# Patient Record
Sex: Male | Born: 1955 | Race: Black or African American | Hispanic: No | Smoking: Former smoker
Health system: Southern US, Community
[De-identification: ages and names within clinical notes are randomized; demographics above are authoritative.]

## PROBLEM LIST (undated history)

## (undated) DIAGNOSIS — M1990 Unspecified osteoarthritis, unspecified site: Secondary | ICD-10-CM

## (undated) DIAGNOSIS — G8929 Other chronic pain: Secondary | ICD-10-CM

## (undated) DIAGNOSIS — R519 Headache, unspecified: Secondary | ICD-10-CM

## (undated) HISTORY — DX: Headache, unspecified: R51.9

## (undated) HISTORY — DX: Unspecified osteoarthritis, unspecified site: M19.90

## (undated) HISTORY — DX: Other chronic pain: G89.29

---

## 2020-03-05 DIAGNOSIS — Z419 Encounter for procedure for purposes other than remedying health state, unspecified: Secondary | ICD-10-CM | POA: Diagnosis not present

## 2020-04-05 DIAGNOSIS — Z419 Encounter for procedure for purposes other than remedying health state, unspecified: Secondary | ICD-10-CM | POA: Diagnosis not present

## 2020-05-06 DIAGNOSIS — Z419 Encounter for procedure for purposes other than remedying health state, unspecified: Secondary | ICD-10-CM | POA: Diagnosis not present

## 2020-06-05 DIAGNOSIS — Z419 Encounter for procedure for purposes other than remedying health state, unspecified: Secondary | ICD-10-CM | POA: Diagnosis not present

## 2020-07-06 DIAGNOSIS — Z419 Encounter for procedure for purposes other than remedying health state, unspecified: Secondary | ICD-10-CM | POA: Diagnosis not present

## 2020-08-05 DIAGNOSIS — Z419 Encounter for procedure for purposes other than remedying health state, unspecified: Secondary | ICD-10-CM | POA: Diagnosis not present

## 2020-09-05 DIAGNOSIS — Z419 Encounter for procedure for purposes other than remedying health state, unspecified: Secondary | ICD-10-CM | POA: Diagnosis not present

## 2020-10-06 DIAGNOSIS — Z419 Encounter for procedure for purposes other than remedying health state, unspecified: Secondary | ICD-10-CM | POA: Diagnosis not present

## 2020-11-03 DIAGNOSIS — Z419 Encounter for procedure for purposes other than remedying health state, unspecified: Secondary | ICD-10-CM | POA: Diagnosis not present

## 2020-12-04 DIAGNOSIS — Z419 Encounter for procedure for purposes other than remedying health state, unspecified: Secondary | ICD-10-CM | POA: Diagnosis not present

## 2021-01-03 DIAGNOSIS — Z419 Encounter for procedure for purposes other than remedying health state, unspecified: Secondary | ICD-10-CM | POA: Diagnosis not present

## 2021-02-03 DIAGNOSIS — Z419 Encounter for procedure for purposes other than remedying health state, unspecified: Secondary | ICD-10-CM | POA: Diagnosis not present

## 2021-03-05 DIAGNOSIS — Z419 Encounter for procedure for purposes other than remedying health state, unspecified: Secondary | ICD-10-CM | POA: Diagnosis not present

## 2021-04-05 DIAGNOSIS — Z419 Encounter for procedure for purposes other than remedying health state, unspecified: Secondary | ICD-10-CM | POA: Diagnosis not present

## 2021-05-06 DIAGNOSIS — Z419 Encounter for procedure for purposes other than remedying health state, unspecified: Secondary | ICD-10-CM | POA: Diagnosis not present

## 2021-06-05 DIAGNOSIS — Z419 Encounter for procedure for purposes other than remedying health state, unspecified: Secondary | ICD-10-CM | POA: Diagnosis not present

## 2021-07-06 DIAGNOSIS — Z419 Encounter for procedure for purposes other than remedying health state, unspecified: Secondary | ICD-10-CM | POA: Diagnosis not present

## 2021-07-26 ENCOUNTER — Emergency Department (HOSPITAL_COMMUNITY): Payer: Medicaid Other

## 2021-07-26 ENCOUNTER — Inpatient Hospital Stay (HOSPITAL_COMMUNITY)
Admission: EM | Admit: 2021-07-26 | Discharge: 2021-07-28 | DRG: 247 | Disposition: A | Discharge status: Home / Self Care | Payer: Medicaid Other | Attending: Cardiovascular Disease | Admitting: Cardiovascular Disease

## 2021-07-26 ENCOUNTER — Other Ambulatory Visit: Payer: Self-pay

## 2021-07-26 DIAGNOSIS — I499 Cardiac arrhythmia, unspecified: Secondary | ICD-10-CM | POA: Diagnosis not present

## 2021-07-26 DIAGNOSIS — Z7984 Long term (current) use of oral hypoglycemic drugs: Secondary | ICD-10-CM

## 2021-07-26 DIAGNOSIS — I160 Hypertensive urgency: Secondary | ICD-10-CM | POA: Diagnosis present

## 2021-07-26 DIAGNOSIS — I214 Non-ST elevation (NSTEMI) myocardial infarction: Principal | ICD-10-CM | POA: Diagnosis present

## 2021-07-26 DIAGNOSIS — E1169 Type 2 diabetes mellitus with other specified complication: Secondary | ICD-10-CM | POA: Diagnosis present

## 2021-07-26 DIAGNOSIS — E785 Hyperlipidemia, unspecified: Secondary | ICD-10-CM | POA: Diagnosis present

## 2021-07-26 DIAGNOSIS — Z955 Presence of coronary angioplasty implant and graft: Secondary | ICD-10-CM

## 2021-07-26 DIAGNOSIS — I10 Essential (primary) hypertension: Secondary | ICD-10-CM | POA: Diagnosis present

## 2021-07-26 DIAGNOSIS — I255 Ischemic cardiomyopathy: Secondary | ICD-10-CM | POA: Diagnosis present

## 2021-07-26 DIAGNOSIS — F1490 Cocaine use, unspecified, uncomplicated: Secondary | ICD-10-CM | POA: Diagnosis present

## 2021-07-26 DIAGNOSIS — R6889 Other general symptoms and signs: Secondary | ICD-10-CM | POA: Diagnosis not present

## 2021-07-26 DIAGNOSIS — F17200 Nicotine dependence, unspecified, uncomplicated: Secondary | ICD-10-CM | POA: Diagnosis present

## 2021-07-26 DIAGNOSIS — E119 Type 2 diabetes mellitus without complications: Secondary | ICD-10-CM

## 2021-07-26 DIAGNOSIS — Z9861 Coronary angioplasty status: Secondary | ICD-10-CM

## 2021-07-26 DIAGNOSIS — Z20822 Contact with and (suspected) exposure to covid-19: Secondary | ICD-10-CM | POA: Diagnosis present

## 2021-07-26 DIAGNOSIS — Z743 Need for continuous supervision: Secondary | ICD-10-CM | POA: Diagnosis not present

## 2021-07-26 DIAGNOSIS — I152 Hypertension secondary to endocrine disorders: Secondary | ICD-10-CM | POA: Diagnosis present

## 2021-07-26 DIAGNOSIS — R079 Chest pain, unspecified: Secondary | ICD-10-CM | POA: Diagnosis not present

## 2021-07-26 DIAGNOSIS — Z79899 Other long term (current) drug therapy: Secondary | ICD-10-CM

## 2021-07-26 DIAGNOSIS — R0789 Other chest pain: Secondary | ICD-10-CM | POA: Diagnosis not present

## 2021-07-26 LAB — BASIC METABOLIC PANEL
Anion gap: 10 (ref 5–15)
BUN: 7 mg/dL — ABNORMAL LOW (ref 8–23)
CO2: 25 mmol/L (ref 22–32)
Calcium: 9.9 mg/dL (ref 8.9–10.3)
Chloride: 101 mmol/L (ref 98–111)
Creatinine, Ser: 1.02 mg/dL (ref 0.61–1.24)
GFR, Estimated: >60 mL/min (ref >60)
Glucose, Bld: 229 mg/dL — ABNORMAL HIGH (ref 70–99)
Potassium: 3.4 mmol/L — ABNORMAL LOW (ref 3.5–5.1)
Sodium: 136 mmol/L (ref 135–145)

## 2021-07-26 LAB — CBC
HCT: 48.7 % (ref 39.0–52.0)
Hemoglobin: 15.8 g/dL (ref 13.0–17.0)
MCH: 27.3 pg (ref 26.0–34.0)
MCHC: 32.4 g/dL (ref 30.0–36.0)
MCV: 84.3 fL (ref 80.0–100.0)
Platelets: 351 10*3/uL (ref 150–400)
RBC: 5.78 MIL/uL (ref 4.22–5.81)
RDW: 12.8 % (ref 11.5–15.5)
WBC: 7.5 10*3/uL (ref 4.0–10.5)
nRBC: 0 % (ref 0.0–0.2)

## 2021-07-26 LAB — CBG MONITORING, ED: Glucose-Capillary: 205 mg/dL — ABNORMAL HIGH (ref 70–99)

## 2021-07-26 LAB — RESP PANEL BY RT-PCR (FLU A&B, COVID) ARPGX2
Influenza A by PCR: NEGATIVE
Influenza B by PCR: NEGATIVE
SARS Coronavirus 2 by RT PCR: NEGATIVE

## 2021-07-26 LAB — TROPONIN I (HIGH SENSITIVITY)
Troponin I (High Sensitivity): 866 ng/L (ref <18)
Troponin I (High Sensitivity): 1987 ng/L (ref <18)

## 2021-07-26 MED ORDER — NITROGLYCERIN 0.4 MG SL SUBL: 0.4000 mg | SUBLINGUAL_TABLET | SUBLINGUAL | Status: DC | PRN

## 2021-07-26 MED ORDER — HEPARIN (PORCINE) 25000 UT/250ML-% IV SOLN
1000.0000 [IU]/h | INTRAVENOUS | Status: DC
  Administered 2021-07-26: 22:00:00 1000 [IU]/h via INTRAVENOUS
  Filled 2021-07-26: qty 250

## 2021-07-26 MED ORDER — NITROGLYCERIN IN D5W 200-5 MCG/ML-% IV SOLN
0.0000 ug/min | INTRAVENOUS | Status: DC
  Administered 2021-07-26: 5 ug/min via INTRAVENOUS
  Filled 2021-07-26: qty 250

## 2021-07-26 MED ORDER — HEPARIN BOLUS VIA INFUSION
4000.0000 [IU] | Freq: Once | INTRAVENOUS | Status: AC
  Administered 2021-07-26: 4000 [IU] via INTRAVENOUS
  Filled 2021-07-26: qty 4000

## 2021-07-26 NOTE — ED Provider Notes (Addendum)
Emergency Medicine Provider Triage Evaluation Note  Randy Sanchez , a 65 y.o. male  was evaluated in triage.  Pt complains of chest pain.  Started about 2 hours ago, came on suddenly and is constant.  Substernal, feels like a dull pressure.  There is no associated nausea or vomiting.  Was given aspirin and nitroglycerin in triage, no alleviation of symptoms.  Patient is supposed be taking medicine for diabetes hypertension and hypercholesterolemia.  Reports he has not taken metformin for the last 2 weeks or so, thinks the last time he took his cholesterol medicine was in 2018.Marland Kitchen  Review of Systems  Positive: CP  Negative: N,V  Physical Exam  BP (!) 210/124 (BP Location: Right Arm)   Pulse 89   Temp 97.8 F (36.6 C) (Oral)   Resp 16   SpO2 97%  Gen:   Awake, no distress   Resp:  Normal effort  MSK:   Moves extremities without difficulty  Other:  Radial pulse 2+ bilateral, no pitting edema.   Medical Decision Making  Medically screening exam initiated at 6:54 PM.  Appropriate orders placed.  Randy Sanchez was informed that the remainder of the evaluation will be completed by another provider, this initial triage assessment does not replace that evaluation, and the importance of remaining in the ED until their evaluation is complete.  Chest pain work-up   Sherrill Raring, PA-C 07/26/21 1855    Sherrill Raring, PA-C 07/26/21 1901    Regan Lemming, MD 07/26/21 [phone]

## 2021-07-26 NOTE — ED Triage Notes (Signed)
Pt here from EMS with c/o chest pain without radiation. Hypertensive 180/110. Have not taken BP meds for 2 weeks . EMS gave 324 ASA, 2 nitro without relief. 8/10 pain.

## 2021-07-26 NOTE — Progress Notes (Signed)
ANTICOAGULATION CONSULT NOTE - Initial Consult  Pharmacy Consult for heparin Indication: chest pain/ACS  Patient Measurements: Height: 5\' 7"  (170.2 cm) Weight: 81.6 kg (180 lb) IBW/kg (Calculated) : 66.1 Heparin Dosing Weight: 81.6 kg  Vital Signs: Temp: 97.6 F (36.4 C) (11/21 2050) Temp Source: Oral (11/21 1852) BP: 211/122 (11/21 2050) Pulse Rate: 86 (11/21 2050)  Labs: Recent Labs    07/26/21 1850  HGB 15.8  HCT 48.7  PLT 351  CREATININE 1.02  TROPONINIHS 866*    Estimated Creatinine Clearance: 75.8 mL/min (by C-G formula based on SCr of 1.02 mg/dL).   Medical History: No past medical history on file.  Medications: see MAR  Assessment: 65 yo M with elevated troponin. Cards consulted and heparin consult for [facility]. No AC PTA, CBC ok.  Goal of Therapy:  Heparin level 0.3-0.7 units/ml Monitor platelets by anticoagulation protocol: Yes   Plan:  Give 4000 units bolus x 1 Start heparin infusion at 1000 units/hr Check anti-Xa level in 6 hours and daily while on heparin Continue to monitor H&H and platelets  Joetta Manners, PharmD, [facility] Emergency Medicine Clinical Pharmacist ED RPh Phone: [place]: [phone]

## 2021-07-26 NOTE — ED Provider Notes (Signed)
[facility] EMERGENCY DEPARTMENT Provider Note   CSN: [id number] Arrival date & time: 07/26/21  1848     History Chief Complaint  Patient presents with   Chest Pain    Trop=866    Randy Sanchez is a 65 y.o. male.  Patient is a 65 year old male with a history of hypertension, hyperlipidemia, diabetes who occasionally uses tobacco products and rarely uses cocaine who is presenting today with the complaint of chest pain.  Patient reports that yesterday afternoon he was working and started noticing a pain in the center of his chest.  Currently the pain is a 4 out of 10.  He did not notice any radiation of the pain and did not have any shortness of breath, near syncope, dizziness, nausea or vomiting.  The pain has been present since that time but around 2:00 this afternoon when he was raking leaves and working on his truck the pain became much more severe.  He also noticed some numbness and tingling in his right arm.  No other associated symptoms.  He called his sister who told him he should call 911.  He has never had history of heart attack in the past and has no history of stents.  He does report that he did some partying last night and did use cocaine last night but he was having the pain before using cocaine.  He does take blood pressure and diabetes medicines but ran out 2 weeks ago and has not been able to get anywhere to get it refilled.  He reports that he does not have a regular vehicle [place]  He has to usually walk where he wants to go.  The history is provided by the patient.  Chest Pain     No past medical history on file.  There are no problems to display for this patient.        No family history on file.     Home Medications Prior to Admission medications   Not on File    Allergies    Patient has no allergy information on record.  Review of Systems   Review of Systems  Cardiovascular:  Positive for chest pain.   All other systems reviewed and are negative.  Physical Exam Updated Vital Signs BP (!) 211/122 (BP Location: Left Arm)   Pulse 86   Temp 97.6 F (36.4 C)   Resp 15   Ht 5\' 7"  (1.702 m)   Wt 81.6 kg   SpO2 100%   BMI 28.19 kg/m   Physical Exam Vitals and nursing note reviewed.  Constitutional:      General: He is not in acute distress.    Appearance: Normal appearance. He is well-developed.  HENT:     Head: Normocephalic and atraumatic.  Eyes:     Conjunctiva/sclera: Conjunctivae normal.     Pupils: Pupils are equal, round, and reactive to light.  Cardiovascular:     Rate and Rhythm: Normal rate and regular rhythm.     Heart sounds: No murmur heard. Pulmonary:     Effort: Pulmonary effort is normal. No respiratory distress.     Breath sounds: Normal breath sounds. No wheezing or rales.  Chest:     Chest wall: No tenderness.  Abdominal:     General: There is no distension.     Palpations: Abdomen is soft.     Tenderness: There is no abdominal tenderness. There is no guarding or rebound.  Musculoskeletal:  General: No tenderness. Normal range of motion.     Cervical back: Normal range of motion and neck supple.     Right lower leg: No edema.     Left lower leg: No edema.  Skin:    General: Skin is warm and dry.     Findings: No erythema or rash.  Neurological:     Mental Status: He is alert and oriented to person, place, and time. Mental status is at baseline.  Psychiatric:        Mood and Affect: Mood normal.        Behavior: Behavior normal.    ED Results / Procedures / Treatments   Labs (all labs ordered are listed, but only abnormal results are displayed) Labs Reviewed  BASIC METABOLIC PANEL - Abnormal; Notable for the following components:      Result Value   Potassium 3.4 (*)    Glucose, Bld 229 (*)    BUN 7 (*)    All other components within normal limits  CBG MONITORING, ED - Abnormal; Notable for the following components:    Glucose-Capillary 205 (*)    All other components within normal limits  TROPONIN I (HIGH SENSITIVITY) - Abnormal; Notable for the following components:   Troponin I (High Sensitivity) 866 (*)    All other components within normal limits  TROPONIN I (HIGH SENSITIVITY) - Abnormal; Notable for the following components:   Troponin I (High Sensitivity) 1,987 (*)    All other components within normal limits  RESP PANEL BY RT-PCR (FLU A&B, COVID) ARPGX2  CBC  CBC  HEPARIN LEVEL (UNFRACTIONATED)    EKG EKG Interpretation  Date/Time:  Monday July 26 2021 18:47:34 EST Ventricular Rate:  88 PR Interval:  184 QRS Duration: 92 QT Interval:  362 QTC Calculation: 438 R Axis:   30 Text Interpretation: Normal sinus rhythm with sinus arrhythmia ST & T wave abnormality, consider lateral ischemia No previous tracing Confirmed by Blanchie Dessert [phone]) on 07/26/2021 8:49:25 PM  Radiology DG Chest 2 View  Result Date: 07/26/2021 CLINICAL DATA:  Chest pain EXAM: CHEST - 2 VIEW COMPARISON:  None. FINDINGS: Cardiac shadow is within normal limits. Mild aortic calcifications are seen. The lungs are well aerated bilaterally. No focal infiltrate or effusion is noted. Old healed rib fractures on the right are noted. IMPRESSION: No acute abnormality noted. Electronically Signed   By: Inez Catalina M.D.   On: 07/26/2021 19:31    Procedures Procedures   Medications Ordered in ED Medications  nitroGLYCERIN (NITROSTAT) SL tablet 0.4 mg (has no administration in time range)  heparin bolus via infusion 4,000 Units (has no administration in time range)  heparin ADULT infusion 100 units/mL (25000 units/262mL) (has no administration in time range)    ED Course  I have reviewed the triage vital signs and the nursing notes.  Pertinent labs & imaging results that were available during my care of the patient were reviewed by me and considered in my medical decision making (see chart for details).    MDM  Rules/Calculators/A&P                           Patient is a 65 year old male presenting today with chest pain in the center who his chest.  This is in the setting of using cocaine last night, off blood pressure medications for the last 2 weeks.  Symptoms did start with exertion.  He does not have associated symptoms.  EKG  with nonspecific ST depression and T wave inversion anteriorly.  No old to compare.  CBC within normal limits today, BMP within normal limits except for hyperglycemia in the 200s and that is off medication for 2 weeks.  Troponin is elevated today at 866.  Patient is still currently having chest pain.  He is already taken 4 baby aspirin around 3 PM today.  We will give patient nitroglycerin which should help with his blood pressure and will attempt to get him chest pain-free.  Spoke with cardiology who will see the patient in consult.  Recommending getting a second troponin.  Most likely will need medicine admit given the hypertension and cocaine chest pain.  Pt started on heparin gtt.  11:12 PM Trops increasing to 2000.  Cards to see and admit.  MDM   Amount and/or Complexity of Data Reviewed Clinical lab tests: ordered and reviewed Tests in the radiology section of CPT: ordered and reviewed Tests in the medicine section of CPT: reviewed and ordered Decide to obtain previous medical records or to obtain history from someone other than the patient: yes Obtain history from someone other than the patient: yes Review and summarize past medical records: yes Discuss the patient with other providers: yes Independent visualization of images, tracings, or specimens: yes   CRITICAL CARE Performed by: Brian Kocourek Total critical care time: 30 minutes Critical care time was exclusive of separately billable procedures and treating other patients. Critical care was necessary to treat or prevent imminent or life-threatening deterioration. Critical care was time spent personally  by me on the following activities: development of treatment plan with patient and/or surrogate as well as nursing, discussions with consultants, evaluation of patient's response to treatment, examination of patient, obtaining history from patient or surrogate, ordering and performing treatments and interventions, ordering and review of laboratory studies, ordering and review of radiographic studies, pulse oximetry and re-evaluation of patient's condition.     Final Clinical Impression(s) / ED Diagnoses Final diagnoses:  NSTEMI (non-ST elevated myocardial infarction) [facility])  Hypertensive urgency    Rx / DC Orders ED Discharge Orders     None        Blanchie Dessert, MD 07/26/21 2312

## 2021-07-27 ENCOUNTER — Encounter (HOSPITAL_COMMUNITY)
Admission: EM | Disposition: A | Discharge status: Home / Self Care | Payer: Self-pay | Source: Home / Self Care | Attending: Internal Medicine

## 2021-07-27 ENCOUNTER — Inpatient Hospital Stay (HOSPITAL_COMMUNITY): Payer: Medicaid Other

## 2021-07-27 DIAGNOSIS — R079 Chest pain, unspecified: Secondary | ICD-10-CM | POA: Diagnosis not present

## 2021-07-27 DIAGNOSIS — E785 Hyperlipidemia, unspecified: Secondary | ICD-10-CM | POA: Diagnosis not present

## 2021-07-27 DIAGNOSIS — Z79899 Other long term (current) drug therapy: Secondary | ICD-10-CM | POA: Diagnosis not present

## 2021-07-27 DIAGNOSIS — I2510 Atherosclerotic heart disease of native coronary artery without angina pectoris: Secondary | ICD-10-CM

## 2021-07-27 DIAGNOSIS — Z7984 Long term (current) use of oral hypoglycemic drugs: Secondary | ICD-10-CM | POA: Diagnosis not present

## 2021-07-27 DIAGNOSIS — F1490 Cocaine use, unspecified, uncomplicated: Secondary | ICD-10-CM | POA: Diagnosis not present

## 2021-07-27 DIAGNOSIS — I160 Hypertensive urgency: Secondary | ICD-10-CM | POA: Diagnosis not present

## 2021-07-27 DIAGNOSIS — F17200 Nicotine dependence, unspecified, uncomplicated: Secondary | ICD-10-CM | POA: Diagnosis not present

## 2021-07-27 DIAGNOSIS — I214 Non-ST elevation (NSTEMI) myocardial infarction: Secondary | ICD-10-CM | POA: Diagnosis not present

## 2021-07-27 DIAGNOSIS — I255 Ischemic cardiomyopathy: Secondary | ICD-10-CM | POA: Diagnosis not present

## 2021-07-27 DIAGNOSIS — R7989 Other specified abnormal findings of blood chemistry: Secondary | ICD-10-CM | POA: Diagnosis not present

## 2021-07-27 DIAGNOSIS — I10 Essential (primary) hypertension: Secondary | ICD-10-CM | POA: Diagnosis not present

## 2021-07-27 DIAGNOSIS — E119 Type 2 diabetes mellitus without complications: Secondary | ICD-10-CM | POA: Diagnosis not present

## 2021-07-27 DIAGNOSIS — E1169 Type 2 diabetes mellitus with other specified complication: Secondary | ICD-10-CM | POA: Diagnosis not present

## 2021-07-27 DIAGNOSIS — Z20822 Contact with and (suspected) exposure to covid-19: Secondary | ICD-10-CM | POA: Diagnosis not present

## 2021-07-27 HISTORY — DX: Non-ST elevation (NSTEMI) myocardial infarction: I21.4

## 2021-07-27 HISTORY — PX: CORONARY STENT INTERVENTION: CATH118234

## 2021-07-27 HISTORY — PX: LEFT HEART CATH AND CORONARY ANGIOGRAPHY: CATH118249

## 2021-07-27 LAB — HEMOGLOBIN A1C
Hgb A1c MFr Bld: 8.2 % — ABNORMAL HIGH (ref 4.8–5.6)
Mean Plasma Glucose: 188.64 mg/dL

## 2021-07-27 LAB — ECHOCARDIOGRAM COMPLETE
AR max vel: 2.16 cm2
AV Area VTI: 2.35 cm2
AV Area mean vel: 2.08 cm2
AV Mean grad: 2 mmHg
AV Peak grad: 3.5 mmHg
Ao pk vel: 0.93 m/s
Area-P 1/2: 3.83 cm2
Calc EF: 38.3 %
Height: 67 in
S' Lateral: 3 cm
Single Plane A2C EF: 41.7 %
Single Plane A4C EF: 36.8 %
Weight: 2880 oz

## 2021-07-27 LAB — BASIC METABOLIC PANEL
Anion gap: 9 (ref 5–15)
BUN: 7 mg/dL — ABNORMAL LOW (ref 8–23)
CO2: 22 mmol/L (ref 22–32)
Calcium: 9.4 mg/dL (ref 8.9–10.3)
Chloride: 103 mmol/L (ref 98–111)
Creatinine, Ser: 1.01 mg/dL (ref 0.61–1.24)
GFR, Estimated: >60 mL/min (ref >60)
Glucose, Bld: 210 mg/dL — ABNORMAL HIGH (ref 70–99)
Potassium: 3.6 mmol/L (ref 3.5–5.1)
Sodium: 134 mmol/L — ABNORMAL LOW (ref 135–145)

## 2021-07-27 LAB — LIPID PANEL
Cholesterol: 245 mg/dL — ABNORMAL HIGH (ref 0–200)
HDL: 47 mg/dL (ref >40)
LDL Cholesterol: 180 mg/dL — ABNORMAL HIGH (ref 0–99)
Total CHOL/HDL Ratio: 5.2 RATIO
Triglycerides: 91 mg/dL (ref <150)
VLDL: 18 mg/dL (ref 0–40)

## 2021-07-27 LAB — POCT ACTIVATED CLOTTING TIME
Activated Clotting Time: 376 seconds
Activated Clotting Time: 329 seconds

## 2021-07-27 LAB — GLUCOSE, CAPILLARY
Glucose-Capillary: 146 mg/dL — ABNORMAL HIGH (ref 70–99)
Glucose-Capillary: 280 mg/dL — ABNORMAL HIGH (ref 70–99)

## 2021-07-27 LAB — HIV ANTIBODY (ROUTINE TESTING W REFLEX): HIV Screen 4th Generation wRfx: NONREACTIVE

## 2021-07-27 LAB — CBC
HCT: 44.6 % (ref 39.0–52.0)
Hemoglobin: 15.4 g/dL (ref 13.0–17.0)
MCH: 28.3 pg (ref 26.0–34.0)
MCHC: 34.5 g/dL (ref 30.0–36.0)
MCV: 82 fL (ref 80.0–100.0)
Platelets: 317 10*3/uL (ref 150–400)
RBC: 5.44 MIL/uL (ref 4.22–5.81)
RDW: 12.9 % (ref 11.5–15.5)
WBC: 8.1 10*3/uL (ref 4.0–10.5)
nRBC: 0 % (ref 0.0–0.2)

## 2021-07-27 LAB — HEPARIN LEVEL (UNFRACTIONATED): Heparin Unfractionated: 0.33 IU/mL (ref 0.30–0.70)

## 2021-07-27 LAB — BRAIN NATRIURETIC PEPTIDE: B Natriuretic Peptide: 791.2 pg/mL — ABNORMAL HIGH (ref 0.0–100.0)

## 2021-07-27 LAB — TROPONIN I (HIGH SENSITIVITY)
Troponin I (High Sensitivity): 6936 ng/L (ref <18)
Troponin I (High Sensitivity): >24000 ng/L (ref <18)

## 2021-07-27 SURGERY — LEFT HEART CATH AND CORONARY ANGIOGRAPHY
Anesthesia: LOCAL

## 2021-07-27 MED ORDER — SODIUM CHLORIDE 0.9% FLUSH: 3.0000 mL | INTRAVENOUS | Status: DC | PRN

## 2021-07-27 MED ORDER — FENTANYL CITRATE (PF) 100 MCG/2ML IJ SOLN
INTRAMUSCULAR | Status: AC
  Filled 2021-07-27: qty 2

## 2021-07-27 MED ORDER — VERAPAMIL HCL 2.5 MG/ML IV SOLN
INTRAVENOUS | Status: AC
  Filled 2021-07-27: qty 2

## 2021-07-27 MED ORDER — ONDANSETRON HCL 4 MG/2ML IJ SOLN: 4.0000 mg | Freq: Four times a day (QID) | INTRAMUSCULAR | Status: DC | PRN

## 2021-07-27 MED ORDER — MIDAZOLAM HCL 2 MG/2ML IJ SOLN
INTRAMUSCULAR | Status: DC | PRN
  Administered 2021-07-27: 2 mg via INTRAVENOUS

## 2021-07-27 MED ORDER — VERAPAMIL HCL 2.5 MG/ML IV SOLN
INTRAVENOUS | Status: DC | PRN
  Administered 2021-07-27: 10 mL via INTRA_ARTERIAL

## 2021-07-27 MED ORDER — AMLODIPINE BESYLATE 2.5 MG PO TABS
2.5000 mg | ORAL_TABLET | Freq: Every day | ORAL | Status: DC
  Administered 2021-07-27 – 2021-07-28 (×2): 2.5 mg via ORAL
  Filled 2021-07-27 (×2): qty 1

## 2021-07-27 MED ORDER — SODIUM CHLORIDE 0.9% FLUSH
3.0000 mL | Freq: Two times a day (BID) | INTRAVENOUS | Status: DC
  Administered 2021-07-27 – 2021-07-28 (×2): 3 mL via INTRAVENOUS

## 2021-07-27 MED ORDER — IOHEXOL 350 MG/ML SOLN
INTRAVENOUS | Status: DC | PRN
  Administered 2021-07-27: 170 mL

## 2021-07-27 MED ORDER — TICAGRELOR 90 MG PO TABS
90.0000 mg | ORAL_TABLET | Freq: Two times a day (BID) | ORAL | Status: DC
  Administered 2021-07-27 – 2021-07-28 (×2): 90 mg via ORAL
  Filled 2021-07-27 (×2): qty 1

## 2021-07-27 MED ORDER — FENTANYL CITRATE (PF) 100 MCG/2ML IJ SOLN
INTRAMUSCULAR | Status: DC | PRN
  Administered 2021-07-27: 25 ug via INTRAVENOUS

## 2021-07-27 MED ORDER — LIDOCAINE HCL (PF) 1 % IJ SOLN
INTRAMUSCULAR | Status: AC
  Filled 2021-07-27: qty 30

## 2021-07-27 MED ORDER — NITROGLYCERIN 1 MG/10 ML FOR IR/CATH LAB
INTRA_ARTERIAL | Status: AC
  Filled 2021-07-27: qty 10

## 2021-07-27 MED ORDER — TICAGRELOR 90 MG PO TABS
ORAL_TABLET | ORAL | Status: AC
  Filled 2021-07-27: qty 2

## 2021-07-27 MED ORDER — INSULIN ASPART 100 UNIT/ML IJ SOLN
0.0000 [IU] | Freq: Three times a day (TID) | INTRAMUSCULAR | Status: DC
  Administered 2021-07-27: 2 [IU] via SUBCUTANEOUS
  Administered 2021-07-28 (×2): 5 [IU] via SUBCUTANEOUS

## 2021-07-27 MED ORDER — SODIUM CHLORIDE 0.9 % IV SOLN: 250.0000 mL | INTRAVENOUS | Status: DC | PRN

## 2021-07-27 MED ORDER — MIDAZOLAM HCL 2 MG/2ML IJ SOLN
INTRAMUSCULAR | Status: AC
  Filled 2021-07-27: qty 2

## 2021-07-27 MED ORDER — ASPIRIN 81 MG PO CHEW: 324.0000 mg | CHEWABLE_TABLET | ORAL | Status: AC

## 2021-07-27 MED ORDER — ASPIRIN EC 81 MG PO TBEC
81.0000 mg | DELAYED_RELEASE_TABLET | Freq: Every day | ORAL | Status: DC
  Administered 2021-07-28: 81 mg via ORAL
  Filled 2021-07-27: qty 1

## 2021-07-27 MED ORDER — HEPARIN (PORCINE) IN NACL 1000-0.9 UT/500ML-% IV SOLN
INTRAVENOUS | Status: AC
  Filled 2021-07-27: qty 1000

## 2021-07-27 MED ORDER — ATORVASTATIN CALCIUM 80 MG PO TABS
80.0000 mg | ORAL_TABLET | Freq: Every day | ORAL | Status: DC
  Administered 2021-07-27 – 2021-07-28 (×2): 80 mg via ORAL
  Filled 2021-07-27: qty 2
  Filled 2021-07-27: qty 1

## 2021-07-27 MED ORDER — NITROGLYCERIN 0.4 MG SL SUBL: 0.4000 mg | SUBLINGUAL_TABLET | SUBLINGUAL | Status: DC | PRN

## 2021-07-27 MED ORDER — HEPARIN SODIUM (PORCINE) 1000 UNIT/ML IJ SOLN
INTRAMUSCULAR | Status: AC
  Filled 2021-07-27: qty 1

## 2021-07-27 MED ORDER — ACETAMINOPHEN 325 MG PO TABS: 650.0000 mg | ORAL_TABLET | ORAL | Status: DC | PRN

## 2021-07-27 MED ORDER — CARVEDILOL 6.25 MG PO TABS
6.2500 mg | ORAL_TABLET | Freq: Two times a day (BID) | ORAL | Status: DC
  Administered 2021-07-27 – 2021-07-28 (×3): 6.25 mg via ORAL
  Filled 2021-07-27 (×2): qty 1
  Filled 2021-07-27: qty 2

## 2021-07-27 MED ORDER — HEPARIN SODIUM (PORCINE) 1000 UNIT/ML IJ SOLN
INTRAMUSCULAR | Status: DC | PRN
  Administered 2021-07-27 (×2): 4000 [IU] via INTRAVENOUS

## 2021-07-27 MED ORDER — LABETALOL HCL 5 MG/ML IV SOLN: 10.0000 mg | INTRAVENOUS | Status: AC | PRN

## 2021-07-27 MED ORDER — NITROGLYCERIN 1 MG/10 ML FOR IR/CATH LAB
INTRA_ARTERIAL | Status: DC | PRN
  Administered 2021-07-27 (×2): 200 ug
  Administered 2021-07-27: 200 ug via INTRACORONARY
  Administered 2021-07-27: 200 ug via INTRA_ARTERIAL
  Administered 2021-07-27: 200 ug
  Administered 2021-07-27: 200 ug via INTRACORONARY

## 2021-07-27 MED ORDER — HYDRALAZINE HCL 20 MG/ML IJ SOLN: 10.0000 mg | INTRAMUSCULAR | Status: AC | PRN

## 2021-07-27 MED ORDER — ASPIRIN 81 MG PO CHEW
81.0000 mg | CHEWABLE_TABLET | ORAL | Status: AC
  Administered 2021-07-27: 81 mg via ORAL
  Filled 2021-07-27: qty 1

## 2021-07-27 MED ORDER — SODIUM CHLORIDE 0.9 % WEIGHT BASED INFUSION
1.0000 mL/kg/h | INTRAVENOUS | Status: DC
  Administered 2021-07-27: 1 mL/kg/h via INTRAVENOUS

## 2021-07-27 MED ORDER — ACETAMINOPHEN 325 MG PO TABS
650.0000 mg | ORAL_TABLET | ORAL | Status: DC | PRN
  Administered 2021-07-27 – 2021-07-28 (×2): 650 mg via ORAL
  Filled 2021-07-27 (×2): qty 2

## 2021-07-27 MED ORDER — HEPARIN (PORCINE) IN NACL 1000-0.9 UT/500ML-% IV SOLN
INTRAVENOUS | Status: DC | PRN
  Administered 2021-07-27 (×2): 500 mL

## 2021-07-27 MED ORDER — SODIUM CHLORIDE 0.9 % IV SOLN: INTRAVENOUS | Status: AC

## 2021-07-27 MED ORDER — SODIUM CHLORIDE 0.9 % WEIGHT BASED INFUSION
3.0000 mL/kg/h | INTRAVENOUS | Status: DC
  Administered 2021-07-27: 3 mL/kg/h via INTRAVENOUS

## 2021-07-27 MED ORDER — LIDOCAINE HCL (PF) 1 % IJ SOLN
INTRAMUSCULAR | Status: DC | PRN
  Administered 2021-07-27: 2 mL

## 2021-07-27 MED ORDER — TICAGRELOR 90 MG PO TABS
ORAL_TABLET | ORAL | Status: DC | PRN
  Administered 2021-07-27: 180 mg via ORAL

## 2021-07-27 MED ORDER — ASPIRIN 300 MG RE SUPP: 300.0000 mg | RECTAL | Status: AC

## 2021-07-27 SURGICAL SUPPLY — 19 items
BALLN SAPPHIRE 2.0X15 (BALLOONS) ×2
BALLN SAPPHIRE ~~LOC~~ 3.25X10 (BALLOONS) ×2 IMPLANT
BALLOON SAPPHIRE 2.0X15 (BALLOONS) ×1 IMPLANT
CATH INFINITI 5FR MULTPACK ANG (CATHETERS) ×2 IMPLANT
CATH LAUNCHER 6FR EBU 3.5 SH (CATHETERS) ×2 IMPLANT
DEVICE RAD TR BAND REGULAR (VASCULAR PRODUCTS) ×2 IMPLANT
ELECT DEFIB PAD ADLT CADENCE (PAD) ×2 IMPLANT
GLIDESHEATH SLEND SS 6F .021 (SHEATH) ×2 IMPLANT
GUIDEWIRE INQWIRE 1.5J.035X260 (WIRE) ×1 IMPLANT
INQWIRE 1.5J .035X260CM (WIRE) ×2
KIT ENCORE 26 ADVANTAGE (KITS) ×2 IMPLANT
KIT HEART LEFT (KITS) ×2 IMPLANT
KIT HEMO VALVE WATCHDOG (MISCELLANEOUS) ×2 IMPLANT
PACK CARDIAC CATHETERIZATION (CUSTOM PROCEDURE TRAY) ×2 IMPLANT
STENT ONYX FRONTIER 3.0X15 (Permanent Stent) ×2 IMPLANT
TRANSDUCER W/STOPCOCK (MISCELLANEOUS) ×2 IMPLANT
TUBING CIL FLEX 10 FLL-RA (TUBING) ×2 IMPLANT
WIRE ASAHI PROWATER 180CM (WIRE) ×2 IMPLANT
WIRE HI TORQ BMW 190CM (WIRE) ×2 IMPLANT

## 2021-07-27 NOTE — Progress Notes (Signed)
Progress Note  Patient Name: Randy Sanchez Date of Encounter: 07/27/2021  [facility] Cardiologist: Skeet Latch, MD new  Subjective   No CP overnight, resting pretty comfortably  Inpatient Medications    Scheduled Meds:  aspirin  324 mg Oral NOW   Or   aspirin  300 mg Rectal NOW   [START ON 07/28/2021] aspirin EC  81 mg Oral Daily   atorvastatin  80 mg Oral Daily   carvedilol  6.25 mg Oral BID WC   Continuous Infusions:  heparin 1,000 Units/hr (07/26/21 2211)   nitroGLYCERIN 15 mcg/min (07/27/21 0250)   PRN Meds: acetaminophen, nitroGLYCERIN, ondansetron (ZOFRAN) IV   Vital Signs    Vitals:   07/27/21 0700 07/27/21 0715 07/27/21 0730 07/27/21 0741  BP: 139/86 118/80 (!) 153/85   Pulse:  75 69 81  Resp: 12 17 20    Temp:      TempSrc:      SpO2: 97% 97% 95%   Weight:      Height:       No intake or output data in the 24 hours ending 07/27/21 0758 Last 3 Weights 07/26/2021  Weight (lbs) 180 lb  Weight (kg) 81.647 kg      Telemetry    SR, 2 brief episodes of ?atrial tach w/ HR 133 rapid but regular - Personally Reviewed  ECG    SR, HR 72, lateral T wave inversions not seen on admit - Personally Reviewed  Physical Exam   GEN: No acute distress.   Neck: No JVD Cardiac: RRR, no murmurs, rubs, or gallops.  Respiratory: Clear to auscultation bilaterally. GI: Soft, nontender, non-distended  MS: No edema; No deformity. Neuro:  Nonfocal  Psych: Normal affect   Labs    High Sensitivity Troponin:   Recent Labs  Lab 07/26/21 1850 07/26/21 2216 07/27/21 0646  TROPONINIHS 866* 1,987* 6,936*     Chemistry Recent Labs  Lab 07/26/21 1850 07/27/21 0646  NA 136 134*  K 3.4* 3.6  CL 101 103  CO2 25 22  GLUCOSE 229* 210*  BUN 7* 7*  CREATININE 1.02 1.01  CALCIUM 9.9 9.4  GFRNONAA >60 >60  ANIONGAP 10 9    Lipids  Recent Labs  Lab 07/27/21 0646  CHOL 245*  TRIG 91  HDL 47  LDLCALC 180*  CHOLHDL 5.2    Hematology Recent Labs   Lab 07/26/21 1850 07/27/21 0311  WBC 7.5 8.1  RBC 5.78 5.44  HGB 15.8 15.4  HCT 48.7 44.6  MCV 84.3 82.0  MCH 27.3 28.3  MCHC 32.4 34.5  RDW 12.8 12.9  PLT 351 317   Thyroid No results for input(s): TSH, FREET4 in the last 168 hours.  BNP Recent Labs  Lab 07/27/21 0646  BNP 791.2*    DDimer No results for input(s): DDIMER in the last 168 hours.  Lab Results  Component Value Date   HGBA1C 8.2 (H) 07/27/2021   No results found for: [facility]   Radiology    DG Chest 2 View  Result Date: 07/26/2021 CLINICAL DATA:  Chest pain EXAM: CHEST - 2 VIEW COMPARISON:  None. FINDINGS: Cardiac shadow is within normal limits. Mild aortic calcifications are seen. The lungs are well aerated bilaterally. No focal infiltrate or effusion is noted. Old healed rib fractures on the right are noted. IMPRESSION: No acute abnormality noted. Electronically Signed   By: Inez Catalina M.D.   On: 07/26/2021 19:31    Cardiac Studies   ECHO: Ordered CATH: put on board  Patient Profile     65 y.o. male w/ no recent medical care and hx HTN, DM was admitted 11/22 w/ NSTEMI  Assessment & Plan    NSTEMI - hs trop almost 7,000. - pain-free on IV heparin and nitro - Cardiac catheterization was discussed with the patient fully. The patient understands that risks include but are not limited to stroke (1 in 1000), death (1 in 48), kidney failure [usually temporary] (1 in 500), bleeding (1 in 200), allergic reaction [possibly serious] (1 in 200).  The patient understands and is willing to proceed.   - has been started on high-dose statin and BB  2. DM - not on meds PTA - diabetes coordinator to see and make med recs  3. HLD - continue high-dose Lipitor - recheck labs in 6-8 weeks  4. HTN - BP elevated most of the time, Coreg 6.25 mg bid started last pm - add amlodipine 2.5 mg qd >> increase as needed  For questions or updates, please contact [place] Please consult [email or web address] for contact  info under        Signed, Rosaria Ferries, PA-C  07/27/2021, 7:58 AM

## 2021-07-27 NOTE — H&P (View-Only) (Signed)
Progress Note  Patient Name: Randy Sanchez Date of Encounter: 07/27/2021  [facility] Cardiologist: Skeet Latch, MD new  Subjective   No CP overnight, resting pretty comfortably  Inpatient Medications    Scheduled Meds:  aspirin  324 mg Oral NOW   Or   aspirin  300 mg Rectal NOW   [START ON 07/28/2021] aspirin EC  81 mg Oral Daily   atorvastatin  80 mg Oral Daily   carvedilol  6.25 mg Oral BID WC   Continuous Infusions:  heparin 1,000 Units/hr (07/26/21 2211)   nitroGLYCERIN 15 mcg/min (07/27/21 0250)   PRN Meds: acetaminophen, nitroGLYCERIN, ondansetron (ZOFRAN) IV   Vital Signs    Vitals:   07/27/21 0700 07/27/21 0715 07/27/21 0730 07/27/21 0741  BP: 139/86 118/80 (!) 153/85   Pulse:  75 69 81  Resp: 12 17 20    Temp:      TempSrc:      SpO2: 97% 97% 95%   Weight:      Height:       No intake or output data in the 24 hours ending 07/27/21 0758 Last 3 Weights 07/26/2021  Weight (lbs) 180 lb  Weight (kg) 81.647 kg      Telemetry    SR, 2 brief episodes of ?atrial tach w/ HR 133 rapid but regular - Personally Reviewed  ECG    SR, HR 72, lateral T wave inversions not seen on admit - Personally Reviewed  Physical Exam   GEN: No acute distress.   Neck: No JVD Cardiac: RRR, no murmurs, rubs, or gallops.  Respiratory: Clear to auscultation bilaterally. GI: Soft, nontender, non-distended  MS: No edema; No deformity. Neuro:  Nonfocal  Psych: Normal affect   Labs    High Sensitivity Troponin:   Recent Labs  Lab 07/26/21 1850 07/26/21 2216 07/27/21 0646  TROPONINIHS 866* 1,987* 6,936*     Chemistry Recent Labs  Lab 07/26/21 1850 07/27/21 0646  NA 136 134*  K 3.4* 3.6  CL 101 103  CO2 25 22  GLUCOSE 229* 210*  BUN 7* 7*  CREATININE 1.02 1.01  CALCIUM 9.9 9.4  GFRNONAA >60 >60  ANIONGAP 10 9    Lipids  Recent Labs  Lab 07/27/21 0646  CHOL 245*  TRIG 91  HDL 47  LDLCALC 180*  CHOLHDL 5.2    Hematology Recent Labs   Lab 07/26/21 1850 07/27/21 0311  WBC 7.5 8.1  RBC 5.78 5.44  HGB 15.8 15.4  HCT 48.7 44.6  MCV 84.3 82.0  MCH 27.3 28.3  MCHC 32.4 34.5  RDW 12.8 12.9  PLT 351 317   Thyroid No results for input(s): TSH, FREET4 in the last 168 hours.  BNP Recent Labs  Lab 07/27/21 0646  BNP 791.2*    DDimer No results for input(s): DDIMER in the last 168 hours.  Lab Results  Component Value Date   HGBA1C 8.2 (H) 07/27/2021   No results found for: [facility]   Radiology    DG Chest 2 View  Result Date: 07/26/2021 CLINICAL DATA:  Chest pain EXAM: CHEST - 2 VIEW COMPARISON:  None. FINDINGS: Cardiac shadow is within normal limits. Mild aortic calcifications are seen. The lungs are well aerated bilaterally. No focal infiltrate or effusion is noted. Old healed rib fractures on the right are noted. IMPRESSION: No acute abnormality noted. Electronically Signed   By: Inez Catalina M.D.   On: 07/26/2021 19:31    Cardiac Studies   ECHO: Ordered CATH: put on board  Patient Profile     65 y.o. male w/ no recent medical care and hx HTN, DM was admitted 11/22 w/ NSTEMI  Assessment & Plan    NSTEMI - hs trop almost 7,000. - pain-free on IV heparin and nitro - Cardiac catheterization was discussed with the patient fully. The patient understands that risks include but are not limited to stroke (1 in 1000), death (1 in 35), kidney failure [usually temporary] (1 in 500), bleeding (1 in 200), allergic reaction [possibly serious] (1 in 200).  The patient understands and is willing to proceed.   - has been started on high-dose statin and BB  2. DM - not on meds PTA - diabetes coordinator to see and make med recs  3. HLD - continue high-dose Lipitor - recheck labs in 6-8 weeks  4. HTN - BP elevated most of the time, Coreg 6.25 mg bid started last pm - add amlodipine 2.5 mg qd >> increase as needed  For questions or updates, please contact [place] Please consult [email or web address] for contact  info under        Signed, Rosaria Ferries, PA-C  07/27/2021, 7:58 AM

## 2021-07-27 NOTE — Progress Notes (Signed)
Troponin =>.24,000 .MDon call Dr.Ugowe made aware.

## 2021-07-27 NOTE — Interval H&P Note (Signed)
Cath Lab Visit (complete for each Cath Lab visit)  Clinical Evaluation Leading to the Procedure:   ACS: Yes.    Non-ACS:    Anginal Classification: CCS IV  Anti-ischemic medical therapy: Minimal Therapy (1 class of medications)  Non-Invasive Test Results: No non-invasive testing performed  Prior CABG: No previous CABG      History and Physical Interval Note:  07/27/2021 3:26 PM  Randy Sanchez  has presented today for surgery, with the diagnosis of non stemi.  The various methods of treatment have been discussed with the patient and family. After consideration of risks, benefits and other options for treatment, the patient has consented to  Procedure(s): LEFT HEART CATH AND CORONARY ANGIOGRAPHY (N/A) as a surgical intervention.  The patient's history has been reviewed, patient examined, no change in status, stable for surgery.  I have reviewed the patient's chart and labs.  Questions were answered to the patient's satisfaction.     Larae Grooms

## 2021-07-27 NOTE — ED Notes (Signed)
Santiago Glad friend [phone] requesting to speak to the patient

## 2021-07-27 NOTE — Progress Notes (Signed)
ANTICOAGULATION CONSULT NOTE   Pharmacy Consult for heparin Indication: chest pain/ACS  Patient Measurements: Height: 5\' 7"  (170.2 cm) Weight: 81.6 kg (180 lb) IBW/kg (Calculated) : 66.1 Heparin Dosing Weight: 81.6 kg  Vital Signs: Temp: 97.6 F (36.4 C) (11/21 2050) BP: 119/89 (11/22 0645) Pulse Rate: 84 (11/22 0645)  Labs: Recent Labs    07/26/21 1850 07/26/21 2216 07/27/21 0311  HGB 15.8  --  15.4  HCT 48.7  --  44.6  PLT 351  --  317  HEPARINUNFRC  --   --  0.33  CREATININE 1.02  --   --   TROPONINIHS 866* 1,987*  --      Estimated Creatinine Clearance: 75.8 mL/min (by C-G formula based on SCr of 1.02 mg/dL).   Medical History: No past medical history on file.  Medications: see MAR  Assessment: 65 yo M with elevated troponin. Cards consulted and heparin consult for [facility]. No AC PTA, CBC ok.  Initial heparin level therapeutic on 1000 units/hr, cards plan for cath  Goal of Therapy:  Heparin level 0.3-0.7 units/ml Monitor platelets by anticoagulation protocol: Yes   Plan:  Continue heparin gtt at 1000 units/hr Daily heparin level, CBC, s/s bleeding F/u cath plans  Bertis Ruddy, PharmD Clinical Pharmacist ED Pharmacist Phone # [phone] 07/27/2021 7:14 AM

## 2021-07-27 NOTE — ED Notes (Signed)
Admit provider at bedside 

## 2021-07-27 NOTE — Plan of Care (Signed)
  Problem: Clinical Measurements: Goal: Respiratory complications will improve Outcome: Progressing   Problem: Nutrition: Goal: Adequate nutrition will be maintained Outcome: Progressing   Problem: Coping: Goal: Level of anxiety will decrease Outcome: Progressing   

## 2021-07-27 NOTE — H&P (Signed)
Cardiology Admission History and Physical:   Patient ID: Randy Sanchez MRN: [id number]; DOB: 03/23/1956   Admission date: 07/26/2021  PCP:  Pcp, No   [facility] Providers Cardiologist:  None        Chief Complaint:  Chest pain  Patient Profile:   Randy Sanchez is a 65 y.o. male with hypertension and diabetes who is being seen 07/27/2021 for the evaluation of chest pain.  History of Present Illness:   Randy Sanchez is a 65 year old male with a history of hypertension and diabetes who presents for evaluation of chest pain.  He was doing pretty well until Sunday when he started having occasional chest pains.  He then had "some girls" over the house and had a night of partying where he indulged in cocaine (he claims this is a very occasional event for him).  Upon waking this morning he was in his usual st ate of health and went to do some work in the yard.  During light activity (raking) he developed substernal chest pain that was much worse than he'd had the day prior.  This provoked him to present tot he ED where he had persistent pain and received nitroglycerin sublingually.  He was started on 10 mcg/min of nitroglycerin and is currently chest pain free.  He's been taking diabetes and hypertension meds for a couple of years but ran out two weeks ago and has not been taking them.  He reassures me he would be adherent to antiplatelets if necessary.  Denies orthopnea, PND, exertional dyspnea, palpitations, syncope.  Troponin trended 800-->2000.  He is a very occasional smoker.  PMHx includes HTN, diabetes  Medications Prior to Admission: None taking  Allergies:   Not on File  Social History:   Social History   Socioeconomic History   Marital status: Widowed    Spouse name: Not on file   Number of children: Not on file   Years of education: Not on file   Highest education level: Not on file  Occupational History   Not on file  Tobacco Use   Smoking status: Not on file    Smokeless tobacco: Not on file  Substance and Sexual Activity   Alcohol use: Not on file   Drug use: Not on file   Sexual activity: Not on file  Other Topics Concern   Not on file  Social History Narrative   Not on file   Social Determinants of Health   Financial Resource Strain: Not on file  Food Insecurity: Not on file  Transportation Needs: Not on file  Physical Activity: Not on file  Stress: Not on file  Social Connections: Not on file  Intimate Partner Violence: Not on file    Family History:   Multiple family members with strokes but no coronary disease he can recall. The patient's family history is not on file.    ROS:  Please see the history of present illness.  All other ROS reviewed and negative.     Physical Exam/Data:   Vitals:   07/26/21 2245 07/26/21 2330 07/27/21 0000 07/27/21 0045  BP: (!) 175/98 (!) 175/99 (!) 174/102 (!) 166/92  Pulse: 73 78 80 (!) 143  Resp: 14 20 18 13   Temp:      TempSrc:      SpO2: 99% 97% 97% 98%  Weight:      Height:       No intake or output data in the 24 hours ending 07/27/21 0306 Last 3 Weights  07/26/2021  Weight (lbs) 180 lb  Weight (kg) 81.647 kg     Body mass index is 28.19 kg/m.  General:  Well nourished, well developed, in no acute distress HEENT: normal Neck: no JVD Vascular: No carotid bruits; Distal pulses 2+ bilaterally   Cardiac:  normal S1, S2; RRR; no murmur  Lungs:  clear to auscultation bilaterally, no wheezing, rhonchi or rales  Abd: soft, nontender, NABS  Ext: no edema Musculoskeletal:  No deformities, BUE and BLE strength normal and equal Skin: warm and dry  Neuro:  CNs 2-12 intact, no focal abnormalities noted Psych:  Normal affect   EKG:  NSR with biphasic T-wave abnormalities laterally, no priors for comparison.  Relevant CV Studies: None  Laboratory Data:  High Sensitivity Troponin:   Recent Labs  Lab 07/26/21 1850 07/26/21 2216  TROPONINIHS 866* 1,987*       Chemistry Recent Labs  Lab 07/26/21 1850  NA 136  K 3.4*  CL 101  CO2 25  GLUCOSE 229*  BUN 7*  CREATININE 1.02  CALCIUM 9.9  GFRNONAA >60  ANIONGAP 10    No results for input(s): PROT, ALBUMIN, AST, ALT, ALKPHOS, BILITOT in the last 168 hours. Lipids No results for input(s): CHOL, TRIG, HDL, LABVLDL, LDLCALC, CHOLHDL in the last 168 hours. Hematology Recent Labs  Lab 07/26/21 1850  WBC 7.5  RBC 5.78  HGB 15.8  HCT 48.7  MCV 84.3  MCH 27.3  MCHC 32.4  RDW 12.8  PLT 351   Thyroid No results for input(s): TSH, FREET4 in the last 168 hours. BNPNo results for input(s): BNP, PROBNP in the last 168 hours.  DDimer No results for input(s): DDIMER in the last 168 hours.   Radiology/Studies:  DG Chest 2 View  Result Date: 07/26/2021 CLINICAL DATA:  Chest pain EXAM: CHEST - 2 VIEW COMPARISON:  None. FINDINGS: Cardiac shadow is within normal limits. Mild aortic calcifications are seen. The lungs are well aerated bilaterally. No focal infiltrate or effusion is noted. Old healed rib fractures on the right are noted. IMPRESSION: No acute abnormality noted. Electronically Signed   By: Inez Catalina M.D.   On: 07/26/2021 19:31     Assessment and Plan:   65 year old male with history of hypertension and diabetes presenting with chest pain and elevated troponin consistent with acute coronary syndrome.  This occurs in the context of recent cocaine use.  He seems reliable enough and would favor invasive approach.  #Chest pain #Elevated troponin - ASA 81 indefinitely - Heparin ACS nomogram - TTE - Start coreg 6.25 mg BID - Start losartan 25 mg daily - NPO for cath - High intensity statin - Monitor on telemetry  #HTN - Losartan, coreg as above  #Hx diabetes - A1C to confirm.  Favor SGLT2 but may be cost prohibitive.   Risk Assessment/Risk Scores:    TIMI Risk Score for Unstable Angina or Non-ST Elevation MI:   The patient's TIMI risk score is 5, which indicates a 26%  risk of all cause mortality, new or recurrent myocardial infarction or need for urgent revascularization in the next 14 days.       Severity of Illness: The appropriate patient status for this patient is INPATIENT. Inpatient status is judged to be reasonable and necessary in order to provide the required intensity of service to ensure the patient's safety. The patient's presenting symptoms, physical exam findings, and initial radiographic and laboratory data in the context of their chronic comorbidities is felt to place  them at high risk for further clinical deterioration. Furthermore, it is not anticipated that the patient will be medically stable for discharge from the hospital within 2 midnights of admission.   * I certify that at the point of admission it is my clinical judgment that the patient will require inpatient hospital care spanning beyond 2 midnights from the point of admission due to high intensity of service, high risk for further deterioration and high frequency of surveillance required.*   For questions or updates, please contact [place] Please consult [email or web address] for contact info under     Signed, Delight Hoh, MD  07/27/2021 3:06 AM

## 2021-07-28 ENCOUNTER — Other Ambulatory Visit (HOSPITAL_COMMUNITY): Payer: Self-pay

## 2021-07-28 ENCOUNTER — Encounter (HOSPITAL_COMMUNITY): Payer: Self-pay | Admitting: Interventional Cardiology

## 2021-07-28 DIAGNOSIS — E1159 Type 2 diabetes mellitus with other circulatory complications: Secondary | ICD-10-CM | POA: Diagnosis present

## 2021-07-28 DIAGNOSIS — E119 Type 2 diabetes mellitus without complications: Secondary | ICD-10-CM

## 2021-07-28 DIAGNOSIS — E1169 Type 2 diabetes mellitus with other specified complication: Secondary | ICD-10-CM | POA: Diagnosis not present

## 2021-07-28 DIAGNOSIS — Z720 Tobacco use: Secondary | ICD-10-CM

## 2021-07-28 DIAGNOSIS — I214 Non-ST elevation (NSTEMI) myocardial infarction: Secondary | ICD-10-CM | POA: Diagnosis not present

## 2021-07-28 DIAGNOSIS — I255 Ischemic cardiomyopathy: Secondary | ICD-10-CM

## 2021-07-28 DIAGNOSIS — I152 Hypertension secondary to endocrine disorders: Secondary | ICD-10-CM | POA: Diagnosis present

## 2021-07-28 DIAGNOSIS — I10 Essential (primary) hypertension: Secondary | ICD-10-CM

## 2021-07-28 DIAGNOSIS — E785 Hyperlipidemia, unspecified: Secondary | ICD-10-CM

## 2021-07-28 HISTORY — DX: Essential (primary) hypertension: I10

## 2021-07-28 HISTORY — DX: Ischemic cardiomyopathy: I25.5

## 2021-07-28 HISTORY — DX: Type 2 diabetes mellitus without complications: E11.9

## 2021-07-28 HISTORY — DX: Hyperlipidemia, unspecified: E11.69

## 2021-07-28 LAB — BASIC METABOLIC PANEL
Anion gap: 9 (ref 5–15)
BUN: 15 mg/dL (ref 8–23)
CO2: 22 mmol/L (ref 22–32)
Calcium: 9 mg/dL (ref 8.9–10.3)
Chloride: 104 mmol/L (ref 98–111)
Creatinine, Ser: 1.35 mg/dL — ABNORMAL HIGH (ref 0.61–1.24)
GFR, Estimated: 59 mL/min — ABNORMAL LOW (ref >60)
Glucose, Bld: 180 mg/dL — ABNORMAL HIGH (ref 70–99)
Potassium: 3.6 mmol/L (ref 3.5–5.1)
Sodium: 135 mmol/L (ref 135–145)

## 2021-07-28 LAB — CBC
HCT: 38.4 % — ABNORMAL LOW (ref 39.0–52.0)
Hemoglobin: 13.3 g/dL (ref 13.0–17.0)
MCH: 28.6 pg (ref 26.0–34.0)
MCHC: 34.6 g/dL (ref 30.0–36.0)
MCV: 82.6 fL (ref 80.0–100.0)
Platelets: 279 10*3/uL (ref 150–400)
RBC: 4.65 MIL/uL (ref 4.22–5.81)
RDW: 13 % (ref 11.5–15.5)
WBC: 9.6 10*3/uL (ref 4.0–10.5)
nRBC: 0 % (ref 0.0–0.2)

## 2021-07-28 LAB — HEPATIC FUNCTION PANEL
ALT: 41 U/L (ref 0–44)
AST: 181 U/L — ABNORMAL HIGH (ref 15–41)
Albumin: 2.9 g/dL — ABNORMAL LOW (ref 3.5–5.0)
Alkaline Phosphatase: 54 U/L (ref 38–126)
Bilirubin, Direct: <0.1 mg/dL (ref 0.0–0.2)
Total Bilirubin: 0.8 mg/dL (ref 0.3–1.2)
Total Protein: 6 g/dL — ABNORMAL LOW (ref 6.5–8.1)

## 2021-07-28 LAB — GLUCOSE, CAPILLARY
Glucose-Capillary: 202 mg/dL — ABNORMAL HIGH (ref 70–99)
Glucose-Capillary: 210 mg/dL — ABNORMAL HIGH (ref 70–99)

## 2021-07-28 MED ORDER — CARVEDILOL 6.25 MG PO TABS
6.2500 mg | ORAL_TABLET | Freq: Two times a day (BID) | ORAL | 6 refills | Status: DC
  Filled 2021-07-28: qty 60, 30d supply, fill #0

## 2021-07-28 MED ORDER — SACUBITRIL-VALSARTAN 24-26 MG PO TABS: 1.0000 | ORAL_TABLET | Freq: Two times a day (BID) | ORAL | Status: DC

## 2021-07-28 MED ORDER — ATORVASTATIN CALCIUM 80 MG PO TABS
80.0000 mg | ORAL_TABLET | Freq: Every day | ORAL | 11 refills | Status: DC
  Filled 2021-07-28: qty 30, 30d supply, fill #0

## 2021-07-28 MED ORDER — EMPAGLIFLOZIN 10 MG PO TABS
10.0000 mg | ORAL_TABLET | Freq: Every day | ORAL | Status: DC
  Administered 2021-07-28: 5 mg via ORAL
  Filled 2021-07-28: qty 1

## 2021-07-28 MED ORDER — EMPAGLIFLOZIN 10 MG PO TABS
10.0000 mg | ORAL_TABLET | Freq: Every day | ORAL | 3 refills | Status: DC
  Filled 2021-07-28: qty 14, 14d supply, fill #0

## 2021-07-28 MED ORDER — NITROGLYCERIN 0.4 MG SL SUBL
0.4000 mg | SUBLINGUAL_TABLET | SUBLINGUAL | 12 refills | Status: DC | PRN
  Filled 2021-07-28: qty 25, 7d supply, fill #0

## 2021-07-28 MED ORDER — TICAGRELOR 90 MG PO TABS
90.0000 mg | ORAL_TABLET | Freq: Two times a day (BID) | ORAL | 11 refills | Status: DC
  Filled 2021-07-28: qty 60, 30d supply, fill #0

## 2021-07-28 MED ORDER — SACUBITRIL-VALSARTAN 24-26 MG PO TABS
1.0000 | ORAL_TABLET | Freq: Two times a day (BID) | ORAL | 3 refills | Status: DC
  Filled 2021-07-28: qty 60, 30d supply, fill #0

## 2021-07-28 MED ORDER — ISOSORBIDE MONONITRATE ER 30 MG PO TB24
30.0000 mg | ORAL_TABLET | Freq: Every day | ORAL | 3 refills | Status: DC
  Filled 2021-07-28: qty 30, 30d supply, fill #0

## 2021-07-28 MED ORDER — ISOSORBIDE MONONITRATE ER 30 MG PO TB24
30.0000 mg | ORAL_TABLET | Freq: Every day | ORAL | Status: DC
  Administered 2021-07-28: 30 mg via ORAL
  Filled 2021-07-28: qty 1

## 2021-07-28 NOTE — Progress Notes (Signed)
Progress Note  Patient Name: Randy Sanchez Date of Encounter: 07/28/2021  [facility] Cardiologist: Skeet Latch, MD new  Subjective   No CP or SOB overnight. No probs w/ cath site. Still smokes a little  Inpatient Medications    Scheduled Meds:  amLODipine  2.5 mg Oral Daily   aspirin EC  81 mg Oral Daily   atorvastatin  80 mg Oral Daily   carvedilol  6.25 mg Oral BID WC   insulin aspart  0-15 Units Subcutaneous TID WC   sodium chloride flush  3 mL Intravenous Q12H   ticagrelor  90 mg Oral BID   Continuous Infusions:  sodium chloride     nitroGLYCERIN 10 mcg/min (07/27/21 1731)   PRN Meds: sodium chloride, acetaminophen, Heparin (Porcine) in NaCl, nitroGLYCERIN, ondansetron (ZOFRAN) IV, sodium chloride flush   Vital Signs    Vitals:   07/27/21 1815 07/27/21 1829 07/27/21 2137 07/27/21 2210  BP: 128/79 128/79 116/66 (!) 122/99  Pulse: 70 82 90 91  Resp:  15 18   Temp:  98.5 F (36.9 C) 99.3 F (37.4 C)   TempSrc:  Oral Oral   SpO2:  98% 95% 98%  Weight:      Height:        Intake/Output Summary (Last 24 hours) at 07/28/2021 0755 Last data filed at 07/27/2021 2200 Gross per 24 hour  Intake 186.1 ml  Output 675 ml  Net -488.9 ml   Last 3 Weights 07/26/2021  Weight (lbs) 180 lb  Weight (kg) 81.647 kg      Telemetry    SR, no sig ectopy- Personally Reviewed  ECG    SR, HR 74, evolving MI pattern w/ anterolateral T wave changes - Personally Reviewed  Physical Exam   General: Well developed, well nourished, male in no acute distress Head: Eyes PERRLA, Head normocephalic and atraumatic Lungs: clear bilaterally to auscultation. Heart: HRRR S1 S2, without rub or gallop. No murmur. 4/4 extremity pulses are 2+ & equal. No JVD. R radial cath site w/out ecchymosis or hematoma Abdomen: Bowel sounds are present, abdomen soft and non-tender without masses or  hernias noted. Msk: Normal strength and tone for age. Extremities: No clubbing, cyanosis  or edema.    Skin:  No rashes or lesions noted. Neuro: Alert and oriented X 3. Psych:  Good affect, responds appropriately  Labs    High Sensitivity Troponin:   Recent Labs  Lab 07/26/21 1850 07/26/21 2216 07/27/21 0646 07/27/21 1817  TROPONINIHS 866* 1,987* 6,936* >24,000*     Chemistry Recent Labs  Lab 07/26/21 1850 07/27/21 0646 07/28/21 0111  NA 136 134* 135  K 3.4* 3.6 3.6  CL 101 103 104  CO2 25 22 22   GLUCOSE 229* 210* 180*  BUN 7* 7* 15  CREATININE 1.02 1.01 1.35*  CALCIUM 9.9 9.4 9.0  GFRNONAA >60 >60 59*  ANIONGAP 10 9 9     Lipids  Recent Labs  Lab 07/27/21 0646  CHOL 245*  TRIG 91  HDL 47  LDLCALC 180*  CHOLHDL 5.2    Hematology Recent Labs  Lab 07/26/21 1850 07/27/21 0311 07/28/21 0111  WBC 7.5 8.1 9.6  RBC 5.78 5.44 4.65  HGB 15.8 15.4 13.3  HCT 48.7 44.6 38.4*  MCV 84.3 82.0 82.6  MCH 27.3 28.3 28.6  MCHC 32.4 34.5 34.6  RDW 12.8 12.9 13.0  PLT 351 317 279   Thyroid No results for input(s): TSH, FREET4 in the last 168 hours.  BNP Recent Labs  Lab  07/27/21 0646  BNP 791.2*    DDimer No results for input(s): DDIMER in the last 168 hours.  Lab Results  Component Value Date   HGBA1C 8.2 (H) 07/27/2021   No results found for: [facility]   Radiology    DG Chest 2 View  Result Date: 07/26/2021 CLINICAL DATA:  Chest pain EXAM: CHEST - 2 VIEW COMPARISON:  None. FINDINGS: Cardiac shadow is within normal limits. Mild aortic calcifications are seen. The lungs are well aerated bilaterally. No focal infiltrate or effusion is noted. Old healed rib fractures on the right are noted. IMPRESSION: No acute abnormality noted. Electronically Signed   By: Inez Catalina M.D.   On: 07/26/2021 19:31   CARDIAC CATHETERIZATION  Result Date: 07/27/2021   1st Mrg-1 lesion is 100% stenosed.  Releatively small vessel. Culprit for MI.  Balloon angioplasty was performed using a BALLN SAPPHIRE 2.0X15.   Post intervention, there is a 25% residual stenosis.   1st  Mrg-2 lesion is 75% stenosed.  Balloon angioplasty was performed using a BALLN SAPPHIRE 2.0X15.   Post intervention, there is a 25% residual stenosis.   Ost LAD to Prox LAD lesion is 25% stenosed.   Mid Cx lesion is 80% stenosed.   A drug-eluting stent was successfully placed using a STENT ONYX FRONTIER 3.0X15, postdilated to 3.25 mm.   Post intervention, there is a 0% residual stenosis.   2nd Diag lesion is 80% stenosed.   RPDA-2 lesion is 50% stenosed.   RPDA-1 lesion is 50% stenosed.   Prox RCA lesion is 25% stenosed.   There is mild to moderate left ventricular systolic dysfunction.   LV end diastolic pressure is low.   The left ventricular ejection fraction is 35-45% by visual estimate.   There is no aortic valve stenosis. I stressed the importance of dual antiplatelet therapy.  He will need aggressive secondary prevention including avoidance of any illicit substances, or tobacco.  He will need aggressive blood pressure control.  High-dose statin.   ECHOCARDIOGRAM COMPLETE  Result Date: 07/27/2021    ECHOCARDIOGRAM REPORT   Patient Name:   Randy Sanchez Date of Exam: 07/27/2021 Medical Rec #:  [id number]      Height:       67.0 in Accession #:    [id number]     Weight:       180.0 lb Date of Birth:  [birth year]       BSA:          1.934 m Patient Age:    65 years       BP:           124/75 mmHg Patient Gender: M              HR:           60 bpm. Exam Location:  Inpatient Procedure: 2D Echo, Cardiac Doppler and Color Doppler Indications:    Acute MI  History:        Patient has no prior history of Echocardiogram examinations.                 Acute MI.  Sonographer:    Glo Herring Referring Phys: [id number] [place]  1. Left ventricular ejection fraction, by estimation, is 40 to 45%. The left ventricle has mildly decreased function. The left ventricle demonstrates regional wall motion abnormalities with inferolateral and mid anterolateral hypokinesis. There is mild left ventricular  hypertrophy. Left ventricular diastolic parameters are consistent with Grade I diastolic dysfunction (  impaired relaxation).  2. Right ventricular systolic function is normal. The right ventricular size is normal. Tricuspid regurgitation signal is inadequate for assessing PA pressure.  3. The mitral valve is normal in structure. No evidence of mitral valve regurgitation. No evidence of mitral stenosis.  4. The aortic valve is tricuspid. Aortic valve regurgitation is not visualized. No aortic stenosis is present.  5. The inferior vena cava is normal in size with greater than 50% respiratory variability, suggesting right atrial pressure of 3 mmHg. FINDINGS  Left Ventricle: Left ventricular ejection fraction, by estimation, is 40 to 45%. The left ventricle has mildly decreased function. The left ventricle demonstrates regional wall motion abnormalities. The left ventricular internal cavity size was normal in size. There is mild left ventricular hypertrophy. Left ventricular diastolic parameters are consistent with Grade I diastolic dysfunction (impaired relaxation). Right Ventricle: The right ventricular size is normal. No increase in right ventricular wall thickness. Right ventricular systolic function is normal. Tricuspid regurgitation signal is inadequate for assessing PA pressure. Left Atrium: Left atrial size was normal in size. Right Atrium: Right atrial size was normal in size. Pericardium: There is no evidence of pericardial effusion. Mitral Valve: The mitral valve is normal in structure. No evidence of mitral valve regurgitation. No evidence of mitral valve stenosis. Tricuspid Valve: The tricuspid valve is normal in structure. Tricuspid valve regurgitation is not demonstrated. Aortic Valve: The aortic valve is tricuspid. Aortic valve regurgitation is not visualized. No aortic stenosis is present. Aortic valve mean gradient measures 2.0 mmHg. Aortic valve peak gradient measures 3.5 mmHg. Aortic valve area, by  VTI measures 2.35 cm. Pulmonic Valve: The pulmonic valve was normal in structure. Pulmonic valve regurgitation is not visualized. Aorta: The aortic root is normal in size and structure. Venous: The inferior vena cava is normal in size with greater than 50% respiratory variability, suggesting right atrial pressure of 3 mmHg. IAS/Shunts: No atrial level shunt detected by color flow Doppler.  LEFT VENTRICLE PLAX 2D LVIDd:         3.90 cm      Diastology LVIDs:         3.00 cm      LV e' medial:    5.22 cm/s LV PW:         1.30 cm      LV E/e' medial:  8.8 LV IVS:        1.30 cm      LV e' lateral:   4.24 cm/s LVOT diam:     2.00 cm      LV E/e' lateral: 10.8 LV SV:         41 LV SV Index:   21 LVOT Area:     3.14 cm  LV Volumes (MOD) LV vol d, MOD A2C: 102.0 ml LV vol d, MOD A4C: 110.0 ml LV vol s, MOD A2C: 59.5 ml LV vol s, MOD A4C: 69.5 ml LV SV MOD A2C:     42.5 ml LV SV MOD A4C:     110.0 ml LV SV MOD BP:      40.4 ml RIGHT VENTRICLE             IVC RV Basal diam:  3.00 cm     IVC diam: 1.60 cm RV S prime:     10.10 cm/s LEFT ATRIUM             Index        RIGHT ATRIUM  Index LA diam:        3.40 cm 1.76 cm/m   RA Area:     11.20 cm LA Vol (A2C):   43.7 ml 22.60 ml/m  RA Volume:   24.70 ml  12.77 ml/m LA Vol (A4C):   23.5 ml 12.15 ml/m LA Biplane Vol: 33.8 ml 17.48 ml/m  AORTIC VALVE                    PULMONIC VALVE AV Area (Vmax):    2.16 cm     PV Vmax:       0.88 m/s AV Area (Vmean):   2.08 cm     PV Peak grad:  3.1 mmHg AV Area (VTI):     2.35 cm AV Vmax:           93.30 cm/s AV Vmean:          68.800 cm/s AV VTI:            0.175 m AV Peak Grad:      3.5 mmHg AV Mean Grad:      2.0 mmHg LVOT Vmax:         64.00 cm/s LVOT Vmean:        45.600 cm/s LVOT VTI:          0.131 m LVOT/AV VTI ratio: 0.75  AORTA Ao Root diam: 2.90 cm Ao Asc diam:  2.90 cm MITRAL VALVE MV Area (PHT): 3.83 cm    SHUNTS MV Decel Time: 198 msec    Systemic VTI:  0.13 m MV E velocity: 45.80 cm/s  Systemic Diam: 2.00  cm MV A velocity: 56.60 cm/s MV E/A ratio:  0.81 Dalton McleanMD Electronically signed by Franki Monte Signature Date/Time: 07/27/2021/2:45:27 PM    Final     Cardiac Studies   ECHO: 07/27/2021  1. Left ventricular ejection fraction, by estimation, is 40 to 45%. The  left ventricle has mildly decreased function. The left ventricle  demonstrates regional wall motion abnormalities with inferolateral and mid anterolateral hypokinesis. There is mild left ventricular hypertrophy. Left ventricular diastolic parameters are consistent with Grade I diastolic dysfunction (impaired relaxation).   2. Right ventricular systolic function is normal. The right ventricular  size is normal. Tricuspid regurgitation signal is inadequate for assessing PA pressure.   3. The mitral valve is normal in structure. No evidence of mitral valve regurgitation. No evidence of mitral stenosis.   4. The aortic valve is tricuspid. Aortic valve regurgitation is not  visualized. No aortic stenosis is present.   5. The inferior vena cava is normal in size with greater than 50%  respiratory variability, suggesting right atrial pressure of 3 mmHg.   CATH: 07/27/2021   1st Mrg-1 lesion is 100% stenosed.  Releatively small vessel. Culprit for MI.  Balloon angioplasty was performed using a BALLN SAPPHIRE 2.0X15.   Post intervention, there is a 25% residual stenosis.   1st Mrg-2 lesion is 75% stenosed.  Balloon angioplasty was performed using a BALLN SAPPHIRE 2.0X15.   Post intervention, there is a 25% residual stenosis.   Ost LAD to Prox LAD lesion is 25% stenosed.   Mid Cx lesion is 80% stenosed.   A drug-eluting stent was successfully placed using a STENT ONYX FRONTIER 3.0X15, postdilated to 3.25 mm.   Post intervention, there is a 0% residual stenosis.   2nd Diag lesion is 80% stenosed.   RPDA-2 lesion is 50% stenosed.   RPDA-1 lesion is 50% stenosed.   Prox  RCA lesion is 25% stenosed.   There is mild to moderate left  ventricular systolic dysfunction.   LV end diastolic pressure is low.   The left ventricular ejection fraction is 35-45% by visual estimate.   There is no aortic valve stenosis.   I stressed the importance of dual antiplatelet therapy.  He will need aggressive secondary prevention including avoidance of any illicit substances, or tobacco.  He will need aggressive blood pressure control.  High-dose statin.  Diagnostic Dominance: Right Intervention     Patient Profile     65 y.o. male w/ no recent medical care and hx HTN, DM was admitted 11/22 w/ NSTEMI  Assessment & Plan    NSTEMI - Peak trop >24,000 - he had PTCA OM (culprit vessel) and DES CFX - residual dz in other vessels up to 80% >> rx medically - on high-dose statin and BB - will add Imdur 30 mg qd - cont DAPT  2. DM - hx this, but not on meds pta - A1c 8.2 - needs rx, discuss w/ MD - Could add Farxiga  3. HLD - cont Lipitor, recheck as outpt  4. HTN - not new dx, but not treated pta - on Coreg 6.25 mg bid, amlodipine 2.5 mg qd - BP has improved - discuss options w/ MD  Cardiac rehab has seen, pt did well D/c when stable.   For questions or updates, please contact [place] Please consult [email or web address] for contact info under        Signed, Rosaria Ferries, PA-C  07/28/2021, 7:55 AM

## 2021-07-28 NOTE — Progress Notes (Signed)
Discharge instructions (including medications) discussed with and copy provided to patient/caregiver 

## 2021-07-28 NOTE — Discharge Instructions (Signed)

## 2021-07-28 NOTE — Progress Notes (Signed)
Inpatient Diabetes Program Recommendations  AACE/ADA: New Consensus Statement on Inpatient Glycemic Control (2015)  Target Ranges:  Prepandial:   less than 140 mg/dL      Peak postprandial:   less than 180 mg/dL (1-2 hours)      Critically ill patients:  140 - 180 mg/dL   Lab Results  Component Value Date   GLUCAP 210 (H) 07/28/2021   HGBA1C 8.2 (H) 07/27/2021   Spoke with pt at bedside. Reviewed lifestyle modifications. Pt reports taking metformin 1000 gm Daily up until 1 week ago. Discussed pt potentially being on SGLT-2 medication, described how it works and benefits of medication. Told pt if medication costs is too much to be placed back on metformin. Pt reports drinking a lot of koolaid.  Thanks,  Tama Headings RN, MSN, BC-ADM Inpatient Diabetes Coordinator Team Pager [phone] (8a-5p)

## 2021-07-28 NOTE — Progress Notes (Signed)
TR BAND REMOVAL  LOCATION:    right radial  DEFLATED PER PROTOCOL:    Yes.    TIME BAND OFF / DRESSING APPLIED:      2210 SITE UPON ARRIVAL:    Level 0  SITE AFTER BAND REMOVAL:    Level 0  CIRCULATION SENSATION AND MOVEMENT:    Within Normal Limits   Yes.    COMMENTS:   Pt.tolerated procedure well

## 2021-07-28 NOTE — TOC Benefit Eligibility Note (Signed)
Patient Teacher, English as a foreign language completed.    The patient is currently admitted and upon discharge could be taking Brilinta 90mg  tabs.  The current 30 day co-pay is $0.   The patient is insured through [employer].    Sharlette Dense, [place] Patient Advocate Specialist [place] Patient Advocate Team Direct Number: [phone]  Fax: [phone]

## 2021-07-28 NOTE — Discharge Summary (Signed)
Discharge Summary    Patient ID: Randy Sanchez MRN: [id number]; DOB: 06-01-1956  Admit date: 07/26/2021 Discharge date: 07/28/2021  PCP:  Pcp, No   [facility] Providers Cardiologist:  Skeet Latch, MD        Discharge Diagnoses    Principal Problem:   NSTEMI (non-ST elevated myocardial infarction) [facility]) Active Problems:   Diabetes mellitus type II, non insulin dependent ([place])   Hyperlipidemia associated with type 2 diabetes mellitus ([place]), goal LDL < 70   HTN, goal below 130/80   Ischemic cardiomyopathy    Diagnostic Studies/Procedures    ECHO: 07/27/2021  1. Left ventricular ejection fraction, by estimation, is 40 to 45%. The  left ventricle has mildly decreased function. The left ventricle  demonstrates regional wall motion abnormalities with inferolateral and mid anterolateral hypokinesis. There is mild left ventricular hypertrophy. Left ventricular diastolic parameters are consistent with Grade I diastolic dysfunction (impaired relaxation).   2. Right ventricular systolic function is normal. The right ventricular  size is normal. Tricuspid regurgitation signal is inadequate for assessing PA pressure.   3. The mitral valve is normal in structure. No evidence of mitral valve regurgitation. No evidence of mitral stenosis.   4. The aortic valve is tricuspid. Aortic valve regurgitation is not  visualized. No aortic stenosis is present.   5. The inferior vena cava is normal in size with greater than 50%  respiratory variability, suggesting right atrial pressure of 3 mmHg.    CATH: 07/27/2021   1st Mrg-1 lesion is 100% stenosed.  Releatively small vessel. Culprit for MI.  Balloon angioplasty was performed using a BALLN SAPPHIRE 2.0X15.   Post intervention, there is a 25% residual stenosis.   1st Mrg-2 lesion is 75% stenosed.  Balloon angioplasty was performed using a BALLN SAPPHIRE 2.0X15.   Post intervention, there is a 25% residual stenosis.   Ost LAD to Prox LAD  lesion is 25% stenosed.   Mid Cx lesion is 80% stenosed.   A drug-eluting stent was successfully placed using a STENT ONYX FRONTIER 3.0X15, postdilated to 3.25 mm.   Post intervention, there is a 0% residual stenosis.   2nd Diag lesion is 80% stenosed.   RPDA-2 lesion is 50% stenosed.   RPDA-1 lesion is 50% stenosed.   Prox RCA lesion is 25% stenosed.   There is mild to moderate left ventricular systolic dysfunction.   LV end diastolic pressure is low.   The left ventricular ejection fraction is 35-45% by visual estimate.   There is no aortic valve stenosis.   I stressed the importance of dual antiplatelet therapy.  He will need aggressive secondary prevention including avoidance of any illicit substances, or tobacco.  He will need aggressive blood pressure control.  High-dose statin.  Diagnostic Dominance: Right Intervention    _____________   History of Present Illness     Randy Sanchez is a 65 y.o. male with  no recent medical care and hx HTN, DM was admitted 11/22 w/ NSTEMI.  Hospital Course     Consultants: None   Cardiac enzyme peak was greater than 24,000.  He was taken to the Cath Lab on 11/22.  Results are above, he had multivessel disease, with the culprit lesion being OM1.  This was treated with PTCA.  He also had an 80% circumflex, treated with DES.  He tolerated the procedure well.  Medical therapy is recommended for other disease between 25 and 80%.  He was started on Imdur.  For his MI, he was on aspirin,  high-dose statin, Imdur, and a beta-blocker.  He was seen by cardiac rehab and ambulated without chest pain or shortness of breath.  He had a previous diagnosis of diabetes, but was on no medications.  His hemoglobin A1c was 8.2.  He was given information on a diabetic diet and will follow-up as an outpatient.  He was started on Jardiance.  He had a history of hypertension, but was not taking any medications prior to admission.  He was started on carvedilol, and  Entresto.  An echo showed his EF at 40-45% with WMA and  grade 1 DD.  He is not currently on a diuretic and is not volume overloaded on exam.  He was started on beta-blocker, Imdur, and Entresto.   His creatinine bumped a little bit after cath, this we will watch closely as an outpatient. His LFTs showed and elevated AST, but ALT and Alk Phos were normal. This is felt related to his MI. His total protein and albumin were a little low, increased protein intake is encouraged.   On 11/23, he was seen by Dr. Oval Linsey and all data were reviewed.  He had ambulated well with cardiac rehab and had been educated by them as well.  No further inpatient work-up is indicated and he is considered stable for discharge, to follow-up as an outpatient.  Did the patient have an acute coronary syndrome (MI, NSTEMI, STEMI, etc) this admission?:  Yes                               AHA/ACC Clinical Performance & Quality Measures: Aspirin prescribed? - Yes ADP Receptor Inhibitor (Plavix/Clopidogrel, Brilinta/Ticagrelor or Effient/Prasugrel) prescribed (includes medically managed patients)? - Yes Beta Blocker prescribed? - Yes High Intensity Statin (Lipitor 40-80mg  or Crestor 20-40mg ) prescribed? - Yes EF assessed during THIS hospitalization? - Yes For EF <40%, was ACEI/ARB prescribed? - Not Applicable (EF >/= 29%) For EF <40%, Aldosterone Antagonist (Spironolactone or Eplerenone) prescribed? - Not Applicable (EF >/= 51%) Cardiac Rehab Phase II ordered (including medically managed patients)? - Yes     _____________  Discharge Vitals Blood pressure 120/72, pulse 85, temperature (!) 97.4 F (36.3 C), temperature source Oral, resp. rate 17, height 5\' 7"  (1.702 m), weight 81.6 kg, SpO2 98 %.  Filed Weights   07/26/21 2100  Weight: 81.6 kg    Labs & Radiologic Studies    CBC Recent Labs    07/27/21 0311 07/28/21 0111  WBC 8.1 9.6  HGB 15.4 13.3  HCT 44.6 38.4*  MCV 82.0 82.6  PLT 317 884   Basic  Metabolic Panel Recent Labs    07/27/21 0646 07/28/21 0111  NA 134* 135  K 3.6 3.6  CL 103 104  CO2 22 22  GLUCOSE 210* 180*  BUN 7* 15  CREATININE 1.01 1.35*  CALCIUM 9.4 9.0    High Sensitivity Troponin:   Recent Labs  Lab 07/26/21 1850 07/26/21 2216 07/27/21 0646 07/27/21 1817  TROPONINIHS 866* 1,987* 6,936* >24,000*    Hemoglobin A1C Recent Labs    07/27/21 0646  HGBA1C 8.2*   Fasting Lipid Panel Recent Labs    07/27/21 0646  CHOL 245*  HDL 47  LDLCALC 180*  TRIG 91  CHOLHDL 5.2   Thyroid Function Tests No results for input(s): TSH, T4TOTAL, T3FREE, THYROIDAB in the last 72 hours.  Invalid input(s): FREET3 _____________  DG Chest 2 View  Result Date: 07/26/2021 CLINICAL DATA:  Chest pain EXAM:  CHEST - 2 VIEW COMPARISON:  None. FINDINGS: Cardiac shadow is within normal limits. Mild aortic calcifications are seen. The lungs are well aerated bilaterally. No focal infiltrate or effusion is noted. Old healed rib fractures on the right are noted. IMPRESSION: No acute abnormality noted. Electronically Signed   By: Inez Catalina M.D.   On: 07/26/2021 19:31   CARDIAC CATHETERIZATION  Result Date: 07/27/2021   1st Mrg-1 lesion is 100% stenosed.  Releatively small vessel. Culprit for MI.  Balloon angioplasty was performed using a BALLN SAPPHIRE 2.0X15.   Post intervention, there is a 25% residual stenosis.   1st Mrg-2 lesion is 75% stenosed.  Balloon angioplasty was performed using a BALLN SAPPHIRE 2.0X15.   Post intervention, there is a 25% residual stenosis.   Ost LAD to Prox LAD lesion is 25% stenosed.   Mid Cx lesion is 80% stenosed.   A drug-eluting stent was successfully placed using a STENT ONYX FRONTIER 3.0X15, postdilated to 3.25 mm.   Post intervention, there is a 0% residual stenosis.   2nd Diag lesion is 80% stenosed.   RPDA-2 lesion is 50% stenosed.   RPDA-1 lesion is 50% stenosed.   Prox RCA lesion is 25% stenosed.   There is mild to moderate left  ventricular systolic dysfunction.   LV end diastolic pressure is low.   The left ventricular ejection fraction is 35-45% by visual estimate.   There is no aortic valve stenosis. I stressed the importance of dual antiplatelet therapy.  He will need aggressive secondary prevention including avoidance of any illicit substances, or tobacco.  He will need aggressive blood pressure control.  High-dose statin.   ECHOCARDIOGRAM COMPLETE  Result Date: 07/27/2021    ECHOCARDIOGRAM REPORT   Patient Name:   Randy Sanchez Date of Exam: 07/27/2021 Medical Rec #:  [id number]      Height:       67.0 in Accession #:    [id number]     Weight:       180.0 lb Date of Birth:  1956/08/19       BSA:          1.934 m Patient Age:    35 years       BP:           124/75 mmHg Patient Gender: M              HR:           60 bpm. Exam Location:  Inpatient Procedure: 2D Echo, Cardiac Doppler and Color Doppler Indications:    Acute MI  History:        Patient has no prior history of Echocardiogram examinations.                 Acute MI.  Sonographer:    Glo Herring Referring Phys: [id number] [place]  1. Left ventricular ejection fraction, by estimation, is 40 to 45%. The left ventricle has mildly decreased function. The left ventricle demonstrates regional wall motion abnormalities with inferolateral and mid anterolateral hypokinesis. There is mild left ventricular hypertrophy. Left ventricular diastolic parameters are consistent with Grade I diastolic dysfunction (impaired relaxation).  2. Right ventricular systolic function is normal. The right ventricular size is normal. Tricuspid regurgitation signal is inadequate for assessing PA pressure.  3. The mitral valve is normal in structure. No evidence of mitral valve regurgitation. No evidence of mitral stenosis.  4. The aortic valve is tricuspid. Aortic valve regurgitation is not visualized. No aortic  stenosis is present.  5. The inferior vena cava is normal in size with  greater than 50% respiratory variability, suggesting right atrial pressure of 3 mmHg. FINDINGS  Left Ventricle: Left ventricular ejection fraction, by estimation, is 40 to 45%. The left ventricle has mildly decreased function. The left ventricle demonstrates regional wall motion abnormalities. The left ventricular internal cavity size was normal in size. There is mild left ventricular hypertrophy. Left ventricular diastolic parameters are consistent with Grade I diastolic dysfunction (impaired relaxation). Right Ventricle: The right ventricular size is normal. No increase in right ventricular wall thickness. Right ventricular systolic function is normal. Tricuspid regurgitation signal is inadequate for assessing PA pressure. Left Atrium: Left atrial size was normal in size. Right Atrium: Right atrial size was normal in size. Pericardium: There is no evidence of pericardial effusion. Mitral Valve: The mitral valve is normal in structure. No evidence of mitral valve regurgitation. No evidence of mitral valve stenosis. Tricuspid Valve: The tricuspid valve is normal in structure. Tricuspid valve regurgitation is not demonstrated. Aortic Valve: The aortic valve is tricuspid. Aortic valve regurgitation is not visualized. No aortic stenosis is present. Aortic valve mean gradient measures 2.0 mmHg. Aortic valve peak gradient measures 3.5 mmHg. Aortic valve area, by VTI measures 2.35 cm. Pulmonic Valve: The pulmonic valve was normal in structure. Pulmonic valve regurgitation is not visualized. Aorta: The aortic root is normal in size and structure. Venous: The inferior vena cava is normal in size with greater than 50% respiratory variability, suggesting right atrial pressure of 3 mmHg. IAS/Shunts: No atrial level shunt detected by color flow Doppler.  LEFT VENTRICLE PLAX 2D LVIDd:         3.90 cm      Diastology LVIDs:         3.00 cm      LV e' medial:    5.22 cm/s LV PW:         1.30 cm      LV E/e' medial:  8.8 LV IVS:         1.30 cm      LV e' lateral:   4.24 cm/s LVOT diam:     2.00 cm      LV E/e' lateral: 10.8 LV SV:         41 LV SV Index:   21 LVOT Area:     3.14 cm  LV Volumes (MOD) LV vol d, MOD A2C: 102.0 ml LV vol d, MOD A4C: 110.0 ml LV vol s, MOD A2C: 59.5 ml LV vol s, MOD A4C: 69.5 ml LV SV MOD A2C:     42.5 ml LV SV MOD A4C:     110.0 ml LV SV MOD BP:      40.4 ml RIGHT VENTRICLE             IVC RV Basal diam:  3.00 cm     IVC diam: 1.60 cm RV S prime:     10.10 cm/s LEFT ATRIUM             Index        RIGHT ATRIUM           Index LA diam:        3.40 cm 1.76 cm/m   RA Area:     11.20 cm LA Vol (A2C):   43.7 ml 22.60 ml/m  RA Volume:   24.70 ml  12.77 ml/m LA Vol (A4C):   23.5 ml 12.15 ml/m LA Biplane Vol: 33.8 ml  17.48 ml/m  AORTIC VALVE                    PULMONIC VALVE AV Area (Vmax):    2.16 cm     PV Vmax:       0.88 m/s AV Area (Vmean):   2.08 cm     PV Peak grad:  3.1 mmHg AV Area (VTI):     2.35 cm AV Vmax:           93.30 cm/s AV Vmean:          68.800 cm/s AV VTI:            0.175 m AV Peak Grad:      3.5 mmHg AV Mean Grad:      2.0 mmHg LVOT Vmax:         64.00 cm/s LVOT Vmean:        45.600 cm/s LVOT VTI:          0.131 m LVOT/AV VTI ratio: 0.75  AORTA Ao Root diam: 2.90 cm Ao Asc diam:  2.90 cm MITRAL VALVE MV Area (PHT): 3.83 cm    SHUNTS MV Decel Time: 198 msec    Systemic VTI:  0.13 m MV E velocity: 45.80 cm/s  Systemic Diam: 2.00 cm MV A velocity: 56.60 cm/s MV E/A ratio:  0.81 Dalton McleanMD Electronically signed by Franki Monte Signature Date/Time: 07/27/2021/2:45:27 PM    Final    Disposition   Pt is being discharged home today in good condition.  Follow-up Plans & Appointments     Discharge Instructions     Amb Referral to Cardiac Rehabilitation   Complete by: As directed    [place]   Diagnosis:  Coronary Stents PTCA NSTEMI     After initial evaluation and assessments completed: Virtual Based Care may be provided alone or in conjunction with Phase 2 Cardiac  Rehab based on patient barriers.: Yes   Call MD for:  redness, tenderness, or signs of infection (pain, swelling, redness, odor or green/yellow discharge around incision site)   Complete by: As directed    Diet - low sodium heart healthy   Complete by: As directed    Diet Carb Modified   Complete by: As directed    Increase activity slowly   Complete by: As directed        Discharge Medications   Allergies as of 07/28/2021   No Known Allergies      Medication List     TAKE these medications    aspirin EC 81 MG tablet Take 81 mg by mouth daily. Swallow whole.   atorvastatin 80 MG tablet Commonly known as: LIPITOR Take 1 tablet (80 mg total) by mouth daily. Start taking on: July 29, 2021   Brilinta 90 MG Tabs tablet Generic drug: ticagrelor Take 1 tablet (90 mg total) by mouth 2 (two) times daily.   carvedilol 6.25 MG tablet Commonly known as: COREG Take 1 tablet (6.25 mg total) by mouth 2 (two) times daily with a meal.   empagliflozin 10 MG Tabs tablet Commonly known as: JARDIANCE Take 1 tablet (10 mg total) by mouth daily.   isosorbide mononitrate 30 MG 24 hr tablet Commonly known as: IMDUR Take 1 tablet (30 mg total) by mouth daily. Start taking on: July 29, 2021   nitroGLYCERIN 0.4 MG SL tablet Commonly known as: NITROSTAT Place 1 tablet (0.4 mg total) under the tongue every 5 (five) minutes x 3 doses as needed for chest  pain.   Prostate Caps Take 1 capsule by mouth daily.   sacubitril-valsartan 24-26 MG Commonly known as: ENTRESTO Take 1 tablet by mouth 2 (two) times daily.           Outstanding Labs/Studies   None  Duration of Discharge Encounter   Greater than 30 minutes including physician time.  Signed, Rosaria Ferries, PA-C 07/28/2021, 12:20 PM

## 2021-07-28 NOTE — Progress Notes (Signed)
Inpatient Diabetes Program Recommendations  AACE/ADA: New Consensus Statement on Inpatient Glycemic Control (2015)  Target Ranges:  Prepandial:   less than 140 mg/dL      Peak postprandial:   less than 180 mg/dL (1-2 hours)      Critically ill patients:  140 - 180 mg/dL   Lab Results  Component Value Date   GLUCAP 202 (H) 07/28/2021   HGBA1C 8.2 (H) 07/27/2021    Review of Glycemic Control  Diabetes history: DM 2 Outpatient Diabetes medications: None Current orders for Inpatient glycemic control:  Novolog 0-15 units tid  A1c 8.2% on 11/22 No PCP listed  Inpatient Diabetes Program Recommendations:    - Jardiance maybe more preferred than Farxiga under plan  Will see pt this afternoon.   Thanks,  Tama Headings RN, MSN, BC-ADM Inpatient Diabetes Coordinator Team Pager [phone] (8a-5p)

## 2021-07-28 NOTE — Plan of Care (Signed)
  Problem: Education: Goal: Knowledge of General Education information will improve Description: Including pain rating scale, medication(s)/side effects and non-pharmacologic comfort measures Outcome: Adequate for Discharge   

## 2021-07-28 NOTE — Progress Notes (Addendum)
CARDIAC REHAB PHASE I   PRE:  Rate/Rhythm: 78 SR  BP:  Sitting: 118/83      SaO2: 99 RA  MODE:  Ambulation: 470 ft   POST:  Rate/Rhythm: 90 SR  BP:  Sitting: 125/73      SaO2: 100 RA  Pt tolerated exercise well and amb 470 ft independently. Pt denies CP, SOB, or dizziness throughout walk and did not take rest breaks. Ed given to pt. Discussed restrictions, MI booklet, heart healthy diet, diabetic diet, smoking cessation, and exercise guidelines. Stressed importance of ASA and Brilinta, NTG use, and stent card. Pt showed verbal understanding of ed. Will refer to [facility].   Will continue to follow.  [place], MS, ACSM-CEP 07/28/2021 8:31 AM

## 2021-08-04 ENCOUNTER — Telehealth: Payer: Self-pay | Admitting: *Deleted

## 2021-08-04 DIAGNOSIS — Z006 Encounter for examination for normal comparison and control in clinical research program: Secondary | ICD-10-CM

## 2021-08-04 NOTE — Telephone Encounter (Signed)
Spoke with patient about participating in [place] V-Inception.  Patient was mailed a copy of consent.  Will Follow-up with the patient ion 12/5.      Jasmine Pang, RN BSN [facility] Cardiovascular Research & Education Direct Line: [phone]

## 2021-08-05 DIAGNOSIS — Z419 Encounter for procedure for purposes other than remedying health state, unspecified: Secondary | ICD-10-CM | POA: Diagnosis not present

## 2021-08-09 ENCOUNTER — Telehealth (HOSPITAL_COMMUNITY): Payer: Self-pay

## 2021-08-09 ENCOUNTER — Other Ambulatory Visit (HOSPITAL_COMMUNITY): Payer: Self-pay

## 2021-08-09 ENCOUNTER — Telehealth: Payer: Self-pay | Admitting: *Deleted

## 2021-08-09 DIAGNOSIS — Z006 Encounter for examination for normal comparison and control in clinical research program: Secondary | ICD-10-CM

## 2021-08-09 NOTE — Telephone Encounter (Signed)
Transitions of Care Pharmacy   Call attempted for a pharmacy transitions of care follow-up. Unable to leave voicemail.  Call attempt #1. Will follow-up in 2-3 days.    

## 2021-08-09 NOTE — Telephone Encounter (Signed)
Patient declined interest in participating in V-inception research study

## 2021-08-16 ENCOUNTER — Encounter (HOSPITAL_BASED_OUTPATIENT_CLINIC_OR_DEPARTMENT_OTHER): Payer: Self-pay | Admitting: Family

## 2021-08-16 ENCOUNTER — Ambulatory Visit (INDEPENDENT_AMBULATORY_CARE_PROVIDER_SITE_OTHER): Payer: Medicaid Other | Admitting: Family

## 2021-08-16 ENCOUNTER — Telehealth (HOSPITAL_BASED_OUTPATIENT_CLINIC_OR_DEPARTMENT_OTHER): Payer: Self-pay | Admitting: Family

## 2021-08-16 ENCOUNTER — Other Ambulatory Visit: Payer: Self-pay

## 2021-08-16 VITALS — BP 124/78 | HR 85 | Ht 67.0 in | Wt 155.0 lb

## 2021-08-16 DIAGNOSIS — I5022 Chronic systolic (congestive) heart failure: Secondary | ICD-10-CM | POA: Diagnosis not present

## 2021-08-16 DIAGNOSIS — I10 Essential (primary) hypertension: Secondary | ICD-10-CM

## 2021-08-16 DIAGNOSIS — E1165 Type 2 diabetes mellitus with hyperglycemia: Secondary | ICD-10-CM

## 2021-08-16 DIAGNOSIS — I25118 Atherosclerotic heart disease of native coronary artery with other forms of angina pectoris: Secondary | ICD-10-CM

## 2021-08-16 DIAGNOSIS — I255 Ischemic cardiomyopathy: Secondary | ICD-10-CM | POA: Diagnosis not present

## 2021-08-16 DIAGNOSIS — Z720 Tobacco use: Secondary | ICD-10-CM

## 2021-08-16 DIAGNOSIS — E785 Hyperlipidemia, unspecified: Secondary | ICD-10-CM

## 2021-08-16 MED ORDER — EMPAGLIFLOZIN 10 MG PO TABS
10.0000 mg | ORAL_TABLET | Freq: Every day | ORAL | 3 refills | Status: DC
Start: 2021-08-16 — End: 2021-11-16

## 2021-08-16 NOTE — Telephone Encounter (Signed)
Per covermymeds: Jardiance has been approved.  Approved quantity: 30 units per 30 day(s). The drug has been approved from 08/02/2021 to 08/16/2022. Please call the pharmacy to process your prescription claim.

## 2021-08-16 NOTE — Patient Instructions (Signed)
Medication Instructions:  Your Physician recommend you continue on your current medication as directed.    *If you need a refill on your cardiac medications before your next appointment, please call your pharmacy*   Lab Work: Your physician recommends that you return for lab work in:   Today- BMET  1 month- CMET, Liver Panel- you may return to [place] (3rd floor), the [place] office, or any [facility] office- see attached location sheet   If you have labs (blood work) drawn today and your tests are completely normal, you will receive your results only by: [place] (if you have MyChart) OR A paper copy in the mail If you have any lab test that is abnormal or we need to change your treatment, we will call you to review the results.   Testing/Procedures: Your physician has requested that you have an echocardiogram In 3 months Echocardiography is a painless test that uses sound waves to create images of your heart. It provides your doctor with information about the size and shape of your heart and how well your heart's chambers and valves are working. This procedure takes approximately one hour. There are no restrictions for this procedure. [place], you and your health needs are our priority.  As part of our continuing mission to provide you with exceptional heart care, we have created designated Provider Care Teams.  These Care Teams include your primary Cardiologist (physician) and Advanced Practice Providers (APPs -  Physician Assistants and Nurse Practitioners) who all work together to provide you with the care you need, when you need it.  We recommend signing up for the patient portal called "MyChart".  Sign up information is provided on this After Visit Summary.  MyChart is used to connect with patients for Virtual Visits (Telemedicine).  Patients are able to view lab/test results, encounter notes, upcoming appointments,  etc.  Non-urgent messages can be sent to your provider as well.   To learn more about what you can do with MyChart, go to [email or web address].    Your next appointment:   4 month(s)  The format for your next appointment:   In Person  Provider:   Skeet Latch, MD, Laurann Montana, NP, or Coletta Memos, NP    Other Instructions Recommend establishing with a primary care provider.  You may call [facility] @ [phone] for a list of primary care providers in your area or visit their website [email or web address] Please have any insurance card available before calling or going online.   For coronary artery disease often called "heart disease" we aim for optimal guideline directed medical therapy. We use the "A, B, C"s to help keep [place] on track!  A = Aspirin 81mg  daily B = Beta blocker which helps to relax the heart. This is your Carvedilol. C = Cholesterol control. You take Atorvastatin to help control your cholesterol.  D = Don't forget nitroglycerin! This is an emergency tablet to be used if you have chest pain. E = Extras. In your case, this is Brilinta for 1 year to protect your stent.   To help strengthen your heart you are taking: Entresto, Carvedilol (Coreg), Isosorbide Mononitrate (Imdur), Empagliflozin (Jardiance)

## 2021-08-16 NOTE — Progress Notes (Signed)
Office Visit    Patient Name: Randy Sanchez Date of Encounter: 08/16/2021  PCP:  Kathyrn Lass   [place]  Cardiologist:  Skeet Latch, MD  Advanced Practice Provider:  No care team member to display Electrophysiologist:  None   Chief Complaint    Randy Sanchez is a 65 y.o. male with a hx of coronary disease s/p DES to Cx 07/2021, hypertension, hyperlipidemia, tobacco use, diabetes presents today for follow-up after coronary stenting  Past Medical History    Past Medical History:  Diagnosis Date   Diabetes mellitus type II, non insulin dependent ([place]) 07/28/2021   HTN, goal below 130/80 07/28/2021   Hyperlipidemia associated with type 2 diabetes mellitus ([place]), goal LDL < 70 07/28/2021   Ischemic cardiomyopathy 07/28/2021   NSTEMI (non-ST elevated myocardial infarction) ([place]) 07/27/2021   Past Surgical History:  Procedure Laterality Date   CORONARY STENT INTERVENTION N/A 07/27/2021   Procedure: CORONARY STENT INTERVENTION;  Surgeon: Jettie Booze, MD;  Location: [facility];  Service: Cardiovascular;  Laterality: N/A;   LEFT HEART CATH AND CORONARY ANGIOGRAPHY N/A 07/27/2021   Procedure: LEFT HEART CATH AND CORONARY ANGIOGRAPHY;  Surgeon: Jettie Booze, MD;  Location: [facility];  Service: Cardiovascular;  Laterality: N/A;    Allergies  No Known Allergies  History of Present Illness    Randy Sanchez is a 65 y.o. male with a hx of coronary disease s/p DES to Cx 07/2021, hypertension, hyperlipidemia, tobacco use, diabetes last seen while admitted.  He presented to the ED 07/25/2021 with chest pain with troponin peak greater than 24,000.  Cardiac catheterization showed multivessel disease with culprit lesion being OM1 treated with PTCA.  Also 80% circumflex treated with DES.  Echo during admission showed LVEF 40-45%.  He had not been on any medications since 2018.  Jardiance was started for diabetes.  Entresto,  carvedilol started for HFrEF and HTN.  Aspirin, statin, Imdur, beta-blocker started for coronary disease.   Presents today for follow-up with his nephew. Reports no shortness of breath and only mild dyspnea on exertion when he is returning from his 5 mile walk. Reports no chest pain, pressure, or tightness. No edema, orthopnea, PND. Reports no palpitations.  Reviewed hospital records in detail. He is compliant with medications and they are affordable with Medicaid.   EKGs/Labs/Other Studies Reviewed:   The following studies were reviewed today:  ECHO: 07/27/2021  1. Left ventricular ejection fraction, by estimation, is 40 to 45%. The  left ventricle has mildly decreased function. The left ventricle  demonstrates regional wall motion abnormalities with inferolateral and mid anterolateral hypokinesis. There is mild left ventricular hypertrophy. Left ventricular diastolic parameters are consistent with Grade I diastolic dysfunction (impaired relaxation).   2. Right ventricular systolic function is normal. The right ventricular  size is normal. Tricuspid regurgitation signal is inadequate for assessing PA pressure.   3. The mitral valve is normal in structure. No evidence of mitral valve regurgitation. No evidence of mitral stenosis.   4. The aortic valve is tricuspid. Aortic valve regurgitation is not  visualized. No aortic stenosis is present.   5. The inferior vena cava is normal in size with greater than 50%  respiratory variability, suggesting right atrial pressure of 3 mmHg.    CATH: 07/27/2021   1st Mrg-1 lesion is 100% stenosed.  Releatively small vessel. Culprit for MI.  Balloon angioplasty was performed using a BALLN SAPPHIRE 2.0X15.   Post intervention, there is a 25%  residual stenosis.   1st Mrg-2 lesion is 75% stenosed.  Balloon angioplasty was performed using a BALLN SAPPHIRE 2.0X15.   Post intervention, there is a 25% residual stenosis.   Ost LAD to Prox LAD lesion is 25%  stenosed.   Mid Cx lesion is 80% stenosed.   A drug-eluting stent was successfully placed using a STENT ONYX FRONTIER 3.0X15, postdilated to 3.25 mm.   Post intervention, there is a 0% residual stenosis.   2nd Diag lesion is 80% stenosed.   RPDA-2 lesion is 50% stenosed.   RPDA-1 lesion is 50% stenosed.   Prox RCA lesion is 25% stenosed.   There is mild to moderate left ventricular systolic dysfunction.   LV end diastolic pressure is low.   The left ventricular ejection fraction is 35-45% by visual estimate.   There is no aortic valve stenosis.   I stressed the importance of dual antiplatelet therapy.  He will need aggressive secondary prevention including avoidance of any illicit substances, or tobacco.  He will need aggressive blood pressure control.  High-dose statin.  Diagnostic Dominance: Right Intervention     EKG:  EKG is  ordered today.  The ekg ordered today demonstrates NSR 85 bpm with TWI in lateral leads improved compared to previous.   Recent Labs: 07/27/2021: B Natriuretic Peptide 791.2 07/28/2021: ALT 41; BUN 15; Creatinine, Ser 1.35; Hemoglobin 13.3; Platelets 279; Potassium 3.6; Sodium 135  Recent Lipid Panel    Component Value Date/Time   CHOL 245 (H) 07/27/2021 0646   TRIG 91 07/27/2021 0646   HDL 47 07/27/2021 0646   CHOLHDL 5.2 07/27/2021 0646   VLDL 18 07/27/2021 0646   LDLCALC 180 (H) 07/27/2021 0646     Home Medications   Current Meds  Medication Sig   aspirin EC 81 MG tablet Take 81 mg by mouth daily. Swallow whole.   atorvastatin (LIPITOR) 80 MG tablet Take 1 tablet (80 mg total) by mouth daily.   carvedilol (COREG) 6.25 MG tablet Take 1 tablet (6.25 mg total) by mouth 2 (two) times daily with a meal.   empagliflozin (JARDIANCE) 10 MG TABS tablet Take 1 tablet (10 mg total) by mouth daily.   isosorbide mononitrate (IMDUR) 30 MG 24 hr tablet Take 1 tablet (30 mg total) by mouth daily.   nitroGLYCERIN (NITROSTAT) 0.4 MG SL tablet Place 1 tablet  (0.4 mg total) under the tongue every 5 (five) minutes x 3 doses as needed for chest pain.   sacubitril-valsartan (ENTRESTO) 24-26 MG Take 1 tablet by mouth 2 (two) times daily.   ticagrelor (BRILINTA) 90 MG TABS tablet Take 1 tablet (90 mg total) by mouth 2 (two) times daily.     Review of Systems      All other systems reviewed and are otherwise negative except as noted above.  Physical Exam    VS:  BP 124/78   Pulse 85   Ht 5\' 7"  (1.702 m)   Wt 155 lb (70.3 kg)   BMI 24.28 kg/m  , BMI Body mass index is 24.28 kg/m.  Wt Readings from Last 3 Encounters:  08/16/21 155 lb (70.3 kg)  07/26/21 180 lb (81.6 kg)     GEN: Well nourished, well developed, in no acute distress. HEENT: normal. Neck: Supple, no JVD, carotid bruits, or masses. Cardiac: RRR, no murmurs, rubs, or gallops. No clubbing, cyanosis, edema.  Radials/PT 2+ and equal bilaterally.  Respiratory:  Respirations regular and unlabored, clear to auscultation bilaterally. GI: Soft, nontender, nondistended. MS: No deformity  or atrophy. L elbow tender on palpation with no hematoma nor ecchymosis. Full ROM.  Skin: Warm and dry, no rash. R radial cath site clean, dry, intact with no hematoma. Neuro:  Strength and sensation are intact. Psych: Normal affect.  Assessment & Plan    CAD s/p NSTEMI and DES - s/p DES to Cx 07/2021. Recommended for DAPT Brilinta/Asa for at least 1 year. Stable with no anginal symptoms. No indication for ischemic evaluation.  GDMT includes aspirin, Brilinta, Coreg, Imdur, Atorvastatin, PRN nitroglycerin. BPM today. R radial cath site healed appropriately. Heart healthy diet and regular cardiovascular exercise encouraged.  Encouraged to participate in cardiac rehab.     Cardiac Rehabilitation Eligibility Assessment  The patient is ready to start cardiac rehabilitation from a cardiac standpoint.    DM2 - 07/2021 A1c 8.2. Started on Jardiance during recent admission. Encouraged to establish with PCP.  Carbohydrate diet friendly diet encouraged.   L elbow pain - Reports L elbow was swollen on discharge but has since improved. Full ROM. Still mildly tender on palpation with no erhythema, hematoma, swelling. He did have IV in L antecubital region and anticipate it was some fluid collection. Encouraged to elevate arm and use heat pack. Reassurance provided.   HLD, LDL goal is an 70  -07/25/2021 total cholesterol 205, atrial 47, LDL 180, triglycerides 91.  Started on atorvastatin 80 mg daily during recent admission. CMP/Lipid in 1 month. If LDL not at goal plan to add Zetia.   HTN - BP well controlled. Continue current antihypertensive regimen.    Ischemic cardiomyopathy / HFrEF - LVEF 40-45% during recent admission. Euvolemic and well compensated on exam. GDMT includes Entresto, Coreg, Imdur, Jardiance. As he is euvolemic and had multiple medication changes during recent admit will defer addition of MRA/loop diuretic. Plan for echo in 3 months to reassess LVEF. Low salt diet, fluid restriction <2L encouraged.   Tobacco use - Smoking cessation encouraged. Recommend utilization of [id number].   Disposition: Follow up in 3 month(s) with Skeet Latch, MD or APP.  Signed, Loel Dubonnet, NP 08/16/2021, 10:46 AM [place]

## 2021-08-16 NOTE — Telephone Encounter (Signed)
Randy Sanchez - PA Case ID: [id number] - Rx #: [id number]  Status Sent to Plan today  Drug Jardiance 10MG  tablets  Form [facility] Medicaid of [place] Prior Authorization Request Form [phone] NCPDP)  Original Claim Info [id number] 01DRUG REQUIRES PRIOR AUTHORIZATION

## 2021-08-16 NOTE — Telephone Encounter (Signed)
*  STAT* If patient is at the pharmacy, call can be transferred to refill team.   1. Which medications need to be refilled? (please list name of each medication and dose if known)  empagliflozin (JARDIANCE) 10 MG TABS tablet  2. Which pharmacy/location (including street and city if local pharmacy) is medication to be sent to? [facility], [place]  3. Do they need a 30 day or 90 day supply? 30 with refills  Patient forgot to ask for this medication to be refilled when he was at his appointment this morning

## 2021-08-17 ENCOUNTER — Other Ambulatory Visit: Payer: Self-pay | Admitting: Pharmacist

## 2021-08-17 ENCOUNTER — Other Ambulatory Visit (HOSPITAL_COMMUNITY): Payer: Self-pay

## 2021-08-17 ENCOUNTER — Telehealth (HOSPITAL_COMMUNITY): Payer: Self-pay | Admitting: Pharmacist

## 2021-08-17 LAB — BASIC METABOLIC PANEL
BUN/Creatinine Ratio: 12 (ref 10–24)
BUN: 12 mg/dL (ref 8–27)
CO2: 20 mmol/L (ref 20–29)
Calcium: 9.2 mg/dL (ref 8.6–10.2)
Chloride: 101 mmol/L (ref 96–106)
Creatinine, Ser: 1.02 mg/dL (ref 0.76–1.27)
Glucose: 126 mg/dL — ABNORMAL HIGH (ref 70–99)
Potassium: 4.4 mmol/L (ref 3.5–5.2)
Sodium: 137 mmol/L (ref 134–144)
eGFR: 83 mL/min/{1.73_m2} (ref >59)

## 2021-08-17 MED ORDER — SACUBITRIL-VALSARTAN 24-26 MG PO TABS: 1.0000 | ORAL_TABLET | Freq: Two times a day (BID) | ORAL | 3 refills | Status: DC

## 2021-08-17 MED ORDER — NITROGLYCERIN 0.4 MG SL SUBL: 0.4000 mg | SUBLINGUAL_TABLET | SUBLINGUAL | 12 refills | Status: DC | PRN

## 2021-08-17 MED ORDER — CARVEDILOL 6.25 MG PO TABS: 6.2500 mg | ORAL_TABLET | Freq: Two times a day (BID) | ORAL | 6 refills | Status: DC

## 2021-08-17 MED ORDER — TICAGRELOR 90 MG PO TABS: 90.0000 mg | ORAL_TABLET | Freq: Two times a day (BID) | ORAL | 11 refills | Status: DC

## 2021-08-17 MED ORDER — ATORVASTATIN CALCIUM 80 MG PO TABS: 80.0000 mg | ORAL_TABLET | Freq: Every day | ORAL | 11 refills | Status: DC

## 2021-08-17 MED ORDER — ISOSORBIDE MONONITRATE ER 30 MG PO TB24: 30.0000 mg | ORAL_TABLET | Freq: Every day | ORAL | 3 refills | Status: DC

## 2021-08-17 NOTE — Telephone Encounter (Signed)
Pharmacy Transitions of Care Follow-up Telephone Call   Date of discharge: 07/28/21  Discharge Diagnosis: NSTEMI   How have you been since you were released from the hospital?  Doing ok   Medication changes made at discharge:    START taking: atorvastatin (LIPITOR)  Brilinta (ticagrelor)  carvedilol (COREG)  Entresto (sacubitril-valsartan)  isosorbide mononitrate (IMDUR)  Jardiance (empagliflozin)  nitroGLYCERIN (NITROSTAT)    Medication changes verified by the patient? Yes     Medication Accessibility:   Home Pharmacy:  [facility]   Was the patient provided with refills on discharged medications? Yes    Have all prescriptions been transferred from [facility] to home pharmacy?  No. Per pt's home pharmacy, they don't want any transfers as they are being audited.    Is the patient able to afford medications? Yes  Notable co-pay: $0/month   Follow-up Appointments:   [facility] f/u appt confirmed? Saw Caitlyn Walker yesterday 08/16/21   If their condition worsens, is the pt aware to call PCP or go to the Emergency Dept.? yes   Final Patient Assessment: -Patient declined medication review of Brilinta - Pt aware that plan for DAPT with Brilinta and ASA for at least 1 year -Reached out to [facility] to send new scripts to patient home pharmacy  Garnet Sierras, PharmD

## 2021-09-02 ENCOUNTER — Telehealth: Payer: Self-pay | Admitting: Cardiovascular Disease

## 2021-09-02 NOTE — Telephone Encounter (Signed)
Spoke to patient 's sister Curtis Sites . She states  she needs prescription sent to the pharmacy. She states she picks of medication and takes it her brother  Mr Heinlen.  NR asked if she would need 90 day ( 3 month supply )  instead of 60 tablets  ( which is for a month supply). She states what ever you think is best. She also states the pharmacist Gaspar Bidding has been assisting as well.  RN  informed Ms.Dalton she will contact [facility]  and speak to the pharmacist  to see what is needed.   RN called [facility] pharmacist- [facility] sent to covermymed  for prior authorization - received  message - that doctor needs to contact insurance -- [phone].  RN  contacted  provider's Office  C. Walker NP/Dr Oval Linsey for   staff to [employer] and offer samples to patient until the issue is resolved. Office will contact Ms Kirk Ruths  to pick up samples for Mr Mcfarlan

## 2021-09-02 NOTE — Telephone Encounter (Signed)
°*  STAT* If patient is at the pharmacy, call can be transferred to refill team.   1. Which medications need to be refilled? (please list name of each medication and dose if known) sacubitril-valsartan (ENTRESTO) 24-26 MG  2. Which pharmacy/location (including street and city if local pharmacy) is medication to be sent to?  [place], [place]  3. Do they need a 30 day or 90 day supply? [place]

## 2021-09-02 NOTE — Telephone Encounter (Signed)
Called patients sister Curtis Sites and left voicemail with instructions to pick up samples of Entresto with free 30 day voucher.    Will forward to Laurann Montana, Np for review!     Samples placed at front desk!

## 2021-09-05 DIAGNOSIS — Z419 Encounter for procedure for purposes other than remedying health state, unspecified: Secondary | ICD-10-CM | POA: Diagnosis not present

## 2021-09-15 DIAGNOSIS — E785 Hyperlipidemia, unspecified: Secondary | ICD-10-CM | POA: Diagnosis not present

## 2021-09-15 DIAGNOSIS — I25118 Atherosclerotic heart disease of native coronary artery with other forms of angina pectoris: Secondary | ICD-10-CM | POA: Diagnosis not present

## 2021-09-16 ENCOUNTER — Telehealth (HOSPITAL_BASED_OUTPATIENT_CLINIC_OR_DEPARTMENT_OTHER): Payer: Self-pay

## 2021-09-16 DIAGNOSIS — E785 Hyperlipidemia, unspecified: Secondary | ICD-10-CM

## 2021-09-16 LAB — COMPREHENSIVE METABOLIC PANEL
ALT: 25 IU/L (ref 0–44)
AST: 17 IU/L (ref 0–40)
Albumin/Globulin Ratio: 1.2 (ref 1.2–2.2)
Albumin: 4.3 g/dL (ref 3.8–4.8)
Alkaline Phosphatase: 119 IU/L (ref 44–121)
BUN/Creatinine Ratio: 12 (ref 10–24)
BUN: 14 mg/dL (ref 8–27)
Bilirubin Total: 0.3 mg/dL (ref 0.0–1.2)
CO2: 21 mmol/L (ref 20–29)
Calcium: 9.7 mg/dL (ref 8.6–10.2)
Chloride: 105 mmol/L (ref 96–106)
Creatinine, Ser: 1.14 mg/dL (ref 0.76–1.27)
Globulin, Total: 3.5 g/dL (ref 1.5–4.5)
Glucose: 166 mg/dL — ABNORMAL HIGH (ref 70–99)
Potassium: 4.3 mmol/L (ref 3.5–5.2)
Sodium: 142 mmol/L (ref 134–144)
Total Protein: 7.8 g/dL (ref 6.0–8.5)
eGFR: 72 mL/min/{1.73_m2} (ref >59)

## 2021-09-16 LAB — HEPATIC FUNCTION PANEL: Bilirubin, Direct: 0.11 mg/dL (ref 0.00–0.40)

## 2021-09-16 NOTE — Telephone Encounter (Addendum)
Called patient to discuss.Lipid Panel ordered, lab slips sent to patient with request to have labs drawn at [facility] at his convenience.     ----- Message from Loel Dubonnet, NP sent at 09/16/2021  7:58 AM EST ----- Normal kidney and liver function.  Normal electrolytes.   Does not appear lipid panel was collected - may have been ordered incorrectly - can we call [facility] to see if they can add it on? Otherwise please ask him to have recollected at his convenience. [facility] Linna Hoff may be closest - could do at time of echo 11/2021

## 2021-09-18 ENCOUNTER — Encounter (HOSPITAL_COMMUNITY): Payer: Self-pay | Admitting: *Deleted

## 2021-09-18 ENCOUNTER — Emergency Department (HOSPITAL_COMMUNITY)
Admission: EM | Admit: 2021-09-18 | Discharge: 2021-09-18 | Disposition: A | Discharge status: Home / Self Care | Payer: Medicaid Other | Attending: Emergency Medicine | Admitting: Emergency Medicine

## 2021-09-18 ENCOUNTER — Emergency Department (HOSPITAL_COMMUNITY): Payer: Medicaid Other

## 2021-09-18 DIAGNOSIS — E119 Type 2 diabetes mellitus without complications: Secondary | ICD-10-CM | POA: Insufficient documentation

## 2021-09-18 DIAGNOSIS — M7989 Other specified soft tissue disorders: Secondary | ICD-10-CM | POA: Diagnosis not present

## 2021-09-18 DIAGNOSIS — R52 Pain, unspecified: Secondary | ICD-10-CM

## 2021-09-18 DIAGNOSIS — Z7982 Long term (current) use of aspirin: Secondary | ICD-10-CM | POA: Insufficient documentation

## 2021-09-18 DIAGNOSIS — L600 Ingrowing nail: Secondary | ICD-10-CM | POA: Diagnosis not present

## 2021-09-18 DIAGNOSIS — I10 Essential (primary) hypertension: Secondary | ICD-10-CM | POA: Insufficient documentation

## 2021-09-18 DIAGNOSIS — L03032 Cellulitis of left toe: Secondary | ICD-10-CM | POA: Insufficient documentation

## 2021-09-18 DIAGNOSIS — Z7984 Long term (current) use of oral hypoglycemic drugs: Secondary | ICD-10-CM | POA: Diagnosis not present

## 2021-09-18 DIAGNOSIS — Z79899 Other long term (current) drug therapy: Secondary | ICD-10-CM | POA: Diagnosis not present

## 2021-09-18 DIAGNOSIS — M79672 Pain in left foot: Secondary | ICD-10-CM | POA: Diagnosis not present

## 2021-09-18 LAB — COMPREHENSIVE METABOLIC PANEL
ALT: 19 U/L (ref 0–44)
AST: 21 U/L (ref 15–41)
Albumin: 3.2 g/dL — ABNORMAL LOW (ref 3.5–5.0)
Alkaline Phosphatase: 76 U/L (ref 38–126)
Anion gap: 4 — ABNORMAL LOW (ref 5–15)
BUN: 12 mg/dL (ref 8–23)
CO2: 25 mmol/L (ref 22–32)
Calcium: 8.8 mg/dL — ABNORMAL LOW (ref 8.9–10.3)
Chloride: 111 mmol/L (ref 98–111)
Creatinine, Ser: 0.94 mg/dL (ref 0.61–1.24)
GFR, Estimated: >60 mL/min (ref >60)
Glucose, Bld: 144 mg/dL — ABNORMAL HIGH (ref 70–99)
Potassium: 4.2 mmol/L (ref 3.5–5.1)
Sodium: 140 mmol/L (ref 135–145)
Total Bilirubin: 0.5 mg/dL (ref 0.3–1.2)
Total Protein: 7.3 g/dL (ref 6.5–8.1)

## 2021-09-18 LAB — CBC WITH DIFFERENTIAL/PLATELET
Abs Immature Granulocytes: 0.01 10*3/uL (ref 0.00–0.07)
Basophils Absolute: 0.1 10*3/uL (ref 0.0–0.1)
Basophils Relative: 1 %
Eosinophils Absolute: 0.2 10*3/uL (ref 0.0–0.5)
Eosinophils Relative: 3 %
HCT: 35.6 % — ABNORMAL LOW (ref 39.0–52.0)
Hemoglobin: 11.5 g/dL — ABNORMAL LOW (ref 13.0–17.0)
Immature Granulocytes: 0 %
Lymphocytes Relative: 36 %
Lymphs Abs: 2.5 10*3/uL (ref 0.7–4.0)
MCH: 27.6 pg (ref 26.0–34.0)
MCHC: 32.3 g/dL (ref 30.0–36.0)
MCV: 85.4 fL (ref 80.0–100.0)
Monocytes Absolute: 0.7 10*3/uL (ref 0.1–1.0)
Monocytes Relative: 10 %
Neutro Abs: 3.6 10*3/uL (ref 1.7–7.7)
Neutrophils Relative %: 50 %
Platelets: 394 10*3/uL (ref 150–400)
RBC: 4.17 MIL/uL — ABNORMAL LOW (ref 4.22–5.81)
RDW: 14.7 % (ref 11.5–15.5)
WBC: 7.1 10*3/uL (ref 4.0–10.5)
nRBC: 0 % (ref 0.0–0.2)

## 2021-09-18 LAB — TROPONIN I (HIGH SENSITIVITY)
Troponin I (High Sensitivity): 14 ng/L (ref <18)
Troponin I (High Sensitivity): 17 ng/L (ref <18)

## 2021-09-18 LAB — LACTIC ACID, PLASMA
Lactic Acid, Venous: 1.4 mmol/L (ref 0.5–1.9)
Lactic Acid, Venous: 1.1 mmol/L (ref 0.5–1.9)

## 2021-09-18 LAB — BRAIN NATRIURETIC PEPTIDE: B Natriuretic Peptide: 272 pg/mL — ABNORMAL HIGH (ref 0.0–100.0)

## 2021-09-18 MED ORDER — MORPHINE SULFATE (PF) 4 MG/ML IV SOLN
4.0000 mg | Freq: Once | INTRAVENOUS | Status: AC
  Administered 2021-09-18: 4 mg via INTRAVENOUS
  Filled 2021-09-18: qty 1

## 2021-09-18 MED ORDER — OXYCODONE-ACETAMINOPHEN 5-325 MG PO TABS
1.0000 | ORAL_TABLET | Freq: Four times a day (QID) | ORAL | 0 refills | Status: DC | PRN
Start: 2021-09-18 — End: 2021-10-19

## 2021-09-18 MED ORDER — SULFAMETHOXAZOLE-TRIMETHOPRIM 800-160 MG PO TABS
1.0000 | ORAL_TABLET | Freq: Once | ORAL | Status: AC
  Administered 2021-09-18: 1 via ORAL
  Filled 2021-09-18: qty 1

## 2021-09-18 MED ORDER — PIPERACILLIN-TAZOBACTAM 3.375 G IVPB 30 MIN: 3.3750 g | Freq: Once | INTRAVENOUS | Status: DC

## 2021-09-18 MED ORDER — SULFAMETHOXAZOLE-TRIMETHOPRIM 800-160 MG PO TABS
1.0000 | ORAL_TABLET | Freq: Two times a day (BID) | ORAL | 0 refills | Status: AC
Start: 2021-09-18 — End: 2021-09-25

## 2021-09-18 NOTE — Discharge Instructions (Addendum)
You were seen here today for your foot pain.  You were found to have an ingrown toenail of your left great toe as well as surrounding infection.  For this reason you have been prescribed antibiotics which you should take in the outpatient setting consistently for the entire course. Please follow-up with your primary care doctor and return to the ER if you develop any increased swelling or redness to the foot, fevers, chills, or any other new severe symptom.

## 2021-09-18 NOTE — ED Triage Notes (Signed)
Left foot pain and swelling for 14 days

## 2021-09-18 NOTE — ED Provider Notes (Signed)
[facility] Provider Note   CSN: [id number] Arrival date & time: 09/18/21  1144     History  Chief Complaint  Patient presents with   Foot Pain    Randy Sanchez is a 66 y.o. male who presents with concern for approximately 2 weeks of progressively worsening left foot pain challenging to walk due to the pain.  Endorses mild swelling of the foot as well but denies any fevers, chills, nausea, vomiting, denies known injury to the foot.  Denies any swelling of the legs or calf pain.  Does endorse some dyspnea on exertion as well but states that this is baseline for him after his  NSTEMI with stent placement in November 2022.  He has currently on dual antiplatelet therapy with aspirin and Brilinta. I have personally reviewed this patient's medical records. He has history of DM, hyperlipidemia, HTN, and ischemic cardiomyopathy.   HPI     Home Medications Prior to Admission medications   Medication Sig Start Date End Date Taking? Authorizing Provider  aspirin EC 81 MG tablet Take 81 mg by mouth daily. Swallow whole.   Yes [provider]  atorvastatin (LIPITOR) 80 MG tablet Take 1 tablet (80 mg total) by mouth daily. 08/17/21  Yes Loel Dubonnet, NP  carvedilol (COREG) 6.25 MG tablet Take 1 tablet (6.25 mg total) by mouth 2 (two) times daily with a meal. 08/17/21  Yes Loel Dubonnet, NP  empagliflozin (JARDIANCE) 10 MG TABS tablet Take 1 tablet (10 mg total) by mouth daily. 08/16/21  Yes Loel Dubonnet, NP  ibuprofen (ADVIL) 800 MG tablet Take 800 mg by mouth every 8 (eight) hours as needed for moderate pain.   Yes [provider]  isosorbide mononitrate (IMDUR) 30 MG 24 hr tablet Take 1 tablet (30 mg total) by mouth daily. 08/17/21  Yes Loel Dubonnet, NP  nitroGLYCERIN (NITROSTAT) 0.4 MG SL tablet Place 1 tablet (0.4 mg total) under the tongue every 5 (five) minutes x 3 doses as needed for chest pain. 08/17/21  Yes Loel Dubonnet, NP   oxyCODONE-acetaminophen (PERCOCET/ROXICET) 5-325 MG tablet Take 1 tablet by mouth every 6 (six) hours as needed for severe pain. 09/18/21  Yes Endrit Gittins R, PA-C  sacubitril-valsartan (ENTRESTO) 24-26 MG Take 1 tablet by mouth 2 (two) times daily. 08/17/21  Yes Loel Dubonnet, NP  sulfamethoxazole-trimethoprim (BACTRIM DS) 800-160 MG tablet Take 1 tablet by mouth 2 (two) times daily for 7 days. 09/18/21 09/25/21 Yes Noemi Ishmael, Gypsy Balsam, PA-C  ticagrelor (BRILINTA) 90 MG TABS tablet Take 1 tablet (90 mg total) by mouth 2 (two) times daily. 08/17/21  Yes Loel Dubonnet, NP      Allergies    Patient has no known allergies.    Review of Systems   Review of Systems  Constitutional: Negative.   HENT: Negative.    Respiratory: Negative.    Cardiovascular: Negative.  Negative for chest pain and palpitations.       DOE w/ exertion  Gastrointestinal: Negative.   Genitourinary: Negative.   Musculoskeletal:        Foot pain and swelling  Skin: Negative.   Neurological: Negative.  Negative for weakness.  Psychiatric/Behavioral: Negative.     Physical Exam Updated Vital Signs BP 116/73    Pulse 83    Temp 97.7 F (36.5 C) (Oral)    Resp 17    SpO2 100%  Physical Exam Vitals and nursing note reviewed.  Constitutional:  Appearance: He is not ill-appearing or toxic-appearing.  HENT:     Head: Normocephalic and atraumatic.     Nose: Nose normal. No congestion.     Mouth/Throat:     Mouth: Mucous membranes are moist.     Pharynx: Oropharynx is clear. Uvula midline. No oropharyngeal exudate or posterior oropharyngeal erythema.  Eyes:     General: Lids are normal. Vision grossly intact.        Right eye: No discharge.        Left eye: No discharge.     Extraocular Movements: Extraocular movements intact.     Conjunctiva/sclera: Conjunctivae normal.     Pupils: Pupils are equal, round, and reactive to light.  Neck:     Trachea: Trachea and phonation normal.   Cardiovascular:     Rate and Rhythm: Normal rate and regular rhythm.     Pulses: Normal pulses.          Dorsalis pedis pulses are 2+ on the right side and 2+ on the left side.     Heart sounds: Normal heart sounds. No murmur heard.    Comments: No calf tenderness, swelling, or redness bilaterally. Pulmonary:     Effort: Pulmonary effort is normal. No tachypnea, bradypnea, accessory muscle usage, prolonged expiration or respiratory distress.     Breath sounds: Normal breath sounds. No wheezing or rales.  Chest:     Chest wall: No mass, lacerations, deformity, swelling, tenderness, crepitus or edema.  Abdominal:     General: Bowel sounds are normal. There is no distension.     Palpations: Abdomen is soft.     Tenderness: There is no abdominal tenderness. There is no right CVA tenderness, left CVA tenderness, guarding or rebound.  Musculoskeletal:        General: No deformity.     Cervical back: Normal range of motion and neck supple.     Right lower leg: No edema.     Left lower leg: No edema.       Feet:  Feet:     Left foot:     Toenail Condition: Left toenails are ingrown.  Lymphadenopathy:     Cervical: No cervical adenopathy.  Skin:    General: Skin is warm and dry.     Capillary Refill: Capillary refill takes less than 2 seconds.     Findings: No rash.  Neurological:     General: No focal deficit present.     Mental Status: He is alert and oriented to person, place, and time. Mental status is at baseline.  Psychiatric:        Mood and Affect: Mood normal.    ED Results / Procedures / Treatments   Labs (all labs ordered are listed, but only abnormal results are displayed) Labs Reviewed  COMPREHENSIVE METABOLIC PANEL - Abnormal; Notable for the following components:      Result Value   Glucose, Bld 144 (*)    Calcium 8.8 (*)    Albumin 3.2 (*)    Anion gap 4 (*)    All other components within normal limits  BRAIN NATRIURETIC PEPTIDE - Abnormal; Notable for the  following components:   B Natriuretic Peptide 272.0 (*)    All other components within normal limits  CBC WITH DIFFERENTIAL/PLATELET - Abnormal; Notable for the following components:   RBC 4.17 (*)    Hemoglobin 11.5 (*)    HCT 35.6 (*)    All other components within normal limits  LACTIC ACID, PLASMA  LACTIC  ACID, PLASMA  TROPONIN I (HIGH SENSITIVITY)  TROPONIN I (HIGH SENSITIVITY)    EKG None  Radiology DG Foot Complete Left  Result Date: 09/18/2021 CLINICAL DATA:  Left foot pain and swelling for 14 days. No history of injury. EXAM: LEFT FOOT - COMPLETE 3+ VIEW COMPARISON:  None. FINDINGS: There is no evidence of fracture or dislocation. There is no evidence of arthropathy or other focal bone abnormality. Soft tissues are unremarkable. IMPRESSION: Negative. Electronically Signed   By: Fidela Salisbury M.D.   On: 09/18/2021 14:55    Procedures Procedures    Medications Ordered in ED Medications  morphine 4 MG/ML injection 4 mg (4 mg Intravenous Given 09/18/21 1630)  sulfamethoxazole-trimethoprim (BACTRIM DS) 800-160 MG per tablet 1 tablet (1 tablet Oral Given 09/18/21 1726)    ED Course/ Medical Decision Making/ A&P                           Medical Decision Making 67 year old male presents with concern for left foot pain for the last few weeks.  Additionally has some dyspnea on exertion but states this is at his baseline, not a concern at this time.  Patient with hypertension on intake, vitals otherwise normal.  Cardiopulmonary exam is normal, abdominal exam is benign.  No calf tenderness to palpation bilaterally.  Patient does have ingrown toenail on the left great toe with slight cellulitic changes of the left great toe or left foot without area of fluctuance to suggest abscess amenable to drainage.  Amount and/or Complexity of Data Reviewed Labs: ordered.    Details: CBC unremarkable with anemia at baseline with hemoglobin 11.5.  CMP unremarkable, BMP unremarkable,  troponin is negative x2 and lactic acid is normal Radiology: ordered.    Details: plain film of the left foot is unremarkable ECG/medicine tests:     Details: EKG sinus rhythm without STEMI.  Risk Prescription drug management.   HPI and physical exam most consistent with ingrown toenail of the left great toe with associated cellulitis of the foot.  First dose of antibiotics administered in the emergency department.  No indication for admission at this time given reassuring labs, vital signs, and well-appearing patient.  Will discharge with oral antibiotics and close outpatient follow-up is recommended.  Kalijah voiced understanding of his medical evaluation and treatment plan.  Each of his questions was answered to his expressed.  Return precautions were given.  Patient is well-appearing, stable, and appropriate for discharge at this time.  This chart was dictated using voice recognition software, Dragon. Despite the best efforts of this provider to proofread and correct errors, errors may still occur which can change documentation meaning.  Final Clinical Impression(s) / ED Diagnoses Final diagnoses:  Ingrown toenail with infection    Rx / DC Orders ED Discharge Orders          Ordered    sulfamethoxazole-trimethoprim (BACTRIM DS) 800-160 MG tablet  2 times daily        09/18/21 1703    oxyCODONE-acetaminophen (PERCOCET/ROXICET) 5-325 MG tablet  Every 6 hours PRN        09/18/21 1719              Lesleyann Fichter, Gypsy Balsam, PA-C 09/22/21 6720    Sherwood Gambler, MD 09/22/21 [phone]

## 2021-09-18 NOTE — ED Notes (Signed)
Pt d/c home per MD order. Discharge summary reviewed with pt, pt verbalizes understanding. No s/s of acute distress noted at discharge. Off unit via WC. Pt sister is his discharge ride home.

## 2021-10-01 ENCOUNTER — Telehealth: Payer: Self-pay | Admitting: Cardiovascular Disease

## 2021-10-01 NOTE — Telephone Encounter (Signed)
Called patient pharmacy.  Per their report they provided him a 30-day supply 1 week ago with free coupon.  Prior auth submitted today. Insurance typically takes 24 to 72 hours to respond.  Spoke with patient sister. He has 8 tablets left which will get him through the weekend.   We will check prior auth status Monday morning. If approved, call patient and tell him to pick up.  If not approved, transition to losartan 25 mg daily while awaiting response.   Loel Dubonnet, NP

## 2021-10-01 NOTE — Telephone Encounter (Signed)
Please help.

## 2021-10-01 NOTE — Telephone Encounter (Signed)
Patient's sister called saying his insurance won't cover his Delene Loll.  She wants to know if he can be put on something else that the insurance will cover.  Patient only has a couple of pills left of the Entresto.

## 2021-10-01 NOTE — Telephone Encounter (Signed)
Marcello Moores KeyGeraldine Solar - PA Case ID: [id number] - Rx #: [id number]  Status Sent to Plan today  Drug Entresto 24-26MG  tablets  Form [facility] Medicaid of [place] Prior Authorization Request Form [id number] NCPDP)  Original Claim Info 33 01PA REQUIRED. PLEASE CALL [phone] 02FOR 3 DS O/R, USE PAMC 4 [id number]

## 2021-10-04 NOTE — Telephone Encounter (Signed)
PA for Entresto approved through 10/01/22. Spoke with pharmacy who confirmed the PA went through. Made patient aware that available at pharmacy for pick up.   Loel Dubonnet, NP

## 2021-10-06 DIAGNOSIS — Z419 Encounter for procedure for purposes other than remedying health state, unspecified: Secondary | ICD-10-CM | POA: Diagnosis not present

## 2021-10-18 DIAGNOSIS — H5213 Myopia, bilateral: Secondary | ICD-10-CM | POA: Diagnosis not present

## 2021-10-18 LAB — HM DIABETES EYE EXAM

## 2021-10-19 ENCOUNTER — Encounter: Payer: Self-pay | Admitting: Family Medicine

## 2021-10-19 ENCOUNTER — Ambulatory Visit (INDEPENDENT_AMBULATORY_CARE_PROVIDER_SITE_OTHER): Payer: Medicaid Other | Admitting: Family Medicine

## 2021-10-19 VITALS — BP 115/58 | HR 70 | Temp 97.1°F | Ht 67.0 in | Wt 148.0 lb

## 2021-10-19 DIAGNOSIS — E1169 Type 2 diabetes mellitus with other specified complication: Secondary | ICD-10-CM | POA: Diagnosis not present

## 2021-10-19 DIAGNOSIS — R6889 Other general symptoms and signs: Secondary | ICD-10-CM | POA: Diagnosis not present

## 2021-10-19 DIAGNOSIS — I10 Essential (primary) hypertension: Secondary | ICD-10-CM | POA: Diagnosis not present

## 2021-10-19 DIAGNOSIS — I152 Hypertension secondary to endocrine disorders: Secondary | ICD-10-CM

## 2021-10-19 DIAGNOSIS — I255 Ischemic cardiomyopathy: Secondary | ICD-10-CM | POA: Diagnosis not present

## 2021-10-19 DIAGNOSIS — E119 Type 2 diabetes mellitus without complications: Secondary | ICD-10-CM | POA: Diagnosis not present

## 2021-10-19 DIAGNOSIS — E785 Hyperlipidemia, unspecified: Secondary | ICD-10-CM | POA: Diagnosis not present

## 2021-10-19 DIAGNOSIS — E1159 Type 2 diabetes mellitus with other circulatory complications: Secondary | ICD-10-CM

## 2021-10-19 DIAGNOSIS — I2510 Atherosclerotic heart disease of native coronary artery without angina pectoris: Secondary | ICD-10-CM | POA: Diagnosis not present

## 2021-10-19 LAB — LIPID PANEL

## 2021-10-19 LAB — BAYER DCA HB A1C WAIVED: HB A1C (BAYER DCA - WAIVED): 6.9 % — ABNORMAL HIGH (ref 4.8–5.6)

## 2021-10-19 NOTE — Progress Notes (Signed)
Subjective:  Patient ID: Randy Sanchez, male    DOB: 09-Jan-1956, 66 y.o.   MRN: [id number]  Patient Care Team: Baruch Gouty, FNP as PCP - General (Family Medicine) Skeet Latch, MD as PCP - Cardiology (Cardiology)   Chief Complaint:  Establish Care   HPI: Randy Sanchez is a 66 y.o. male presenting on 10/19/2021 for Establish Care  Pt presents today to establish care with new PCP as he has not been seen by one in several years. He was recently admitted to [facility] for NSTEMI. He has followed up with cardiology since discharge home on 07/28/2021. He has been complaint with medications and denies adverse side effects. He has diabetes with associated cardiovascular disease, hyperlipidemia, and hypertension.   1. Type 2 diabetes mellitus with other specified complication, without long-term current use of insulin ([place]) He was restarted on Jardiance during hospital admission and has been tolerating well. He does not check blood sugar at home.   2. Hyperlipidemia associated with type 2 diabetes mellitus ([place]), goal LDL < 70 He was started on high dose statin therapy during hospital admission and has been complaint with medications. No myalgias reported.   3. Hypertension associated with type 2 diabetes mellitus ([place]) He is currently on Entresto, Imdur, and Coreg. States he does have slight dizziness at times when he stands. Does not cause near syncope or syncope. Feels it happens after his Coreg dosing.   4. Coronary artery disease involving native coronary artery of native heart without angina pectoris 5. Ischemic cardiomyopathy NSTEMI 07/26/2021. Went to ED with complaints of chest pain after a night of partying and using Cocaine. Troponin was elevated at 866 and peaked at [place]. He was admitted and taken to cath lab next day. Echo post cath revealed LVEF 40-45% and hypokinesis of inferolateral and mid anterolateral walls.  CATH: 07/27/2021   1st Mrg-1 lesion is 100% stenosed.   Releatively small vessel. Culprit for MI.  Balloon angioplasty was performed using a BALLN SAPPHIRE 2.0X15.   Post intervention, there is a 25% residual stenosis.   1st Mrg-2 lesion is 75% stenosed.  Balloon angioplasty was performed using a BALLN SAPPHIRE 2.0X15.   Post intervention, there is a 25% residual stenosis.   Ost LAD to Prox LAD lesion is 25% stenosed.   Mid Cx lesion is 80% stenosed.   A drug-eluting stent was successfully placed using a STENT ONYX FRONTIER 3.0X15, postdilated to 3.25 mm.   Post intervention, there is a 0% residual stenosis.   2nd Diag lesion is 80% stenosed.   RPDA-2 lesion is 50% stenosed.   RPDA-1 lesion is 50% stenosed.   Prox RCA lesion is 25% stenosed.   There is mild to moderate left ventricular systolic dysfunction.   LV end diastolic pressure is low.   The left ventricular ejection fraction is 35-45% by visual estimate.   There is no aortic valve stenosis. Doing well since discharge. Has been compliant with medications, has been taking DAPT therapy without reports of abnormal bleeding or bruising.   6. 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 He denies visual changes.      Relevant past medical, surgical, family, and social history reviewed and updated as indicated.  Allergies and medications reviewed and updated. Data reviewed: Chart in Epic.   Past Medical History:  Diagnosis Date   Diabetes mellitus type II, non insulin dependent ([place]) 07/28/2021   HTN,  goal below 130/80 07/28/2021   Hyperlipidemia associated with type 2 diabetes mellitus ([place]), goal LDL < 70 07/28/2021   Ischemic cardiomyopathy 07/28/2021   NSTEMI (non-ST elevated myocardial infarction) ([place]) 07/27/2021    Past Surgical History:  Procedure Laterality Date   CORONARY STENT INTERVENTION N/A 07/27/2021   Procedure: CORONARY STENT INTERVENTION;  Surgeon: Jettie Booze, MD;  Location: [place] [facility];  Service: Cardiovascular;  Laterality: N/A;   LEFT HEART CATH AND CORONARY ANGIOGRAPHY N/A 07/27/2021   Procedure: LEFT HEART CATH AND CORONARY ANGIOGRAPHY;  Surgeon: Jettie Booze, MD;  Location: [place] [facility];  Service: Cardiovascular;  Laterality: N/A;    Social History   Socioeconomic History   Marital status: Widowed    Spouse name: Not on file   Number of children: Not on file   Years of education: Not on file   Highest education level: Not on file  Occupational History   Not on file  Tobacco Use   Smoking status: Some Days    Types: Cigarettes   Smokeless tobacco: Never  Vaping Use   Vaping Use: Never used  Substance and Sexual Activity   Alcohol use: Not Currently    Alcohol/week: 2.0 standard drinks    Types: 2 Shots of liquor per week    Comment: 1-2 bottles of beer   Drug use: Not Currently    Types: Cocaine, Marijuana    Comment: last week   Sexual activity: Not on file  Other Topics Concern   Not on file  Social History Narrative   Not on file   Social Determinants of Health   Financial Resource Strain: Not on file  Food Insecurity: Not on file  Transportation Needs: Not on file  Physical Activity: Not on file  Stress: Not on file  Social Connections: Not on file  Intimate Partner Violence: Not on file    Outpatient Encounter Medications as of 10/19/2021  Medication Sig   aspirin EC 81 MG tablet Take 81 mg by mouth daily. Swallow whole.   atorvastatin (LIPITOR) 80 MG tablet Take 1 tablet (80 mg total) by mouth daily.   carvedilol (COREG) 6.25 MG tablet Take 1 tablet (6.25 mg total) by mouth 2 (two) times daily with a meal.   empagliflozin (JARDIANCE) 10 MG TABS tablet Take 1 tablet (10 mg total) by mouth daily.   isosorbide mononitrate (IMDUR) 30 MG 24 hr tablet Take 1 tablet (30 mg total) by mouth daily.   nitroGLYCERIN (NITROSTAT) 0.4 MG SL tablet Place 1 tablet (0.4 mg total) under the tongue every 5 (five) minutes x 3  doses as needed for chest pain.   sacubitril-valsartan (ENTRESTO) 24-26 MG Take 1 tablet by mouth 2 (two) times daily.   ticagrelor (BRILINTA) 90 MG TABS tablet Take 1 tablet (90 mg total) by mouth 2 (two) times daily.   [DISCONTINUED] ibuprofen (ADVIL) 800 MG tablet Take 800 mg by mouth every 8 (eight) hours as needed for moderate pain.   [DISCONTINUED] oxyCODONE-acetaminophen (PERCOCET/ROXICET) 5-325 MG tablet Take 1 tablet by mouth every 6 (six) hours as needed for severe pain.   No facility-administered encounter medications on file as of 10/19/2021.    No Known Allergies  Review of Systems  Constitutional:  Negative for activity change, appetite change, chills, diaphoresis, fatigue, fever and unexpected weight change.  HENT: Negative.    Eyes: Negative.   Respiratory:  Negative for cough, chest tightness and shortness of breath.   Cardiovascular:  Negative for chest pain,  palpitations and leg swelling.  Gastrointestinal:  Negative for abdominal distention, abdominal pain, anal bleeding, blood in stool, constipation, diarrhea, nausea, rectal pain and vomiting.  Endocrine: Negative.  Negative for polydipsia, polyphagia and polyuria.  Genitourinary:  Negative for decreased urine volume, difficulty urinating, dysuria, frequency, hematuria and urgency.  Musculoskeletal:  Negative for arthralgias and myalgias.  Skin: Negative.   Allergic/Immunologic: Negative.   Neurological:  Negative for dizziness, tremors, seizures, syncope, facial asymmetry, speech difficulty, weakness, light-headedness, numbness and headaches.  Hematological: Negative.   Psychiatric/Behavioral:  Negative for confusion, hallucinations, sleep disturbance and suicidal ideas.   All other systems reviewed and are negative.      Objective:  BP (!) 115/58    Pulse 70    Temp (!) 97.1 F (36.2 C) (Temporal)    Ht _0  (1.702 m)    Wt 148 lb (67.1 kg)    SpO2 96%    BMI 23.18 kg/m    Wt Readings from Last 3 Encounters:   10/19/21 148 lb (67.1 kg)  08/16/21 155 lb (70.3 kg)  07/26/21 180 lb (81.6 kg)    Physical Exam Vitals and nursing note reviewed.  Constitutional:      General: He is not in acute distress.    Appearance: Normal appearance. He is well-developed, well-groomed and normal weight. He is not ill-appearing, toxic-appearing or diaphoretic.  HENT:     Head: Normocephalic and atraumatic.     Jaw: There is normal jaw occlusion.     Right Ear: Hearing normal.     Left Ear: Hearing normal.     Nose: Nose normal.     Mouth/Throat:     Lips: Pink.     Mouth: Mucous membranes are moist.     Pharynx: Oropharynx is clear. Uvula midline.  Eyes:     General: Lids are normal.     Extraocular Movements: Extraocular movements intact.     Conjunctiva/sclera: Conjunctivae normal.     Pupils: Pupils are equal, round, and reactive to light.  Neck:     Thyroid: No thyroid mass, thyromegaly or thyroid tenderness.     Vascular: No carotid bruit or JVD.     Trachea: Trachea and phonation normal.  Cardiovascular:     Rate and Rhythm: Normal rate and regular rhythm.     Chest Wall: PMI is not displaced.     Pulses: Normal pulses.          Dorsalis pedis pulses are 2+ on the right side and 2+ on the left side.       Posterior tibial pulses are 2+ on the right side and 2+ on the left side.     Heart sounds: Normal heart sounds. No murmur heard.   No friction rub. No gallop.  Pulmonary:     Effort: Pulmonary effort is normal. No respiratory distress.     Breath sounds: Normal breath sounds. No wheezing.  Abdominal:     General: Bowel sounds are normal. There is no distension or abdominal bruit.     Palpations: Abdomen is soft. There is no hepatomegaly or splenomegaly.     Tenderness: There is no abdominal tenderness. There is no right CVA tenderness or left CVA tenderness.     Hernia: No hernia is present.  Musculoskeletal:        General: Normal range of motion.     Cervical back: Normal range of  motion and neck supple.     Right lower leg: No edema.  Left lower leg: No edema.  Feet:     Right foot:     Protective Sensation: 10 sites tested.  10 sites sensed.     Skin integrity: Callus and dry skin present.     Left foot:     Protective Sensation: 10 sites tested.  10 sites sensed.     Skin integrity: Callus and dry skin present.  Lymphadenopathy:     Cervical: No cervical adenopathy.  Skin:    General: Skin is warm and dry.     Capillary Refill: Capillary refill takes less than 2 seconds.     Coloration: Skin is not cyanotic, jaundiced or pale.     Findings: No rash.  Neurological:     General: No focal deficit present.     Mental Status: He is alert and oriented to person, place, and time.     Sensory: Sensation is intact.     Motor: Motor function is intact.     Coordination: Coordination is intact.     Gait: Gait is intact.     Deep Tendon Reflexes: Reflexes are normal and symmetric.  Psychiatric:        Attention and Perception: Attention and perception normal.        Mood and Affect: Mood and affect normal.        Speech: Speech normal.        Behavior: Behavior normal. Behavior is cooperative.        Thought Content: Thought content normal.        Cognition and Memory: Cognition and memory normal.        Judgment: Judgment normal.    Results for orders placed or performed during the hospital encounter of 09/18/21  Comprehensive metabolic panel  Result Value Ref Range   Sodium 140 135 - 145 mmol/L   Potassium 4.2 3.5 - 5.1 mmol/L   Chloride 111 98 - 111 mmol/L   CO2 25 22 - 32 mmol/L   Glucose, Bld 144 (H) 70 - 99 mg/dL   BUN 12 8 - 23 mg/dL   Creatinine, Ser 0.94 0.61 - 1.24 mg/dL   Calcium 8.8 (L) 8.9 - 10.3 mg/dL   Total Protein 7.3 6.5 - 8.1 g/dL   Albumin 3.2 (L) 3.5 - 5.0 g/dL   AST 21 15 - 41 U/L   ALT 19 0 - 44 U/L   Alkaline Phosphatase 76 38 - 126 U/L   Total Bilirubin 0.5 0.3 - 1.2 mg/dL   GFR, Estimated >60 >60 mL/min   Anion gap 4 (L)  5 - 15  Brain natriuretic peptide  Result Value Ref Range   B Natriuretic Peptide 272.0 (H) 0.0 - 100.0 pg/mL  CBC with Differential  Result Value Ref Range   WBC 7.1 4.0 - 10.5 K/uL   RBC 4.17 (L) 4.22 - 5.81 MIL/uL   Hemoglobin 11.5 (L) 13.0 - 17.0 g/dL   HCT 35.6 (L) 39.0 - 52.0 %   MCV 85.4 80.0 - 100.0 fL   MCH 27.6 26.0 - 34.0 pg   MCHC 32.3 30.0 - 36.0 g/dL   RDW 14.7 11.5 - 15.5 %   Platelets 394 150 - 400 K/uL   nRBC 0.0 0.0 - 0.2 %   Neutrophils Relative % 50 %   Neutro Abs 3.6 1.7 - 7.7 K/uL   Lymphocytes Relative 36 %   Lymphs Abs 2.5 0.7 - 4.0 K/uL   Monocytes Relative 10 %   Monocytes Absolute 0.7 0.1 - 1.0 K/uL  Eosinophils Relative 3 %   Eosinophils Absolute 0.2 0.0 - 0.5 K/uL   Basophils Relative 1 %   Basophils Absolute 0.1 0.0 - 0.1 K/uL   Immature Granulocytes 0 %   Abs Immature Granulocytes 0.01 0.00 - 0.07 K/uL  Lactic acid, plasma  Result Value Ref Range   Lactic Acid, Venous 1.4 0.5 - 1.9 mmol/L  Lactic acid, plasma  Result Value Ref Range   Lactic Acid, Venous 1.1 0.5 - 1.9 mmol/L  Troponin I (High Sensitivity)  Result Value Ref Range   Troponin I (High Sensitivity) 14 <18 ng/L  Troponin I (High Sensitivity)  Result Value Ref Range   Troponin I (High Sensitivity) 17 <18 ng/L       Pertinent labs & imaging results that were available during my care of the patient were reviewed by me and considered in my medical decision making.  Assessment & Plan:  English was seen today for establish care.  Diagnoses and all orders for this visit:  Type 2 diabetes mellitus with other specified complication, without long-term current use of insulin ([place]) A1C 6.9 in office. Continue current regimen. Referral to podiatry placed. Referral to ophthalmology placed. Other labs pending. Diet and exercise encouraged.  -     Bayer DCA Hb A1c Waived -     Lipid panel -     CBC with Differential/Platelet -     CMP14+EGFR -     Microalbumin / creatinine urine  ratio -     Ambulatory referral to Ophthalmology -     Ambulatory referral to Podiatry  Hyperlipidemia associated with type 2 diabetes mellitus ([place]), goal LDL < 70 Diet encouraged - increase intake of fresh fruits and vegetables, increase intake of lean proteins. Bake, broil, or grill foods. Avoid fried, greasy, and fatty foods. Avoid fast foods. Increase intake of fiber-rich whole grains. Exercise encouraged - at least 150 minutes per week and advance as tolerated.  Goal BMI < 25. Continue medications as prescribed. Follow up in 3-6 months as discussed.  -     Lipid panel -     CMP14+EGFR  Hypertension associated with type 2 diabetes mellitus (HCC) BP well controlled. Changes were not made in regimen today. Goal BP is 130/80. Pt aware to report any persistent high or low readings. DASH diet and exercise encouraged. Exercise at least 150 minutes per week and increase as tolerated. Goal BMI > 25. Stress management encouraged. Avoid nicotine and tobacco product use. Avoid excessive alcohol and NSAID's. Avoid more than 2000 mg of sodium daily. Medications as prescribed. Follow up as scheduled.  -     Lipid panel -     CBC with Differential/Platelet -     CMP14+EGFR -     Microalbumin / creatinine urine ratio  Coronary artery disease involving native coronary artery of native heart without angina pectoris Ischemic cardiomyopathy  Followed by cardiology. Will obtain baseline labs today. May need to decrease BB dosing due to dizziness. Pt to discuss with cardiology.  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  Follow up plan: Return in about 1 month (around 11/16/2021), or if symptoms worsen or fail to improve, for CPE.   Continue healthy lifestyle choices, including diet (rich  in fruits, vegetables, and  lean proteins, and low in salt and simple carbohydrates) and exercise (at least 30 minutes of moderate physical activity daily).  Educational handout given for DM  The above assessment and management plan was discussed with the patient. The patient verbalized understanding of and has agreed to the management plan. Patient is aware to call the clinic if they develop any new symptoms or if symptoms persist or worsen. Patient is aware when to return to the clinic for a follow-up visit. Patient educated on when it is appropriate to go to the emergency department.   Monia Pouch, FNP-C [facility] [phone]

## 2021-10-19 NOTE — Patient Instructions (Signed)

## 2021-10-20 LAB — CMP14+EGFR
ALT: 39 IU/L (ref 0–44)
AST: 36 IU/L (ref 0–40)
Albumin/Globulin Ratio: 1.7 (ref 1.2–2.2)
Albumin: 4.5 g/dL (ref 3.8–4.8)
Alkaline Phosphatase: 87 IU/L (ref 44–121)
BUN/Creatinine Ratio: 12 (ref 10–24)
BUN: 13 mg/dL (ref 8–27)
Bilirubin Total: 0.3 mg/dL (ref 0.0–1.2)
CO2: 22 mmol/L (ref 20–29)
Calcium: 9.6 mg/dL (ref 8.6–10.2)
Chloride: 107 mmol/L — ABNORMAL HIGH (ref 96–106)
Creatinine, Ser: 1.12 mg/dL (ref 0.76–1.27)
Globulin, Total: 2.7 g/dL (ref 1.5–4.5)
Glucose: 129 mg/dL — ABNORMAL HIGH (ref 70–99)
Potassium: 4.5 mmol/L (ref 3.5–5.2)
Sodium: 142 mmol/L (ref 134–144)
Total Protein: 7.2 g/dL (ref 6.0–8.5)
eGFR: 74 mL/min/{1.73_m2} (ref >59)

## 2021-10-20 LAB — CBC WITH DIFFERENTIAL/PLATELET
Basophils Absolute: 0.1 10*3/uL (ref 0.0–0.2)
Basos: 1 %
EOS (ABSOLUTE): 0.3 10*3/uL (ref 0.0–0.4)
Eos: 5 %
Hematocrit: 42.6 % (ref 37.5–51.0)
Hemoglobin: 13.8 g/dL (ref 13.0–17.7)
Immature Grans (Abs): 0 10*3/uL (ref 0.0–0.1)
Immature Granulocytes: 0 %
Lymphocytes Absolute: 2.4 10*3/uL (ref 0.7–3.1)
Lymphs: 36 %
MCH: 26.9 pg (ref 26.6–33.0)
MCHC: 32.4 g/dL (ref 31.5–35.7)
MCV: 83 fL (ref 79–97)
Monocytes Absolute: 0.6 10*3/uL (ref 0.1–0.9)
Monocytes: 8 %
Neutrophils Absolute: 3.3 10*3/uL (ref 1.4–7.0)
Neutrophils: 50 %
Platelets: 390 10*3/uL (ref 150–450)
RBC: 5.13 x10E6/uL (ref 4.14–5.80)
RDW: 14.5 % (ref 11.6–15.4)
WBC: 6.6 10*3/uL (ref 3.4–10.8)

## 2021-10-20 LAB — LIPID PANEL
Chol/HDL Ratio: 3.6 ratio (ref 0.0–5.0)
Cholesterol, Total: 184 mg/dL (ref 100–199)
HDL: 51 mg/dL (ref >39)
LDL Chol Calc (NIH): 117 mg/dL — ABNORMAL HIGH (ref 0–99)
Triglycerides: 86 mg/dL (ref 0–149)
VLDL Cholesterol Cal: 16 mg/dL (ref 5–40)

## 2021-10-20 LAB — MICROALBUMIN / CREATININE URINE RATIO
Creatinine, Urine: 58.7 mg/dL
Microalb/Creat Ratio: 279 mg/g creat — ABNORMAL HIGH (ref 0–29)
Microalbumin, Urine: 163.7 ug/mL

## 2021-10-22 NOTE — Addendum Note (Signed)
Addended by: Baruch Gouty on: 10/22/2021 09:17 PM   Modules accepted: Orders

## 2021-10-27 ENCOUNTER — Ambulatory Visit: Payer: Medicaid Other | Admitting: Podiatry

## 2021-11-03 ENCOUNTER — Other Ambulatory Visit: Payer: Self-pay

## 2021-11-03 ENCOUNTER — Ambulatory Visit (INDEPENDENT_AMBULATORY_CARE_PROVIDER_SITE_OTHER): Payer: Medicaid Other | Admitting: Podiatry

## 2021-11-03 DIAGNOSIS — E119 Type 2 diabetes mellitus without complications: Secondary | ICD-10-CM | POA: Diagnosis not present

## 2021-11-03 DIAGNOSIS — Z419 Encounter for procedure for purposes other than remedying health state, unspecified: Secondary | ICD-10-CM | POA: Diagnosis not present

## 2021-11-10 NOTE — Progress Notes (Signed)
Subjective: ?Randy Sanchez presents today referred by Randy Gouty, FNP for diabetic foot evaluation. ? ?Patient relates few year history of diabetes. ? ?Patient denies any history of foot wounds. ? ?Patient denies any history of numbness, tingling, burning, pins/needles sensations. ? ?Past Medical History:  ?Diagnosis Date  ? Diabetes mellitus type II, non insulin dependent ([place]) 07/28/2021  ? HTN, goal below 130/80 07/28/2021  ? Hyperlipidemia associated with type 2 diabetes mellitus ([place]), goal LDL < 70 07/28/2021  ? Ischemic cardiomyopathy 07/28/2021  ? NSTEMI (non-ST elevated myocardial infarction) ([employer]) 07/27/2021  ? ? ?Patient Active Problem List  ? Diagnosis Date Noted  ? 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 Coronary artery disease involving native coronary artery of native heart without angina pectoris 10/19/2021  ? Diabetes mellitus type II, non insulin dependent ([place]) 07/28/2021  ? Hyperlipidemia associated with type 2 diabetes mellitus ([place]), goal LDL < 70 07/28/2021  ? Hypertension associated with diabetes ([place]) 07/28/2021  ? Ischemic cardiomyopathy 07/28/2021  ? NSTEMI (non-ST elevated myocardial infarction) ([place]) 07/27/2021  ? ? ?Past Surgical History:  ?Procedure Laterality Date  ? CORONARY STENT INTERVENTION N/A 07/27/2021  ? Procedure: CORONARY STENT INTERVENTION;  Surgeon: Jettie Booze, MD;  Location: [place] [facility];  Service: Cardiovascular;  Laterality: N/A;  ? LEFT HEART CATH AND CORONARY ANGIOGRAPHY N/A 07/27/2021  ? Procedure: LEFT HEART CATH AND CORONARY ANGIOGRAPHY;  Surgeon: Jettie Booze, MD;  Location: [place] [facility];  Service: Cardiovascular;  Laterality: N/A;  ? ? ?Current Outpatient Medications on File Prior to Visit  ?Medication Sig Dispense Refill  ? aspirin EC 81 MG tablet Take 81 mg by mouth daily. Swallow whole.    ? atorvastatin (LIPITOR) 80 MG tablet Take 1 tablet (80 mg total) by mouth daily. 30 tablet 11  ? carvedilol (COREG) 6.25 MG tablet  Take 1 tablet (6.25 mg total) by mouth 2 (two) times daily with a meal. 60 tablet 6  ? empagliflozin (JARDIANCE) 10 MG TABS tablet Take 1 tablet (10 mg total) by mouth daily. 30 tablet 3  ? isosorbide mononitrate (IMDUR) 30 MG 24 hr tablet Take 1 tablet (30 mg total) by mouth daily. 30 tablet 3  ? nitroGLYCERIN (NITROSTAT) 0.4 MG SL tablet Place 1 tablet (0.4 mg total) under the tongue every 5 (five) minutes x 3 doses as needed for chest pain. 25 tablet 12  ? sacubitril-valsartan (ENTRESTO) 24-26 MG Take 1 tablet by mouth 2 (two) times daily. 60 tablet 3  ? ticagrelor (BRILINTA) 90 MG TABS tablet Take 1 tablet (90 mg total) by mouth 2 (two) times daily. 60 tablet 11  ? ?No current facility-administered medications on file prior to visit.  ?  ? ?No Known Allergies ? ?Social History  ? ?Occupational History  ? Not on file  ?Tobacco Use  ? Smoking status: Some Days  ?  Types: Cigarettes  ? Smokeless tobacco: Never  ?Vaping Use  ? Vaping Use: Never used  ?Substance and Sexual Activity  ? Alcohol use: Not Currently  ?  Alcohol/week: 2.0 standard drinks  ?  Types: 2 Shots of liquor per week  ?  Comment: 1-2 bottles of beer  ? Drug use: Not Currently  ?  Types: Cocaine, Marijuana  ?  Comment: last week  ? Sexual activity: Not on file  ? ? ?Family History  ?Problem Relation Age of Onset  ? Cancer Mother   ? Stroke Father   ? Stroke Sister   ? ? ?Immunization  History  ?Administered Date(s) Administered  ? Influenza-Unspecified 05/06/2021  ? Moderna Sars-Covid-2 Vaccination 12/31/2019, 01/05/2020, 08/25/2020  ? ? ?Review of systems: Positive Findings in bold print. ? ?Constitutional:  chills, fatigue, fever, sweats, weight change ?Communication: Optometrist, sign Ecologist, hand writing, iPad/Android device ?Head: headaches, head injury ?Eyes: changes in vision, eye pain, glaucoma, cataracts, macular degeneration, diplopia, glare,  light sensitivity, eyeglasses or contacts, blindness ?Ears nose mouth throat: hearing  impaired, hearing aids,  ringing in ears, deaf, sign language,  vertigo, nosebleeds,  rhinitis,  cold sores, snoring, swollen glands ?Cardiovascular: HTN, edema, arrhythmia, pacemaker in place, defibrillator in place, chest pain/tightness, chronic anticoagulation, blood clot, heart failure, MI ?Peripheral Vascular: leg cramps, varicose veins, blood clots, lymphedema, varicosities ?Respiratory:  asthma, difficulty breathing, denies congestion, SOB, wheezing, cough, emphysema ?Gastrointestinal: change in appetite or weight, abdominal pain, constipation, diarrhea, nausea, vomiting, vomiting blood, change in bowel habits, abdominal pain, jaundice, rectal bleeding, hemorrhoids, GERD ?Genitourinary:  nocturia,  pain on urination, polyuria,  blood in urine, Foley catheter, urinary urgency, ESRD on hemodialysis ?Musculoskeletal: amputation, cramping, stiff joints, painful joints, decreased joint motion, fractures, OA, gout, hemiplegia, paraplegia, uses cane, wheelchair bound, uses walker, uses rollator ?Skin: +changes in toenails, color change, dryness, itching, mole changes,  rash, wound(s) ?Neurological: headaches, numbness in feet, paresthesias in feet, burning in feet, fainting,  seizures, change in speech, migraines, memory problems/poor historian, cerebral palsy, weakness, paralysis, CVA, TIA ?Endocrine: diabetes, hypothyroidism, hyperthyroidism,  goiter, dry mouth, flushing, heat intolerance, cold intolerance,  excessive thirst, denies polyuria,  nocturia ?Hematological:  easy bleeding, excessive bleeding, easy bruising, enlarged lymph nodes, on long term blood thinner, history of past transusions ?Allergy/immunological:  hives, eczema, frequent infections, multiple drug allergies, seasonal allergies, transplant recipient, multiple food allergies ?Psychiatric:  anxiety, depression, mood disorder, suicidal ideations, hallucinations, insomnia ? ?Objective: ?There were no vitals filed for this visit. ?Vascular  Examination: ?Capillary refill time less than 3 seconds x 10 digits. ? ?Dorsalis pedis pulses palpable 2 out of 4. ? ?Posterior tibial pulses palpable 2 out of 4  ? ?Digital hair not present x 10 digits. ? ?Skin temperature gradient WNL b/l. ? ?Dermatological Examination: ?Skin with normal turgor, texture and tone b/l ? ?Toenails 1-5 b/l discolored, thick, dystrophic with subungual debris and pain with palpation to nailbeds due to thickness of nails. ? ?Musculoskeletal: ?Muscle strength 5/5 to all LE muscle groups. ? ?Neurological: ?Sensation intact with 10 gram monofilament. ? ?Vibratory sensation intact. ? ?Assessment: ?NIDDM ?Encounter for diabetic foot examination ? ?Plan: ?Discussed diabetic foot care principles. Literature dispensed on today. ?Patient to continue soft, supportive shoe gear daily. ?Patient to report any pedal injuries to medical professional immediately. ?Follow up one year. ?Patient/POA to call should there be a concern in the interim. ? ?

## 2021-11-15 ENCOUNTER — Ambulatory Visit (HOSPITAL_COMMUNITY)
Admission: RE | Admit: 2021-11-15 | Discharge: 2021-11-15 | Disposition: A | Discharge status: Home / Self Care | Payer: Medicaid Other | Source: Ambulatory Visit | Attending: Family | Admitting: Family

## 2021-11-15 DIAGNOSIS — I255 Ischemic cardiomyopathy: Secondary | ICD-10-CM | POA: Diagnosis not present

## 2021-11-15 DIAGNOSIS — I5022 Chronic systolic (congestive) heart failure: Secondary | ICD-10-CM | POA: Diagnosis not present

## 2021-11-15 LAB — ECHOCARDIOGRAM COMPLETE
Area-P 1/2: 3.31 cm2
S' Lateral: 3.4 cm

## 2021-11-15 NOTE — Progress Notes (Signed)
*  PRELIMINARY RESULTS* ?Echocardiogram ?2D Echocardiogram has been performed. ? ?Randy Sanchez ?11/15/2021, 9:19 AM ?

## 2021-11-16 ENCOUNTER — Ambulatory Visit (INDEPENDENT_AMBULATORY_CARE_PROVIDER_SITE_OTHER): Payer: Medicaid Other | Admitting: Family Medicine

## 2021-11-16 ENCOUNTER — Other Ambulatory Visit (HOSPITAL_BASED_OUTPATIENT_CLINIC_OR_DEPARTMENT_OTHER): Payer: Self-pay | Admitting: Family

## 2021-11-16 ENCOUNTER — Encounter: Payer: Self-pay | Admitting: Family Medicine

## 2021-11-16 ENCOUNTER — Telehealth (HOSPITAL_BASED_OUTPATIENT_CLINIC_OR_DEPARTMENT_OTHER): Payer: Self-pay

## 2021-11-16 VITALS — BP 129/81 | HR 89 | Temp 98.9°F | Ht 67.0 in | Wt 151.0 lb

## 2021-11-16 DIAGNOSIS — I152 Hypertension secondary to endocrine disorders: Secondary | ICD-10-CM

## 2021-11-16 DIAGNOSIS — Z1212 Encounter for screening for malignant neoplasm of rectum: Secondary | ICD-10-CM | POA: Diagnosis not present

## 2021-11-16 DIAGNOSIS — I25118 Atherosclerotic heart disease of native coronary artery with other forms of angina pectoris: Secondary | ICD-10-CM

## 2021-11-16 DIAGNOSIS — E1169 Type 2 diabetes mellitus with other specified complication: Secondary | ICD-10-CM | POA: Diagnosis not present

## 2021-11-16 DIAGNOSIS — E785 Hyperlipidemia, unspecified: Secondary | ICD-10-CM

## 2021-11-16 DIAGNOSIS — Z1211 Encounter for screening for malignant neoplasm of colon: Secondary | ICD-10-CM

## 2021-11-16 DIAGNOSIS — Z0001 Encounter for general adult medical examination with abnormal findings: Secondary | ICD-10-CM

## 2021-11-16 DIAGNOSIS — R195 Other fecal abnormalities: Secondary | ICD-10-CM

## 2021-11-16 DIAGNOSIS — R0683 Snoring: Secondary | ICD-10-CM | POA: Diagnosis not present

## 2021-11-16 DIAGNOSIS — E1159 Type 2 diabetes mellitus with other circulatory complications: Secondary | ICD-10-CM

## 2021-11-16 DIAGNOSIS — R809 Proteinuria, unspecified: Secondary | ICD-10-CM | POA: Diagnosis not present

## 2021-11-16 DIAGNOSIS — Z0000 Encounter for general adult medical examination without abnormal findings: Secondary | ICD-10-CM | POA: Diagnosis not present

## 2021-11-16 DIAGNOSIS — Z5320 Procedure and treatment not carried out because of patient's decision for unspecified reasons: Secondary | ICD-10-CM | POA: Diagnosis not present

## 2021-11-16 LAB — BAYER DCA HB A1C WAIVED: HB A1C (BAYER DCA - WAIVED): 6.6 % — ABNORMAL HIGH (ref 4.8–5.6)

## 2021-11-16 MED ORDER — SPIRONOLACTONE 25 MG PO TABS: 12.5000 mg | ORAL_TABLET | Freq: Every day | ORAL | 3 refills | Status: DC

## 2021-11-16 MED ORDER — EZETIMIBE 10 MG PO TABS: 10.0000 mg | ORAL_TABLET | Freq: Every day | ORAL | 3 refills | Status: DC

## 2021-11-16 NOTE — Telephone Encounter (Addendum)
Spoke with patient and his sister, results reviewed, labs ordered and mailed to sisters address and prescriptions sent to preferred pharmacy.  ? ? ?----- Message from Loel Dubonnet, NP sent at 11/15/2021  5:09 PM EDT ----- ?Echo shows reduced heart pumping function stable compared to previous. Wall motion abnormalities stable compared to previous. Mild leaking of mitral valve. Trivial pericardial effusion.  ? ?For optimization of medical therapy, add Spironolactone 12.'5mg'$  QD.BMP in 2 weeks. ? ?Recent labs show LDL of 117 at PCP, above goal of <70 - please ensure taking Atorvastatin '80mg'$  QD. If taking, add Zetia '10mg'$  QD. ?

## 2021-11-16 NOTE — Progress Notes (Signed)
?  ? ?Subjective:  ?Patient ID: Randy Sanchez, male    DOB: 1955-11-10, 66 y.o.   MRN: [id number] ? ?Patient Care Team: ?Baruch Gouty, FNP as PCP - General (Family Medicine) ?Skeet Latch, MD as PCP - Cardiology (Cardiology)  ? ?Chief Complaint:  Annual Exam ? ? ?HPI: ?[name] is a 66 y.o. male presenting on 11/16/2021 for Annual Exam ? ? ?Pt presents today for annual physical exam. States he is doing ok overall. He has followed up with cardiology but has not followed up with nephrology or opthalmology as referred. His only concern today is decreased libido. Pt educated on disease processes and medication side effects. He also reports loud snoring and apneic spells during sleep. He does take medications without significant adverse side effects. No chest pain, palpitations, orthopnea, PND, leg swelling, shortness of breath, fatigue, headaches, polyuria, polyphagia, or polydipsia. He is due to colonoscopy and agrees to cologuard, denies personal or first degree relative history of CRC. Vaccinations need to be updated, pt will check with pharmacy for pricing and complete if affordable.  ? ? ?Relevant past medical, surgical, family, and social history reviewed and updated as indicated.  ?Allergies and medications reviewed and updated. Data reviewed: Chart in Epic. ? ? ?Past Medical History:  ?Diagnosis Date  ? Diabetes mellitus type II, non insulin dependent ([place]) 07/28/2021  ? HTN, goal below 130/80 07/28/2021  ? Hyperlipidemia associated with type 2 diabetes mellitus ([place]), goal LDL < 70 07/28/2021  ? Ischemic cardiomyopathy 07/28/2021  ? NSTEMI (non-ST elevated myocardial infarction) ([place]) 07/27/2021  ? ? ?Past Surgical History:  ?Procedure Laterality Date  ? CORONARY STENT INTERVENTION N/A 07/27/2021  ? Procedure: CORONARY STENT INTERVENTION;  Surgeon: Jettie Booze, MD;  Location: [facility];  Service: Cardiovascular;  Laterality: N/A;  ? LEFT HEART CATH AND CORONARY ANGIOGRAPHY N/A  07/27/2021  ? Procedure: LEFT HEART CATH AND CORONARY ANGIOGRAPHY;  Surgeon: Jettie Booze, MD;  Location: [facility];  Service: Cardiovascular;  Laterality: N/A;  ? ? ?Social History  ? ?Socioeconomic History  ? Marital status: Widowed  ?  Spouse name: Not on file  ? Number of children: Not on file  ? Years of education: Not on file  ? Highest education level: Not on file  ?Occupational History  ? Not on file  ?Tobacco Use  ? Smoking status: Some Days  ?  Types: Cigarettes  ? Smokeless tobacco: Never  ?Vaping Use  ? Vaping Use: Never used  ?Substance and Sexual Activity  ? Alcohol use: Not Currently  ?  Alcohol/week: 2.0 standard drinks  ?  Types: 2 Shots of liquor per week  ?  Comment: 1-2 bottles of beer  ? Drug use: Not Currently  ?  Types: Cocaine, Marijuana  ?  Comment: last week  ? Sexual activity: Not on file  ?Other Topics Concern  ? Not on file  ?Social History Narrative  ? Not on file  ? ?Social Determinants of Health  ? ?Financial Resource Strain: Not on file  ?Food Insecurity: Not on file  ?Transportation Needs: Not on file  ?Physical Activity: Not on file  ?Stress: Not on file  ?Social Connections: Not on file  ?Intimate Partner Violence: Not on file  ? ? ?Outpatient Encounter Medications as of 11/16/2021  ?Medication Sig  ? aspirin EC 81 MG tablet Take 81 mg by mouth daily. Swallow whole.  ? atorvastatin (LIPITOR) 80 MG tablet Take 1 tablet (80 mg total) by mouth daily.  ?  carvedilol (COREG) 6.25 MG tablet Take 1 tablet (6.25 mg total) by mouth 2 (two) times daily with a meal.  ? empagliflozin (JARDIANCE) 10 MG TABS tablet Take 1 tablet (10 mg total) by mouth daily.  ? isosorbide mononitrate (IMDUR) 30 MG 24 hr tablet Take 1 tablet (30 mg total) by mouth daily.  ? nitroGLYCERIN (NITROSTAT) 0.4 MG SL tablet Place 1 tablet (0.4 mg total) under the tongue every 5 (five) minutes x 3 doses as needed for chest pain.  ? sacubitril-valsartan (ENTRESTO) 24-26 MG Take 1 tablet by mouth 2 (two)  times daily.  ? ticagrelor (BRILINTA) 90 MG TABS tablet Take 1 tablet (90 mg total) by mouth 2 (two) times daily.  ? ?No facility-administered encounter medications on file as of 11/16/2021.  ? ? ?No Known Allergies ? ?Review of Systems  ?Constitutional:  Negative for activity change, appetite change, chills, diaphoresis, fatigue, fever and unexpected weight change.  ?HENT: Negative.    ?Eyes: Negative.  Negative for photophobia and visual disturbance.  ?Respiratory:  Negative for cough, chest tightness and shortness of breath.   ?Cardiovascular:  Negative for chest pain, palpitations and leg swelling.  ?Gastrointestinal:  Negative for abdominal pain, blood in stool, constipation, diarrhea, nausea and vomiting.  ?Endocrine: Negative.  Negative for cold intolerance, heat intolerance, polydipsia, polyphagia and polyuria.  ?Genitourinary:  Negative for decreased urine volume, difficulty urinating, dysuria, frequency and urgency.  ?     Decreased libido  ?Musculoskeletal:  Negative for arthralgias and myalgias.  ?Skin: Negative.   ?Allergic/Immunologic: Negative.   ?Neurological:  Negative for dizziness, tremors, seizures, syncope, facial asymmetry, speech difficulty, weakness, light-headedness, numbness and headaches.  ?Hematological: Negative.   ?Psychiatric/Behavioral:  Negative for confusion, hallucinations, sleep disturbance and suicidal ideas.   ?All other systems reviewed and are negative. ? ?   ? ?Objective:  ?BP 129/81   Pulse 89   Temp 98.9 ?F (37.2 ?C)   Ht '5\' 7"'$  (1.702 m)   Wt 151 lb (68.5 kg)   SpO2 99%   BMI 23.65 kg/m?   ? ?Wt Readings from Last 3 Encounters:  ?11/16/21 151 lb (68.5 kg)  ?10/19/21 148 lb (67.1 kg)  ?08/16/21 155 lb (70.3 kg)  ? ? ?Physical Exam ?Vitals and nursing note reviewed.  ?Constitutional:   ?   General: He is not in acute distress. ?   Appearance: Normal appearance. He is well-developed, well-groomed and normal weight. He is not ill-appearing, toxic-appearing or diaphoretic.   ?HENT:  ?   Head: Normocephalic and atraumatic.  ?   Jaw: There is normal jaw occlusion.  ?   Right Ear: Hearing normal.  ?   Left Ear: Hearing normal.  ?   Nose: Nose normal.  ?   Mouth/Throat:  ?   Lips: Pink.  ?   Mouth: Mucous membranes are moist.  ?   Pharynx: Oropharynx is clear. Uvula midline.  ?Eyes:  ?   General: Lids are normal.  ?   Extraocular Movements: Extraocular movements intact.  ?   Conjunctiva/sclera: Conjunctivae normal.  ?   Pupils: Pupils are equal, round, and reactive to light.  ?Neck:  ?   Thyroid: No thyroid mass, thyromegaly or thyroid tenderness.  ?   Vascular: No carotid bruit or JVD.  ?   Trachea: Trachea and phonation normal.  ?Cardiovascular:  ?   Rate and Rhythm: Normal rate and regular rhythm.  ?   Chest Wall: PMI is not displaced.  ?   Pulses: Normal pulses.  ?  Heart sounds: Normal heart sounds. No murmur heard. ?  No friction rub. No gallop.  ?Pulmonary:  ?   Effort: Pulmonary effort is normal. No respiratory distress.  ?   Breath sounds: Normal breath sounds. No wheezing.  ?Abdominal:  ?   General: Bowel sounds are normal. There is no distension or abdominal bruit.  ?   Palpations: Abdomen is soft. There is no hepatomegaly or splenomegaly.  ?   Tenderness: There is no abdominal tenderness. There is no right CVA tenderness or left CVA tenderness.  ?   Hernia: No hernia is present.  ?Musculoskeletal:     ?   General: Normal range of motion.  ?   Cervical back: Normal range of motion and neck supple.  ?   Right lower leg: No edema.  ?   Left lower leg: No edema.  ?Lymphadenopathy:  ?   Cervical: No cervical adenopathy.  ?Skin: ?   General: Skin is warm and dry.  ?   Capillary Refill: Capillary refill takes less than 2 seconds.  ?   Coloration: Skin is not cyanotic, jaundiced or pale.  ?   Findings: No rash.  ?Neurological:  ?   General: No focal deficit present.  ?   Mental Status: He is alert and oriented to person, place, and time.  ?   Sensory: Sensation is intact.  ?   Motor:  Motor function is intact.  ?   Coordination: Coordination is intact.  ?   Gait: Gait is intact.  ?   Deep Tendon Reflexes: Reflexes are normal and symmetric.  ?Psychiatric:     ?   Attention and Perception: Attenti

## 2021-11-16 NOTE — Addendum Note (Signed)
Addended by: Gerald Stabs on: 11/16/2021 11:01 AM ? ? Modules accepted: Orders ? ?

## 2021-11-17 ENCOUNTER — Telehealth: Payer: Self-pay

## 2021-11-17 DIAGNOSIS — E1169 Type 2 diabetes mellitus with other specified complication: Secondary | ICD-10-CM

## 2021-11-17 DIAGNOSIS — R6889 Other general symptoms and signs: Secondary | ICD-10-CM

## 2021-11-17 LAB — THYROID PANEL WITH TSH
Free Thyroxine Index: 2.2 (ref 1.2–4.9)
T3 Uptake Ratio: 28 % (ref 24–39)
T4, Total: 7.9 ug/dL (ref 4.5–12.0)
TSH: 3.15 u[IU]/mL (ref 0.450–4.500)

## 2021-11-17 LAB — CBC WITH DIFFERENTIAL/PLATELET
Basophils Absolute: 0.1 10*3/uL (ref 0.0–0.2)
Basos: 1 %
EOS (ABSOLUTE): 0.3 10*3/uL (ref 0.0–0.4)
Eos: 5 %
Hematocrit: 45.6 % (ref 37.5–51.0)
Hemoglobin: 15 g/dL (ref 13.0–17.7)
Immature Grans (Abs): 0 10*3/uL (ref 0.0–0.1)
Immature Granulocytes: 0 %
Lymphocytes Absolute: 2 10*3/uL (ref 0.7–3.1)
Lymphs: 31 %
MCH: 27.1 pg (ref 26.6–33.0)
MCHC: 32.9 g/dL (ref 31.5–35.7)
MCV: 82 fL (ref 79–97)
Monocytes Absolute: 0.6 10*3/uL (ref 0.1–0.9)
Monocytes: 9 %
Neutrophils Absolute: 3.4 10*3/uL (ref 1.4–7.0)
Neutrophils: 54 %
Platelets: 421 10*3/uL (ref 150–450)
RBC: 5.54 x10E6/uL (ref 4.14–5.80)
RDW: 14 % (ref 11.6–15.4)
WBC: 6.5 10*3/uL (ref 3.4–10.8)

## 2021-11-17 LAB — MICROALBUMIN / CREATININE URINE RATIO
Creatinine, Urine: 58.1 mg/dL
Microalb/Creat Ratio: 295 mg/g creat — ABNORMAL HIGH (ref 0–29)
Microalbumin, Urine: 171.5 ug/mL

## 2021-11-17 LAB — CMP14+EGFR
ALT: 21 IU/L (ref 0–44)
AST: 20 IU/L (ref 0–40)
Albumin/Globulin Ratio: 1.5 (ref 1.2–2.2)
Albumin: 4.5 g/dL (ref 3.8–4.8)
Alkaline Phosphatase: 100 IU/L (ref 44–121)
BUN/Creatinine Ratio: 6 — ABNORMAL LOW (ref 10–24)
BUN: 7 mg/dL — ABNORMAL LOW (ref 8–27)
Bilirubin Total: 0.5 mg/dL (ref 0.0–1.2)
CO2: 24 mmol/L (ref 20–29)
Calcium: 9.8 mg/dL (ref 8.6–10.2)
Chloride: 102 mmol/L (ref 96–106)
Creatinine, Ser: 1.1 mg/dL (ref 0.76–1.27)
Globulin, Total: 3.1 g/dL (ref 1.5–4.5)
Glucose: 147 mg/dL — ABNORMAL HIGH (ref 70–99)
Potassium: 4.2 mmol/L (ref 3.5–5.2)
Sodium: 140 mmol/L (ref 134–144)
Total Protein: 7.6 g/dL (ref 6.0–8.5)
eGFR: 75 mL/min/{1.73_m2} (ref >59)

## 2021-11-17 LAB — LIPID PANEL
Chol/HDL Ratio: 3.2 ratio (ref 0.0–5.0)
Cholesterol, Total: 168 mg/dL (ref 100–199)
HDL: 52 mg/dL (ref >39)
LDL Chol Calc (NIH): 99 mg/dL (ref 0–99)
Triglycerides: 90 mg/dL (ref 0–149)
VLDL Cholesterol Cal: 17 mg/dL (ref 5–40)

## 2021-11-17 LAB — VITAMIN D 25 HYDROXY (VIT D DEFICIENCY, FRACTURES): Vit D, 25-Hydroxy: 19.2 ng/mL — ABNORMAL LOW (ref 30.0–100.0)

## 2021-11-17 LAB — PSA, TOTAL AND FREE
PSA, Free Pct: 22.2 %
PSA, Free: 0.2 ng/mL
Prostate Specific Ag, Serum: 0.9 ng/mL (ref 0.0–4.0)

## 2021-11-17 LAB — HEPATITIS C ANTIBODY: Hep C Virus Ab: NONREACTIVE

## 2021-11-17 NOTE — Telephone Encounter (Signed)
Patient's sister is calling to check on referral to [facility] in [place].  I do not see a referral there.  Please advise. ?

## 2021-11-17 NOTE — Telephone Encounter (Signed)
Pt told me he already sees Dr. Katy Fitch ?

## 2021-11-17 NOTE — Telephone Encounter (Signed)
Spoke with sister, she said he is a patient of [facility] here in [place] and they had recommended he be seen at Dr. Zenia Resides office, so he will need a referral. ?

## 2021-11-17 NOTE — Addendum Note (Signed)
Addended by: Baruch Gouty on: 11/17/2021 02:58 PM ? ? Modules accepted: Orders ? ?

## 2021-11-18 NOTE — Telephone Encounter (Signed)
Patient's sister aware.

## 2021-11-25 ENCOUNTER — Other Ambulatory Visit: Payer: Self-pay | Admitting: Family

## 2021-11-25 DIAGNOSIS — I25118 Atherosclerotic heart disease of native coronary artery with other forms of angina pectoris: Secondary | ICD-10-CM | POA: Diagnosis not present

## 2021-11-25 DIAGNOSIS — E785 Hyperlipidemia, unspecified: Secondary | ICD-10-CM | POA: Diagnosis not present

## 2021-11-26 ENCOUNTER — Telehealth (HOSPITAL_BASED_OUTPATIENT_CLINIC_OR_DEPARTMENT_OTHER): Payer: Self-pay

## 2021-11-26 LAB — BASIC METABOLIC PANEL WITH GFR
BUN/Creatinine Ratio: 13 (ref 10–24)
BUN: 17 mg/dL (ref 8–27)
CO2: 20 mmol/L (ref 20–29)
Calcium: 9 mg/dL (ref 8.6–10.2)
Chloride: 110 mmol/L — ABNORMAL HIGH (ref 96–106)
Creatinine, Ser: 1.26 mg/dL (ref 0.76–1.27)
Glucose: 155 mg/dL — ABNORMAL HIGH (ref 70–99)
Potassium: 4.1 mmol/L (ref 3.5–5.2)
Sodium: 146 mmol/L — ABNORMAL HIGH (ref 134–144)
eGFR: 64 mL/min/{1.73_m2}

## 2021-11-26 LAB — LIPID PANEL
Chol/HDL Ratio: 2.5 ratio (ref 0.0–5.0)
Cholesterol, Total: 135 mg/dL (ref 100–199)
HDL: 54 mg/dL
LDL Chol Calc (NIH): 59 mg/dL (ref 0–99)
Triglycerides: 123 mg/dL (ref 0–149)
VLDL Cholesterol Cal: 22 mg/dL (ref 5–40)

## 2021-11-26 NOTE — Telephone Encounter (Addendum)
Results called to patient who verbalizes understanding!  ? ? ? ? ?----- Message from Loel Dubonnet, NP sent at 11/26/2021  7:58 AM EDT ----- ?Sodium mildly elevated. Ensure following low sodium diet. Normal kidney function. Cholesterol panel at goal.  ?

## 2021-11-30 DIAGNOSIS — Z1211 Encounter for screening for malignant neoplasm of colon: Secondary | ICD-10-CM | POA: Diagnosis not present

## 2021-12-04 DIAGNOSIS — Z419 Encounter for procedure for purposes other than remedying health state, unspecified: Secondary | ICD-10-CM | POA: Diagnosis not present

## 2021-12-09 ENCOUNTER — Telehealth: Payer: Self-pay | Admitting: Family Medicine

## 2021-12-09 ENCOUNTER — Encounter: Payer: Self-pay | Admitting: Gastroenterology

## 2021-12-09 LAB — COLOGUARD: COLOGUARD: POSITIVE — AB

## 2021-12-09 NOTE — Addendum Note (Signed)
Addended by: Baruch Gouty on: 12/09/2021 07:51 AM ? ? Modules accepted: Orders ? ?

## 2021-12-09 NOTE — Telephone Encounter (Signed)
Patient's sister calling about cologuard, need clarification. Aware that a referral was placed. Please call back.  ?

## 2021-12-20 ENCOUNTER — Encounter (HOSPITAL_BASED_OUTPATIENT_CLINIC_OR_DEPARTMENT_OTHER): Payer: Self-pay | Admitting: Cardiovascular Disease

## 2021-12-20 ENCOUNTER — Ambulatory Visit (INDEPENDENT_AMBULATORY_CARE_PROVIDER_SITE_OTHER): Payer: Medicaid Other | Admitting: Cardiovascular Disease

## 2021-12-20 VITALS — BP 154/86 | HR 92 | Ht 67.0 in | Wt 153.1 lb

## 2021-12-20 DIAGNOSIS — E785 Hyperlipidemia, unspecified: Secondary | ICD-10-CM | POA: Diagnosis not present

## 2021-12-20 DIAGNOSIS — I152 Hypertension secondary to endocrine disorders: Secondary | ICD-10-CM

## 2021-12-20 DIAGNOSIS — I214 Non-ST elevation (NSTEMI) myocardial infarction: Secondary | ICD-10-CM | POA: Diagnosis not present

## 2021-12-20 DIAGNOSIS — I255 Ischemic cardiomyopathy: Secondary | ICD-10-CM

## 2021-12-20 DIAGNOSIS — Z5181 Encounter for therapeutic drug level monitoring: Secondary | ICD-10-CM | POA: Diagnosis not present

## 2021-12-20 DIAGNOSIS — E1159 Type 2 diabetes mellitus with other circulatory complications: Secondary | ICD-10-CM | POA: Diagnosis not present

## 2021-12-20 DIAGNOSIS — E1169 Type 2 diabetes mellitus with other specified complication: Secondary | ICD-10-CM | POA: Diagnosis not present

## 2021-12-20 DIAGNOSIS — Z720 Tobacco use: Secondary | ICD-10-CM | POA: Diagnosis not present

## 2021-12-20 HISTORY — DX: Tobacco use: Z72.0

## 2021-12-20 MED ORDER — BLOOD PRESSURE KIT: PACK | 0 refills | Status: AC

## 2021-12-20 MED ORDER — SACUBITRIL-VALSARTAN 49-51 MG PO TABS: 1.0000 | ORAL_TABLET | Freq: Two times a day (BID) | ORAL | 5 refills | Status: DC

## 2021-12-20 NOTE — Assessment & Plan Note (Signed)
He was congratulated on smoking cessation. ?

## 2021-12-20 NOTE — Assessment & Plan Note (Signed)
He is doing well from a cardiac standpoint and has no chest pain or dyspnea.  Continue aspirin, carvedilol, atorvastatin, and Imdur.  Continue ticagrelor through 07/2022. ?

## 2021-12-20 NOTE — Progress Notes (Signed)
?Cardiology Office Note:   ? ?Date:  12/20/2021  ? ?ID:  Randy Sanchez, DOB [birth year], MRN [id number] ? ?PCP:  Baruch Gouty, FNP ?  ?[facility] Providers ?Cardiologist:  Skeet Latch, MD    ? ?Referring MD: No ref. provider found  ? ?No chief complaint on file. ? ? ?History of Present Illness:   ? ?Randy Sanchez is a 66 y.o. male with a hx of NSTEMI, chronic systolic and diastolic heart failure, coronary disease s/p DES to Cx 07/2021, hypertension, hyperlipidemia, tobacco use, and type 2 diabetes mellitus, here for follow-up. On 07/25/2021 he presented to the ED with chest pain. Peak troponin was greater than 24,000. A cardiac cath showed multivessel disease, culprit lesion was found to be OM1 treated with PTCA, and 80% circumflex stenosis was treated with DES. Echo showed LVEF 40-45%. Noted to not be on any medications since 2018. Started on Jardiance, Entresto, carvedilol, aspirin, atorvastatin, Imdur, and beta-blocker. He was seen by Laurann Montana, NP on 08/16/21. He reported mild DOE after a 5 mile walk. At that visit his EKG showed NSR at 85 bpm with TWI in lateral leads, improved compared to previous. He was encouraged to participate in cardiac rehab. He had a repeat Echo 11/2021 with LVEF 40-45%. ? ?He is accompanied by his sister. Today, he is feeling alright aside from constant swelling in his left elbow, associated with pain at night. This has been ongoing for 4-5 months, with a sudden onset initially. He denies any known physical injury. He does not usually check his BP at home. Normally he takes his AM medications when he wakes up early. While he walks, he feels like he is moving slowly. However, he does not feel limited in his activity. For exercise, he is completing yard work daily. He continues to follow a healthy diet, and tries to avoid fried foods. Currently he is doing well avoiding smoking. He denies any palpitations, chest pain, shortness of breath, or peripheral edema. No  lightheadedness, headaches, syncope, orthopnea, or PND. ? ? ?Past Medical History:  ?Diagnosis Date  ? Diabetes mellitus type II, non insulin dependent ([place]) 07/28/2021  ? HTN, goal below 130/80 07/28/2021  ? Hyperlipidemia associated with type 2 diabetes mellitus ([place]), goal LDL < 70 07/28/2021  ? Ischemic cardiomyopathy 07/28/2021  ? NSTEMI (non-ST elevated myocardial infarction) ([place]) 07/27/2021  ? Tobacco abuse 12/20/2021  ? ? ?Past Surgical History:  ?Procedure Laterality Date  ? CORONARY STENT INTERVENTION N/A 07/27/2021  ? Procedure: CORONARY STENT INTERVENTION;  Surgeon: Jettie Booze, MD;  Location: [facility];  Service: Cardiovascular;  Laterality: N/A;  ? LEFT HEART CATH AND CORONARY ANGIOGRAPHY N/A 07/27/2021  ? Procedure: LEFT HEART CATH AND CORONARY ANGIOGRAPHY;  Surgeon: Jettie Booze, MD;  Location: [facility];  Service: Cardiovascular;  Laterality: N/A;  ? ? ?Current Medications: ?Current Meds  ?Medication Sig  ? aspirin EC 81 MG tablet Take 81 mg by mouth daily. Swallow whole.  ? atorvastatin (LIPITOR) 80 MG tablet Take 1 tablet (80 mg total) by mouth daily.  ? Blood Pressure KIT CHECK YOUR BLOOD PRESSURE TWICE A DAY  ? carvedilol (COREG) 6.25 MG tablet Take 1 tablet (6.25 mg total) by mouth 2 (two) times daily with a meal.  ? ezetimibe (ZETIA) 10 MG tablet Take 1 tablet (10 mg total) by mouth daily.  ? isosorbide mononitrate (IMDUR) 30 MG 24 hr tablet TAKE ONE (1) TABLET BY MOUTH EVERY DAY  ? JARDIANCE 10 MG TABS tablet  TAKE ONE (1) TABLET BY MOUTH EVERY DAY  ? nitroGLYCERIN (NITROSTAT) 0.4 MG SL tablet Place 1 tablet (0.4 mg total) under the tongue every 5 (five) minutes x 3 doses as needed for chest pain.  ? sacubitril-valsartan (ENTRESTO) 49-51 MG Take 1 tablet by mouth 2 (two) times daily.  ? spironolactone (ALDACTONE) 25 MG tablet Take 0.5 tablets (12.5 mg total) by mouth daily.  ? ticagrelor (BRILINTA) 90 MG TABS tablet Take 1 tablet (90 mg total) by mouth 2  (two) times daily.  ? [DISCONTINUED] sacubitril-valsartan (ENTRESTO) 24-26 MG Take 1 tablet by mouth 2 (two) times daily.  ?  ? ?Allergies:   Patient has no known allergies.  ? ?Social History  ? ?Socioeconomic History  ? Marital status: Widowed  ?  Spouse name: Not on file  ? Number of children: Not on file  ? Years of education: Not on file  ? Highest education level: Not on file  ?Occupational History  ? Not on file  ?Tobacco Use  ? Smoking status: Some Days  ?  Types: Cigarettes  ? Smokeless tobacco: Never  ?Vaping Use  ? Vaping Use: Never used  ?Substance and Sexual Activity  ? Alcohol use: Not Currently  ?  Alcohol/week: 2.0 standard drinks  ?  Types: 2 Shots of liquor per week  ?  Comment: 1-2 bottles of beer  ? Drug use: Not Currently  ?  Types: Cocaine, Marijuana  ?  Comment: last week  ? Sexual activity: Not on file  ?Other Topics Concern  ? Not on file  ?Social History Narrative  ? Not on file  ? ?Social Determinants of Health  ? ?Financial Resource Strain: Not on file  ?Food Insecurity: Not on file  ?Transportation Needs: Not on file  ?Physical Activity: Not on file  ?Stress: Not on file  ?Social Connections: Not on file  ?  ? ?Family History: ?The patient's family history includes Cancer in his mother; Stroke in his father and sister. ? ?ROS:   ?Please see the history of present illness.    ?(+) Left elbow edema, nocturnal pain ?All other systems reviewed and are negative. ? ?EKGs/Labs/Other Studies Reviewed:   ? ?The following studies were reviewed today: ? ?Echo 11/15/2021: ? 1. Left ventricular ejection fraction, by estimation, is 40 to 45%. The  ?left ventricle has mildly decreased function. The left ventricle  ?demonstrates regional wall motion abnormalities (see scoring  ?diagram/findings for description). The mid-to-distal  ?anterolateral and inferolateral LV segments are hypokinetic. There is mild  ?concentric left ventricular hypertrophy. Left ventricular diastolic  ?parameters are consistent  with Grade I diastolic dysfunction (impaired  ?relaxation).  ? 2. Right ventricular systolic function is normal. The right ventricular  ?size is normal. Tricuspid regurgitation signal is inadequate for assessing  ?PA pressure.  ? 3. The pericardial effusion is posterior to the left ventricle.  ? 4. The mitral valve is normal in structure. Mild mitral valve  ?regurgitation.  ? 5. The aortic valve is tricuspid. There is mild calcification of the  ?aortic valve. There is mild thickening of the aortic valve. Aortic valve  ?regurgitation is not visualized. Aortic valve sclerosis/calcification is  ?present, without any evidence of  ?aortic stenosis.  ? 6. The inferior vena cava is normal in size with greater than 50%  ?respiratory variability, suggesting right atrial pressure of 3 mmHg.  ? ?Comparison(s): Compared to prior study on 07/2021, there is no significant  ?change.  ? ?Left Heart Cath 07/27/2021: ?  1st  Mrg-1 lesion is 100% stenosed.  Releatively small vessel. Culprit for MI.  Balloon angioplasty was performed using a BALLN SAPPHIRE 2.0X15. ?  Post intervention, there is a 25% residual stenosis. ?  1st Mrg-2 lesion is 75% stenosed.  Balloon angioplasty was performed using a BALLN SAPPHIRE 2.0X15. ?  Post intervention, there is a 25% residual stenosis. ?  Ost LAD to Prox LAD lesion is 25% stenosed. ?  Mid Cx lesion is 80% stenosed. ?  A drug-eluting stent was successfully placed using a STENT ONYX FRONTIER 3.0X15, postdilated to 3.25 mm. ?  Post intervention, there is a 0% residual stenosis. ?  2nd Diag lesion is 80% stenosed. ?  RPDA-2 lesion is 50% stenosed. ?  RPDA-1 lesion is 50% stenosed. ?  Prox RCA lesion is 25% stenosed. ?  There is mild to moderate left ventricular systolic dysfunction. ?  LV end diastolic pressure is low. ?  The left ventricular ejection fraction is 35-45% by visual estimate. ?  There is no aortic valve stenosis. ?  ?I stressed the importance of dual antiplatelet therapy.  He will need  aggressive secondary prevention including avoidance of any illicit substances, or tobacco.  He will need aggressive blood pressure control.  High-dose statin. ? ?Diagnostic ?Dominance: Right ?Intervention ? ? ? ?EKG:

## 2021-12-20 NOTE — Assessment & Plan Note (Signed)
Lipids are well-controlled on atorvastatin. 

## 2021-12-20 NOTE — Assessment & Plan Note (Signed)
Blood pressure was not controlled today either initially or on repeat.  Continue carvedilol and increase Entresto to 49/51 mg.  Check a basic metabolic panel in a week.  Continue Imdur. ?

## 2021-12-20 NOTE — Assessment & Plan Note (Signed)
LVEF 40 to 45%.  He is euvolemic and doing well.  Continue carvedilol, spironolactone, Imdur, and increase Entresto to 49/51 mg twice daily.  Check BMP in a week.  Continue Jardiance. ?

## 2021-12-20 NOTE — Patient Instructions (Addendum)
Medication Instructions:  ?INCREASE YOUR ENTRESTO 49-51 MG TWICE A DAY  ? ?*If you need a refill on your cardiac medications before your next appointment, please call your pharmacy* ? ? ?Lab Work: ?BMET IN 1 WEEK  ? ?If you have labs (blood work) drawn today and your tests are completely normal, you will receive your results only by: ?MyChart Message (if you have MyChart) OR ?A paper copy in the mail ?If you have any lab test that is abnormal or we need to change your treatment, we will call you to review the results. ? ? ?Testing/Procedures: ?NONE ? ? ?Follow-Up: ? ?1 MONTH WITH PHARM D  ? ?[place] W NP  ? ?1 YEAR WITH DR Cutler  ? ?Other Instructions ?MONITOR YOUR BLOOD PRESSURE 1 TO 2 TIMES A DAY BRING YOUR READINGS TO YOUR FOLLOW UP APPOINTMENT   ? ?Important Information About Sugar ? ? ? ? ? ?

## 2021-12-30 DIAGNOSIS — Z5181 Encounter for therapeutic drug level monitoring: Secondary | ICD-10-CM | POA: Diagnosis not present

## 2021-12-30 DIAGNOSIS — I152 Hypertension secondary to endocrine disorders: Secondary | ICD-10-CM | POA: Diagnosis not present

## 2021-12-30 DIAGNOSIS — E1159 Type 2 diabetes mellitus with other circulatory complications: Secondary | ICD-10-CM | POA: Diagnosis not present

## 2021-12-31 LAB — BASIC METABOLIC PANEL
BUN/Creatinine Ratio: 10 (ref 10–24)
BUN: 12 mg/dL (ref 8–27)
CO2: 21 mmol/L (ref 20–29)
Calcium: 9.6 mg/dL (ref 8.6–10.2)
Chloride: 108 mmol/L — ABNORMAL HIGH (ref 96–106)
Creatinine, Ser: 1.24 mg/dL (ref 0.76–1.27)
Glucose: 148 mg/dL — ABNORMAL HIGH (ref 70–99)
Potassium: 4.3 mmol/L (ref 3.5–5.2)
Sodium: 144 mmol/L (ref 134–144)
eGFR: 65 mL/min/{1.73_m2} (ref >59)

## 2022-01-03 DIAGNOSIS — Z419 Encounter for procedure for purposes other than remedying health state, unspecified: Secondary | ICD-10-CM | POA: Diagnosis not present

## 2022-01-07 ENCOUNTER — Encounter: Payer: Self-pay | Admitting: Nurse Practitioner

## 2022-01-07 ENCOUNTER — Telehealth: Payer: Self-pay | Admitting: *Deleted

## 2022-01-07 ENCOUNTER — Ambulatory Visit (INDEPENDENT_AMBULATORY_CARE_PROVIDER_SITE_OTHER): Payer: Medicaid Other | Admitting: Nurse Practitioner

## 2022-01-07 VITALS — BP 150/90 | HR 96 | Ht 65.25 in | Wt 146.2 lb

## 2022-01-07 DIAGNOSIS — I252 Old myocardial infarction: Secondary | ICD-10-CM | POA: Diagnosis not present

## 2022-01-07 DIAGNOSIS — R195 Other fecal abnormalities: Secondary | ICD-10-CM | POA: Insufficient documentation

## 2022-01-07 NOTE — Telephone Encounter (Signed)
Request for surgical clearance AND CARDIAC CLEARANCE:     Endoscopy Procedure ? ?What type of surgery is being performed?     Colonoscopy  ? ?When is this surgery scheduled?     TBD ? ?What type of clearance is required ?   Pharmacy ? ?Are there any medications that need to be held prior to surgery and how long? Brilinta, 5 days  (had MI with stent placement 07/2021) ? ?Practice name and name of physician performing surgery?      [facility] ? ?What is your office phone and fax number?      Phone- [phone]  Fax- [phone] ? ?Anesthesia type (None, local, MAC, general) ?       MAC ? ?Please also advise whether patient has cardiac clearance to have procedure in May or whether we need to hold off on procedure until November. ?

## 2022-01-07 NOTE — Telephone Encounter (Signed)
Hi Dr. Oval Linsey, ? ?Mr. Randy Sanchez has an upcoming colonoscopy planned after being found to have a positive hemoccult and they are wanting to hold Brilinta. He had a NSTEMI on 07/27/2021 and was found to have 100% stenosis of ostial OM1 which was felt to be the culprit lesion. He also had high grade stenosis of mid LCX and mid to distal OM1. He underwent successful PCI with DES to LCX lesion and angioplasty to both OM1 lesions. You recently saw him on 12/20/2021 at which time he was doing well from a cardiac standpoint with no chest pain or dyspnea. Date has not been set for his colonoscopy yet. Is he OK to have this procedure and hold Brilinta for 5 days after he is 6 months out from his PCI (01/24/2022) or does he need to wait a full year? ? ?Please route response back to [facility] PREOP. ? ?Thank you! ?[name] ?
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
## 2022-01-07 NOTE — Patient Instructions (Signed)
We will be in touch with cardiology regarding whether you may have your colonoscopy in May as scheduled or whether we need to wait until November to do the procedure.  ? ?If you are age 64 or older, your body mass index should be between 23-30. Your Body mass index is 24.15 kg/m?Marland Kitchen If this is out of the aforementioned range listed, please consider follow up with your Primary Care Provider. ? ?If you are age 69 or younger, your body mass index should be between 19-25. Your Body mass index is 24.15 kg/m?Marland Kitchen If this is out of the aformentioned range listed, please consider follow up with your Primary Care Provider.  ? ?_____________________________________________________ ? ?The [facility] providers would like to encourage you to use [facility] to communicate with providers for non-urgent requests or questions.  Due to long hold times on the telephone, sending your provider a message by [facility] may be a faster and more efficient way to get a response.  Please allow 48 business hours for a response.  Please remember that this is for non-urgent requests.  ?_____________________________________________________ ?Due to recent changes in healthcare laws, you may see the results of your imaging and laboratory studies on MyChart before your provider has had a chance to review them.  We understand that in some cases there may be results that are confusing or concerning to you. Not all laboratory results come back in the same time frame and the provider may be waiting for multiple results in order to interpret others.  Please give [place] 48 hours in order for your provider to thoroughly review all the results before contacting the office for clarification of your results.  ? ?

## 2022-01-10 ENCOUNTER — Ambulatory Visit: Payer: Medicaid Other | Admitting: Neurology

## 2022-01-10 ENCOUNTER — Encounter: Payer: Self-pay | Admitting: Neurology

## 2022-01-10 VITALS — BP 146/78 | HR 72 | Ht 65.0 in | Wt 148.8 lb

## 2022-01-10 DIAGNOSIS — Z820 Family history of epilepsy and other diseases of the nervous system: Secondary | ICD-10-CM | POA: Diagnosis not present

## 2022-01-10 DIAGNOSIS — I214 Non-ST elevation (NSTEMI) myocardial infarction: Secondary | ICD-10-CM | POA: Diagnosis not present

## 2022-01-10 DIAGNOSIS — I255 Ischemic cardiomyopathy: Secondary | ICD-10-CM

## 2022-01-10 DIAGNOSIS — R0683 Snoring: Secondary | ICD-10-CM | POA: Diagnosis not present

## 2022-01-10 DIAGNOSIS — R0681 Apnea, not elsewhere classified: Secondary | ICD-10-CM

## 2022-01-10 DIAGNOSIS — R351 Nocturia: Secondary | ICD-10-CM | POA: Diagnosis not present

## 2022-01-10 NOTE — Patient Instructions (Signed)

## 2022-01-10 NOTE — Progress Notes (Signed)
Subjective:  ?  ?Patient ID: Randy Sanchez is a 66 y.o. male. ? ?HPI ? ? ? ?Star Age, MD, PhD ?[facility] ?[address], [address] ?[phone] ?[place], [place] [address] ? ?Dear Vaughan Basta, ? ?I saw your patient, Randy Sanchez, upon your kind request in my sleep clinic today for initial consultation of his sleep disorder, in particular, concern for underlying obstructive sleep apnea.  The patient is accompanied by his sister today.  As you know, Randy Sanchez is a 66 year old right-handed gentleman with an underlying medical history of coronary artery disease with status post non-STEMI in November 2022, ischemic cardiomyopathy, hypertension, hyperlipidemia, diabetes and smoking, who reports snoring, excessive daytime somnolence and witnessed apneas.  He reports that his cardiologist wanted him to have a sleep study. I reviewed your office note from 11/16/2021.  His Epworth sleepiness score is 7/24, fatigue severity score is 9/63.  He is widowed and lives alone.  He has nocturia about 2-3 times per average night.  Bedtime varies a lot, can be 10 PM or as late as 5 or 6 AM.  His wake up time is around 7 or 8 AM.  He does not currently work.  He smokes about 1 cigarette a week.  He smokes marijuana occasionally.  He does not drink alcohol daily but occasionally.  He does not drink any daily caffeine.  He has no TV in his bedroom.  He has no kids, no pets in the household.  He has a nephew with sleep apnea, sister reports that her son has a CPAP machine. ? ?His Past Medical History Is Significant For: ?Past Medical History:  ?Diagnosis Date  ? Arthritis   ? Chronic headaches   ? Diabetes mellitus type II, non insulin dependent ([place]) 07/28/2021  ? HTN, goal below 130/80 07/28/2021  ? Hyperlipidemia associated with type 2 diabetes mellitus ([place]), goal LDL < 70 07/28/2021  ? Ischemic cardiomyopathy 07/28/2021  ? NSTEMI (non-ST elevated myocardial infarction) ([place]) 07/27/2021  ? Tobacco abuse 12/20/2021   ? ? ?His Past Surgical History Is Significant For: ?Past Surgical History:  ?Procedure Laterality Date  ? CORONARY STENT INTERVENTION N/A 07/27/2021  ? Procedure: CORONARY STENT INTERVENTION;  Surgeon: Jettie Booze, MD;  Location: [place] CV [facility];  Service: Cardiovascular;  Laterality: N/A;  ? LEFT HEART CATH AND CORONARY ANGIOGRAPHY N/A 07/27/2021  ? Procedure: LEFT HEART CATH AND CORONARY ANGIOGRAPHY;  Surgeon: Jettie Booze, MD;  Location: [place] CV [facility];  Service: Cardiovascular;  Laterality: N/A;  ? ? ?His Family History Is Significant For: ?Family History  ?Problem Relation Age of Onset  ? Ovarian cancer Mother   ? Bursitis Mother   ? Arthritis Mother   ? Colitis Mother   ? Hypertension Mother   ? Stroke Father   ? Hypertension Father   ? Stroke Sister   ? Other Sister   ?     died at birth  ? Hypertension Sister   ?     died at birth  ? Other Sister   ? Stroke Sister   ? Hypertension Sister   ? Arthritis Sister   ? Anemia Sister   ? Hypertension Brother   ? Coronary artery disease Brother   ? Heart attack Brother   ? Alcoholism Brother   ? Arthritis Brother   ? Heart attack Paternal Grandmother   ? Heart attack Paternal Grandfather   ? Sleep apnea Neg Hx   ? ? ?His Social History Is Significant For: ?  Social History  ? ?Socioeconomic History  ? Marital status: Widowed  ?  Spouse name: Not on file  ? Number of children: 0  ? Years of education: Not on file  ? Highest education level: Not on file  ?Occupational History  ? Occupation: retired  ?Tobacco Use  ? Smoking status: Some Days  ?  Packs/day: 0.25  ?  Types: Cigarettes  ? Smokeless tobacco: Never  ?Vaping Use  ? Vaping Use: Never used  ?Substance and Sexual Activity  ? Alcohol use: Yes  ?  Alcohol/week: 2.0 standard drinks  ?  Types: 2 Shots of liquor per week  ?  Comment: 10-15 shot of Midge Minium every other day  ? Drug use: Not Currently  ?  Types: Cocaine, Marijuana  ?  Comment: last week  ? Sexual activity: Not on file  ?Other  Topics Concern  ? Not on file  ?Social History Narrative  ? Not on file  ? ?Social Determinants of Health  ? ?Financial Resource Strain: Not on file  ?Food Insecurity: Not on file  ?Transportation Needs: Not on file  ?Physical Activity: Not on file  ?Stress: Not on file  ?Social Connections: Not on file  ? ? ?His Allergies Are:  ?No Known Allergies:  ? ?His Current Medications Are:  ?Outpatient Encounter Medications as of 01/10/2022  ?Medication Sig  ? aspirin EC 81 MG tablet Take 81 mg by mouth daily. Swallow whole.  ? atorvastatin (LIPITOR) 80 MG tablet Take 1 tablet (80 mg total) by mouth daily.  ? Blood Pressure KIT CHECK YOUR BLOOD PRESSURE TWICE A DAY  ? carvedilol (COREG) 6.25 MG tablet Take 1 tablet (6.25 mg total) by mouth 2 (two) times daily with a meal.  ? ezetimibe (ZETIA) 10 MG tablet Take 1 tablet (10 mg total) by mouth daily.  ? isosorbide mononitrate (IMDUR) 30 MG 24 hr tablet TAKE ONE (1) TABLET BY MOUTH EVERY DAY  ? JARDIANCE 10 MG TABS tablet TAKE ONE (1) TABLET BY MOUTH EVERY DAY  ? nitroGLYCERIN (NITROSTAT) 0.4 MG SL tablet Place 1 tablet (0.4 mg total) under the tongue every 5 (five) minutes x 3 doses as needed for chest pain. (Patient taking differently: Place 0.4 mg under the tongue as needed for chest pain.)  ? sacubitril-valsartan (ENTRESTO) 49-51 MG Take 1 tablet by mouth 2 (two) times daily.  ? spironolactone (ALDACTONE) 25 MG tablet Take 0.5 tablets (12.5 mg total) by mouth daily.  ? ticagrelor (BRILINTA) 90 MG TABS tablet Take 1 tablet (90 mg total) by mouth 2 (two) times daily.  ? ?No facility-administered encounter medications on file as of 01/10/2022.  ?: ? ? ?Review of Systems:  ?Out of a complete 14 point review of systems, all are reviewed and negative with the exception of these symptoms as listed below: ? ?Review of Systems  ?Neurological:   ?     Pt is here for sleep consult   pt states he snores ,hypertension, headaches. Pt denies fatigue ,sleep study, and CPAP machine  Pt had  Heart attack 07/2021 ? ?ESS;7 ?FSS:9  ? ?Objective:  ?Neurological Exam ? ?Physical Exam ?Physical Examination:  ? ?Vitals:  ? 01/10/22 1235  ?BP: (!) 146/78  ?Pulse: 72  ? ? ?General Examination: The patient is a very pleasant 66 y.o. male in no acute distress. He appears well-developed and well-nourished and well groomed.  ? ?HEENT: Normocephalic, atraumatic, pupils are equal, round and reactive to light, extraocular tracking is good without limitation to gaze  excursion or nystagmus noted. Hearing is grossly intact. Face is symmetric with normal facial animation. Speech is clear with no dysarthria noted. There is no hypophonia. There is no lip, neck/head, jaw or voice tremor. Neck is supple with full range of passive and active motion. There are no carotid bruits on auscultation. Oropharynx exam reveals: moderate mouth dryness, marginal dental hygiene with nearly edentulous state on top and several missing teeth on the bottom and moderate airway crowding, due to redundant soft palate and larger uvula, tonsils on the smaller side. Mallampati is class III. Tongue protrudes centrally and palate elevates symmetrically. Neck size of 15.5 in.  ? ?Chest: Clear to auscultation without wheezing, rhonchi or crackles noted. ? ?Heart: S1+S2+0, regular and normal without murmurs, rubs or gallops noted.  ? ?Abdomen: Soft, non-tender and non-distended. ? ?Extremities: There is no pitting edema in the distal lower extremities bilaterally.  ? ?Skin: Warm and dry without trophic changes noted.  ? ?Musculoskeletal: exam reveals no obvious joint deformities.  ? ?Neurologically:  ?Mental status: The patient is awake, alert and oriented in all 4 spheres. His immediate and remote memory, attention, language skills and fund of knowledge are appropriate. There is no evidence of aphasia, agnosia, apraxia or anomia. Speech is clear with normal prosody and enunciation. Thought process is linear. Mood is normal and affect is normal.  ?Cranial  nerves II - XII are as described above under HEENT exam.  ?Motor exam: Normal bulk, strength and tone is noted. There is no obvious tremor. Fine motor skills and coordination: grossly intact.  ?[place]

## 2022-01-12 NOTE — Progress Notes (Signed)
Agree with the assessment and plan as outlined by Randy Best, NP.  ? ?Typically like to wait 12 months after PCI with DES for endoscopic procedures, which allows for safe holding of antiplatelet therapy.  I understand patient would like to get this done sooner due to recent positive Cologuard test.  There are data to proceed 6 months after PCI, but I would certainly defer to the Cardiologist regarding safe timing.  Given his overall stability and that this is mostly a screening procedure, I agree with your clinical judgment that waiting 12 months s/p PCI should be reasonable.  Further, I agree that if Brilinta cannot be held, this would be mostly a diagnostic colonoscopy and unable to resect any larger polyps, and he may need to return sooner for repeat colonoscopy with polypectomy.  Alternatively, as you pointed out, can proceed with colonoscopy in 07/2022, at which time the Randy Sanchez is more likely to be held safely and can perform all endoscopic interventions as needed. ? ?If patient is still insistent on proceeding sooner, we will need to send a request to his Cardiologist for decision of 1) proceeding with colonoscopy now with Brilinta hold vs 2) colonoscopy now without holding Brilinta vs 3) waiting until 07/2022 to schedule colonoscopy. ? ? ?Randy Lottman, DO, FACG ?[facility] ? ?

## 2022-01-17 ENCOUNTER — Telehealth: Payer: Self-pay | Admitting: *Deleted

## 2022-01-17 NOTE — Telephone Encounter (Signed)
? ? ?  Name: Randy Sanchez  ?DOB: 1956-06-02  ?MRN: [id number] ? ?Primary Cardiologist: Skeet Latch, MD ? ? ?Preoperative team, please contact this patient and set up a phone call appointment for further preoperative risk assessment. Please obtain consent and complete medication review. Thank you for your help. ? ?I confirm that guidance regarding antiplatelet and oral anticoagulation therapy has been completed and, if necessary, noted below. ? ?Per Dr. Oval Linsey, patient may hold Brilinta for 5 days prior to the procedure (however, not before 01/24/2022).  ? ?Thank you, ? ?Lenna Sciara, NP ?01/17/2022, 9:18 AM ?[facility] ?[address] ?[place], [place] [address] ? ? ?

## 2022-01-17 NOTE — Telephone Encounter (Signed)
S/w DPR pt's sister Thayer Headings who has scheduled a tele pre op appt 01/19/22 @ 4pm for the pt. Med rec and consent are done. ?  ?

## 2022-01-17 NOTE — Telephone Encounter (Signed)
S/w DPR pt's sister Thayer Headings who has scheduled a tele pre op appt 01/19/22 @ 4pm for the pt. Med rec and consent are done. ? ?  ?Patient Consent for Virtual Visit  ? ? ?   ? ?Randy Sanchez has provided verbal consent on 01/17/2022 for a virtual visit (video or telephone). ? ? ?CONSENT FOR VIRTUAL VISIT FOR:  Randy Sanchez  ?By participating in this virtual visit I agree to the following: ? ?I hereby voluntarily request, consent and authorize [facility] and its employed or contracted physicians, physician assistants, nurse practitioners or other licensed health care professionals (the Practitioner), to provide me with telemedicine health care services (the ?Services") as deemed necessary by the treating Practitioner. I acknowledge and consent to receive the Services by the Practitioner via telemedicine. I understand that the telemedicine visit will involve communicating with the Practitioner through live audiovisual communication technology and the disclosure of certain medical information by electronic transmission. I acknowledge that I have been given the opportunity to request an in-person assessment or other available alternative prior to the telemedicine visit and am voluntarily participating in the telemedicine visit. ? ?I understand that I have the right to withhold or withdraw my consent to the use of telemedicine in the course of my care at any time, without affecting my right to future care or treatment, and that the Practitioner or I may terminate the telemedicine visit at any time. I understand that I have the right to inspect all information obtained and/or recorded in the course of the telemedicine visit and may receive copies of available information for a reasonable fee.  I understand that some of the potential risks of receiving the Services via telemedicine include:  ?Delay or interruption in medical evaluation due to technological equipment failure or disruption; ?Information transmitted may not  be sufficient (e.g. poor resolution of images) to allow for appropriate medical decision making by the Practitioner; and/or  ?In rare instances, security protocols could fail, causing a breach of personal health information. ? ?Furthermore, I acknowledge that it is my responsibility to provide information about my medical history, conditions and care that is complete and accurate to the best of my ability. I acknowledge that Practitioner's advice, recommendations, and/or decision may be based on factors not within their control, such as incomplete or inaccurate data provided by me or distortions of diagnostic images or specimens that may result from electronic transmissions. I understand that the practice of medicine is not an exact science and that Practitioner makes no warranties or guarantees regarding treatment outcomes. I acknowledge that a copy of this consent can be made available to me via my patient portal ([facility]), or I can request a printed copy by calling the office of [facility].   ? ?I understand that my insurance will be billed for this visit.  ? ?I have read or had this consent read to me. ?I understand the contents of this consent, which adequately explains the benefits and risks of the Services being provided via telemedicine.  ?I have been provided ample opportunity to ask questions regarding this consent and the Services and have had my questions answered to my satisfaction. ?I give my informed consent for the services to be provided through the use of telemedicine in my medical care ? ? ? ?

## 2022-01-18 NOTE — Telephone Encounter (Signed)
I have spoken to patient's sister/caregiver Thayer Headings to explain that I am aware an office appointment is scheduled with cardiology tomorrow for further discussion of procedure and brilinta hold at the 6 month mark rather than holding off until the 1 year mark. However, it is already notated that patient will not be allowed to hold his brilinta any sooner than 01/24/22. Patient is currently scheduled for 01/25/22 colonoscopy appointment so this would not give him enough time to hold brilinta x 5 days. Therefore, I have rescheduled the procedure for 02/2022 and have advised Thayer Headings that we will still need to get definite clearance prior to proceeding with the test at that time. She verbalizes understanding of this information. ?

## 2022-01-19 ENCOUNTER — Ambulatory Visit (INDEPENDENT_AMBULATORY_CARE_PROVIDER_SITE_OTHER): Payer: Medicaid Other | Admitting: Nurse Practitioner

## 2022-01-19 ENCOUNTER — Telehealth (HOSPITAL_BASED_OUTPATIENT_CLINIC_OR_DEPARTMENT_OTHER): Payer: Self-pay | Admitting: Cardiovascular Disease

## 2022-01-19 ENCOUNTER — Encounter: Payer: Self-pay | Admitting: Nurse Practitioner

## 2022-01-19 ENCOUNTER — Ambulatory Visit (INDEPENDENT_AMBULATORY_CARE_PROVIDER_SITE_OTHER): Payer: Medicaid Other | Admitting: Pharmacist

## 2022-01-19 VITALS — BP 116/66 | HR 71 | Resp 18 | Ht 65.0 in | Wt 150.0 lb

## 2022-01-19 DIAGNOSIS — I5022 Chronic systolic (congestive) heart failure: Secondary | ICD-10-CM | POA: Diagnosis not present

## 2022-01-19 DIAGNOSIS — I255 Ischemic cardiomyopathy: Secondary | ICD-10-CM | POA: Diagnosis not present

## 2022-01-19 DIAGNOSIS — I2510 Atherosclerotic heart disease of native coronary artery without angina pectoris: Secondary | ICD-10-CM | POA: Diagnosis not present

## 2022-01-19 DIAGNOSIS — Z01810 Encounter for preprocedural cardiovascular examination: Secondary | ICD-10-CM | POA: Diagnosis not present

## 2022-01-19 NOTE — Telephone Encounter (Signed)
Error

## 2022-01-19 NOTE — Patient Instructions (Addendum)
It was nice meeting you today ? ?We would like to keep your blood pressure less than 130/80 ? ?Please continue your: ? ?Carvedilol 6.'25mg'$  twice a day ?Imdur '30mg'$  daily ?Jardiance '10mg'$  daily ?Entresto 49-'51mg'$  twice a day ?Spironolactone 12.'5mg'$  daily ? ?Try to watch how much salt you are eating.  Let us know if you continue to experience and shortness of breath ? ?Please call with any questions ? ?Karren Cobble, PharmD, BCACP, [place], CPP ?[address], [address] ?[place], [place], [address] ?Phone: [phone], Fax: [phone]  ?
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
## 2022-01-19 NOTE — Progress Notes (Signed)
? ?Virtual Visit via Telephone Note  ? ?This visit type was conducted due to national recommendations for restrictions regarding the COVID-19 Pandemic (e.g. social distancing) in an effort to limit this patient's exposure and mitigate transmission in our community.  Due to his co-morbid illnesses, this patient is at least at moderate risk for complications without adequate follow up.  This format is felt to be most appropriate for this patient at this time.  The patient did not have access to video technology/had technical difficulties with video requiring transitioning to audio format only (telephone).  All issues noted in this document were discussed and addressed.  No physical exam could be performed with this format.  Please refer to the patient's chart for his  consent to telehealth for [facility]. ? ?Evaluation Performed:  Preoperative cardiovascular risk assessment ?_____________  ? ?Date:  01/19/2022  ? ?Patient ID:  Randy Sanchez, DOB 08-Mar-1956, MRN [id number] ?Patient Location:  ?Home ?Provider location:   ?Office ? ?Primary Care Provider:  Baruch Gouty, FNP ?Primary Cardiologist:  Skeet Latch, MD ? ?Chief Complaint  ?  ?66 y.o. y/o male with a h/o CAD s/p NSTEMI, DES-LCx in 07/2021, chronic combined systolic and diastolic heart failure, ICM, hypertension, hyperlipidemia, type 2 diabetes, and tobacco use who is pending colonoscopy, date TBD, with [facility], and presents today for telephonic preoperative cardiovascular risk assessment. ? ?Past Medical History  ?  ?Past Medical History:  ?Diagnosis Date  ? Arthritis   ? Chronic headaches   ? Diabetes mellitus type II, non insulin dependent ([facility]) 07/28/2021  ? HTN, goal below 130/80 07/28/2021  ? Hyperlipidemia associated with type 2 diabetes mellitus ([place]), goal LDL < 70 07/28/2021  ? Ischemic cardiomyopathy 07/28/2021  ? NSTEMI (non-ST elevated myocardial infarction) ([place]) 07/27/2021  ? Tobacco abuse 12/20/2021  ? ?Past Surgical  History:  ?Procedure Laterality Date  ? CORONARY STENT INTERVENTION N/A 07/27/2021  ? Procedure: CORONARY STENT INTERVENTION;  Surgeon: Jettie Booze, MD;  Location: [facility];  Service: Cardiovascular;  Laterality: N/A;  ? LEFT HEART CATH AND CORONARY ANGIOGRAPHY N/A 07/27/2021  ? Procedure: LEFT HEART CATH AND CORONARY ANGIOGRAPHY;  Surgeon: Jettie Booze, MD;  Location: [facility];  Service: Cardiovascular;  Laterality: N/A;  ? ? ?Allergies ? ?No Known Allergies ? ?History of Present Illness  ?  ?Randy Sanchez is a 66 y.o. male who presents via audio/video conferencing for a telehealth visit today. Pt was last seen in cardiology clinic on 12/20/2021 by Dr. Oval Linsey.  At that time Randy Sanchez was doing well from a cardiac standpoint. He denied symptoms concerning for angina The patient is now pending procedure as outlined above. Since his last visit, he has been stable from a cardiac standpoint.  He does report occasional lightheadedness, as well as an occasional "fluttering in his heart", this lasts for seconds at a time, with no associated symptoms. He denies chest pain, dyspnea, pnd, orthopnea, n, v, dizziness, syncope, edema, weight gain, or early satiety.  He reports feeling well overall, and denies any new symptoms or concerns today. ? ?Home Medications  ?  ?Prior to Admission medications   ?Medication Sig Start Date End Date Taking? Authorizing Provider  ?aspirin EC 81 MG tablet Take 81 mg by mouth daily. Swallow whole.    [provider]  ?atorvastatin (LIPITOR) 80 MG tablet Take 1 tablet (80 mg total) by mouth daily. 08/17/21   Loel Dubonnet, NP  ?Blood Pressure KIT CHECK YOUR BLOOD PRESSURE  TWICE A DAY 12/20/21   Skeet Latch, MD  ?carvedilol (COREG) 6.25 MG tablet Take 1 tablet (6.25 mg total) by mouth 2 (two) times daily with a meal. 08/17/21   Loel Dubonnet, NP  ?ezetimibe (ZETIA) 10 MG tablet Take 1 tablet (10 mg total) by mouth daily. 11/16/21  02/14/22  Loel Dubonnet, NP  ?isosorbide mononitrate (IMDUR) 30 MG 24 hr tablet TAKE ONE (1) TABLET BY MOUTH EVERY DAY 11/25/21   Loel Dubonnet, NP  ?JARDIANCE 10 MG TABS tablet TAKE ONE (1) TABLET BY MOUTH EVERY DAY 11/16/21   Loel Dubonnet, NP  ?nitroGLYCERIN (NITROSTAT) 0.4 MG SL tablet Place 1 tablet (0.4 mg total) under the tongue every 5 (five) minutes x 3 doses as needed for chest pain. ?Patient taking differently: Place 0.4 mg under the tongue as needed for chest pain. 08/17/21   Loel Dubonnet, NP  ?sacubitril-valsartan (ENTRESTO) 49-51 MG Take 1 tablet by mouth 2 (two) times daily. 12/20/21   Skeet Latch, MD  ?spironolactone (ALDACTONE) 25 MG tablet Take 0.5 tablets (12.5 mg total) by mouth daily. 11/16/21 02/14/22  Loel Dubonnet, NP  ?ticagrelor (BRILINTA) 90 MG TABS tablet Take 1 tablet (90 mg total) by mouth 2 (two) times daily. 08/17/21   Loel Dubonnet, NP  ? ? ?Physical Exam  ?  ?Vital Signs:  Randy Sanchez does not have vital signs available for review today. ? ?Given telephonic nature of communication, physical exam is limited. ?AAOx3. NAD. Normal affect.  Speech and respirations are unlabored. ? ?Accessory Clinical Findings  ?  ?None ? ?Assessment & Plan  ?  ?1.  Preoperative Cardiovascular Risk Assessment: ? ?According to the Revised Cardiac Risk Index (RCRI), his Perioperative Risk of Major Cardiac Event is (%): 6.6. His Functional Capacity in METs is: 5.07 according to the [facility] Activity Status Index (DASI). Therefore, based on ACC/AHA guidelines, patient would be at acceptable risk for the planned procedure without further cardiovascular testing.  ? ?Per Dr. Oval Linsey, patient may hold Brilinta for 5 days prior to the procedure (however, not before 01/24/2022).  Please resume Brilinta as soon as possible postprocedure, at the discretion of the surgeon. ? ?A copy of this note will be routed to requesting surgeon. ? ?Time:   ?Today, I have spent 7 minutes with the patient  with telehealth technology discussing medical history, symptoms, and management plan.   ? ? ?Lenna Sciara, NP ? ?01/19/2022, 4:30 PM ? ?

## 2022-01-21 NOTE — Telephone Encounter (Signed)
Randy Sanchez and Dr Bryan Lemma-  Please see patient's recent cardiology note. It appears patient was cleared to hold Brilinta x 5 days for procedure as long as scheduled after 01/24/22. Patient was originally scheduled for 01/25/22, but I had already rescheduled to 02/2022 appointment with this knowledge.   I see some discussion about whether patient should just hold off on procedure until 1 year on anticoag vs going ahead with procedure at 6 months. After cardiology input, do you feel it appropriate to complete procedure in June as currently scheduled or would you like to hold off longer?

## 2022-01-24 NOTE — Telephone Encounter (Signed)
Jan 24, 2022 Lavena Bullion, DO to Me  [name], NP   8:02 AM Based on recent virtual appointment in the Cardiology Clinic, patient has been cleared to proceed with the procedure.  Notes reviewed and patient was felt "at acceptable risk for the planned procedure without further cardiovascular testing" and cleared to proceed with Brilinta hold 5 days prior to procedure, with plan to resume Brilinta as soon as possible postprocedure.   Ok to proceed with colonoscopy in June as planned.

## 2022-01-24 NOTE — Telephone Encounter (Signed)
I have spoken to [place], Mr.Halleck's sister/caregiver to advise that cardiology has given clearance for him to proceed with colonoscopy as scheduled in June and to hold Brilinta 5 days prior to that procedure. Thayer Headings verbalizes understanding and indicates that she will make sure patient holds his anticoagulation x 5 days for procedure. In addition, I have sent updated instructions to the patient's home address that reflect the updated time/date for procedure.

## 2022-01-25 ENCOUNTER — Encounter: Payer: Medicaid Other | Admitting: Gastroenterology

## 2022-01-28 DIAGNOSIS — N182 Chronic kidney disease, stage 2 (mild): Secondary | ICD-10-CM | POA: Diagnosis not present

## 2022-01-28 DIAGNOSIS — R809 Proteinuria, unspecified: Secondary | ICD-10-CM | POA: Diagnosis not present

## 2022-01-28 DIAGNOSIS — B3749 Other urogenital candidiasis: Secondary | ICD-10-CM | POA: Diagnosis not present

## 2022-01-28 DIAGNOSIS — I2510 Atherosclerotic heart disease of native coronary artery without angina pectoris: Secondary | ICD-10-CM | POA: Diagnosis not present

## 2022-01-28 DIAGNOSIS — I5020 Unspecified systolic (congestive) heart failure: Secondary | ICD-10-CM | POA: Diagnosis not present

## 2022-01-28 DIAGNOSIS — E1122 Type 2 diabetes mellitus with diabetic chronic kidney disease: Secondary | ICD-10-CM | POA: Diagnosis not present

## 2022-01-28 DIAGNOSIS — I129 Hypertensive chronic kidney disease with stage 1 through stage 4 chronic kidney disease, or unspecified chronic kidney disease: Secondary | ICD-10-CM | POA: Diagnosis not present

## 2022-02-03 DIAGNOSIS — Z419 Encounter for procedure for purposes other than remedying health state, unspecified: Secondary | ICD-10-CM | POA: Diagnosis not present

## 2022-02-08 ENCOUNTER — Telehealth: Payer: Self-pay | Admitting: Neurology

## 2022-02-08 NOTE — Telephone Encounter (Signed)
[facility] pending faxed notes. Case # [id number], fax # [phone].

## 2022-02-11 DIAGNOSIS — N182 Chronic kidney disease, stage 2 (mild): Secondary | ICD-10-CM | POA: Diagnosis not present

## 2022-02-14 NOTE — Telephone Encounter (Signed)
Checked the portal it is still pending.  

## 2022-02-16 ENCOUNTER — Ambulatory Visit (INDEPENDENT_AMBULATORY_CARE_PROVIDER_SITE_OTHER): Payer: Medicaid Other | Admitting: Family Medicine

## 2022-02-16 ENCOUNTER — Encounter: Payer: Self-pay | Admitting: Family Medicine

## 2022-02-16 VITALS — BP 127/74 | HR 64 | Temp 98.7°F | Ht 65.0 in | Wt 148.0 lb

## 2022-02-16 DIAGNOSIS — R195 Other fecal abnormalities: Secondary | ICD-10-CM

## 2022-02-16 DIAGNOSIS — M25521 Pain in right elbow: Secondary | ICD-10-CM | POA: Diagnosis not present

## 2022-02-16 DIAGNOSIS — I152 Hypertension secondary to endocrine disorders: Secondary | ICD-10-CM

## 2022-02-16 DIAGNOSIS — E785 Hyperlipidemia, unspecified: Secondary | ICD-10-CM

## 2022-02-16 DIAGNOSIS — E1169 Type 2 diabetes mellitus with other specified complication: Secondary | ICD-10-CM | POA: Diagnosis not present

## 2022-02-16 DIAGNOSIS — E1159 Type 2 diabetes mellitus with other circulatory complications: Secondary | ICD-10-CM | POA: Diagnosis not present

## 2022-02-16 DIAGNOSIS — M25522 Pain in left elbow: Secondary | ICD-10-CM | POA: Diagnosis not present

## 2022-02-16 LAB — BAYER DCA HB A1C WAIVED: HB A1C (BAYER DCA - WAIVED): 7.2 % — ABNORMAL HIGH (ref 4.8–5.6)

## 2022-02-16 MED ORDER — DICLOFENAC SODIUM 1 % EX GEL: 2.0000 g | Freq: Four times a day (QID) | CUTANEOUS | 0 refills | Status: DC

## 2022-02-16 NOTE — Patient Instructions (Addendum)

## 2022-02-16 NOTE — Progress Notes (Signed)
Subjective:  Patient ID: Randy Sanchez, male    DOB: April 26, 1956, 66 y.o.   MRN: [id number]  Patient Care Team: Baruch Gouty, FNP as PCP - General (Family Medicine) Skeet Latch, MD as PCP - Cardiology (Cardiology)   Chief Complaint:  Medical Management of Chronic Issues   HPI: Randy Sanchez is a 66 y.o. male presenting on 02/16/2022 for Medical Management of Chronic Issues    1. Type 2 diabetes mellitus with other specified complication, without long-term current use of insulin (HCC) Has been taking medications as prescribed but does not follow a diet or exercise routine. Denies polyuria, polyphagia, or polydipsia. No neuropathy symptoms reported. Scheduled for eye exam next month.   2. Hyperlipidemia associated with type 2 diabetes mellitus ([place]), goal LDL < 70 Current on statin and Zetia therapy without associated myalgias. Does not follow a diet or exercise routine.   3. Hypertension associated with type 2 diabetes mellitus ([place]) Compliant with medication regimen. No chest pain, headaches, palpitations, shortness of breath, visual changes, dizziness, confusion, weakness, leg swelling, or syncope.   4. Bilateral elbow joint pain Reports bilateral elbow pain and swelling at night when he is trying to sleep. Denies injuries. Has tried Tylenol without relief of symptoms. States the pain is aching in nature, 6/10, worse at night. Nothing relieves the pain.   5. Positive colorectal cancer screening using Cologuard test Has been seen by GI and has colonoscopy scheduled. He denies weight loss, hematochezia, or melena. No night sweats, fever, chills, or weakness.      Relevant past medical, surgical, family, and social history reviewed and updated as indicated.  Allergies and medications reviewed and updated. Data reviewed: Chart in Epic.   Past Medical History:  Diagnosis Date   Arthritis    Chronic headaches    Diabetes mellitus type II, non insulin dependent ([place])  07/28/2021   HTN, goal below 130/80 07/28/2021   Hyperlipidemia associated with type 2 diabetes mellitus ([place]), goal LDL < 70 07/28/2021   Ischemic cardiomyopathy 07/28/2021   NSTEMI (non-ST elevated myocardial infarction) ([place]) 07/27/2021   Tobacco abuse 12/20/2021    Past Surgical History:  Procedure Laterality Date   CORONARY STENT INTERVENTION N/A 07/27/2021   Procedure: CORONARY STENT INTERVENTION;  Surgeon: Jettie Booze, MD;  Location: [facility];  Service: Cardiovascular;  Laterality: N/A;   LEFT HEART CATH AND CORONARY ANGIOGRAPHY N/A 07/27/2021   Procedure: LEFT HEART CATH AND CORONARY ANGIOGRAPHY;  Surgeon: Jettie Booze, MD;  Location: [facility];  Service: Cardiovascular;  Laterality: N/A;    Social History   Socioeconomic History   Marital status: Widowed    Spouse name: Not on file   Number of children: 0   Years of education: Not on file   Highest education level: Not on file  Occupational History   Occupation: retired  Tobacco Use   Smoking status: Some Days    Packs/day: 0.25    Types: Cigarettes   Smokeless tobacco: Never  Vaping Use   Vaping Use: Never used  Substance and Sexual Activity   Alcohol use: Yes    Alcohol/week: 2.0 standard drinks of alcohol    Types: 2 Shots of liquor per week    Comment: 10-15 shot of Barnabas Lister daniel every other day   Drug use: Not Currently    Types: Cocaine, Marijuana    Comment: last week   Sexual activity: Not Currently  Other Topics Concern   Not on file  Social History Narrative   Not on file   Social Determinants of Health   Financial Resource Strain: Not on file  Food Insecurity: Not on file  Transportation Needs: Not on file  Physical Activity: Not on file  Stress: Not on file  Social Connections: Not on file  Intimate Partner Violence: Not on file    Outpatient Encounter Medications as of 02/16/2022  Medication Sig   aspirin EC 81 MG tablet Take 81 mg by mouth daily.  Swallow whole.   atorvastatin (LIPITOR) 80 MG tablet Take 1 tablet (80 mg total) by mouth daily.   Blood Pressure KIT CHECK YOUR BLOOD PRESSURE TWICE A DAY   carvedilol (COREG) 6.25 MG tablet Take 1 tablet (6.25 mg total) by mouth 2 (two) times daily with a meal.   diclofenac Sodium (VOLTAREN) 1 % GEL Apply 2 g topically 4 (four) times daily.   isosorbide mononitrate (IMDUR) 30 MG 24 hr tablet TAKE ONE (1) TABLET BY MOUTH EVERY DAY   JARDIANCE 10 MG TABS tablet TAKE ONE (1) TABLET BY MOUTH EVERY DAY   nitroGLYCERIN (NITROSTAT) 0.4 MG SL tablet Place 1 tablet (0.4 mg total) under the tongue every 5 (five) minutes x 3 doses as needed for chest pain. (Patient taking differently: Place 0.4 mg under the tongue as needed for chest pain.)   sacubitril-valsartan (ENTRESTO) 49-51 MG Take 1 tablet by mouth 2 (two) times daily.   ticagrelor (BRILINTA) 90 MG TABS tablet Take 1 tablet (90 mg total) by mouth 2 (two) times daily.   ezetimibe (ZETIA) 10 MG tablet Take 1 tablet (10 mg total) by mouth daily.   spironolactone (ALDACTONE) 25 MG tablet Take 0.5 tablets (12.5 mg total) by mouth daily.   No facility-administered encounter medications on file as of 02/16/2022.    No Known Allergies  Review of Systems  Constitutional:  Negative for activity change, appetite change, chills, diaphoresis, fatigue, fever and unexpected weight change.  HENT: Negative.    Eyes: Negative.  Negative for photophobia and visual disturbance.  Respiratory:  Negative for cough, chest tightness and shortness of breath.   Cardiovascular:  Negative for chest pain, palpitations and leg swelling.  Gastrointestinal:  Negative for abdominal pain, blood in stool, constipation, diarrhea, nausea and vomiting.  Endocrine: Negative.  Negative for polydipsia, polyphagia and polyuria.  Genitourinary:  Negative for decreased urine volume, dysuria, frequency and urgency.  Musculoskeletal:  Positive for arthralgias and joint swelling. Negative  for back pain, gait problem, myalgias, neck pain and neck stiffness.  Skin: Negative.   Allergic/Immunologic: Negative.   Neurological:  Negative for dizziness, tremors, seizures, syncope, facial asymmetry, speech difficulty, weakness, light-headedness, numbness and headaches.  Hematological: Negative.   Psychiatric/Behavioral:  Negative for confusion, hallucinations, sleep disturbance and suicidal ideas.   All other systems reviewed and are negative.       Objective:  BP 127/74   Pulse 64   Temp 98.7 F (37.1 C)   Ht _0  (1.651 m)   Wt 148 lb (67.1 kg)   SpO2 97%   BMI 24.63 kg/m    Wt Readings from Last 3 Encounters:  02/16/22 148 lb (67.1 kg)  01/19/22 150 lb (68 kg)  01/10/22 148 lb 12.8 oz (67.5 kg)    Physical Exam Vitals and nursing note reviewed.  Constitutional:      General: He is not in acute distress.    Appearance: Normal appearance. He is well-developed and well-groomed. He is not ill-appearing, toxic-appearing or diaphoretic.  HENT:  Head: Normocephalic and atraumatic.     Jaw: There is normal jaw occlusion.     Right Ear: Hearing normal.     Left Ear: Hearing normal.     Nose: Nose normal.     Mouth/Throat:     Lips: Pink.     Mouth: Mucous membranes are moist.     Pharynx: Oropharynx is clear. Uvula midline.  Eyes:     General: Lids are normal.     Extraocular Movements: Extraocular movements intact.     Conjunctiva/sclera: Conjunctivae normal.     Pupils: Pupils are equal, round, and reactive to light.  Neck:     Thyroid: No thyroid mass, thyromegaly or thyroid tenderness.     Vascular: No carotid bruit or JVD.     Trachea: Trachea and phonation normal.  Cardiovascular:     Rate and Rhythm: Normal rate and regular rhythm.     Chest Wall: PMI is not displaced.     Pulses: Normal pulses.     Heart sounds: Normal heart sounds. No murmur heard.    No friction rub. No gallop.  Pulmonary:     Effort: Pulmonary effort is normal. No  respiratory distress.     Breath sounds: Normal breath sounds. No wheezing.  Abdominal:     General: Bowel sounds are normal. There is no abdominal bruit.     Palpations: Abdomen is soft. There is no hepatomegaly or splenomegaly.  Musculoskeletal:        General: Normal range of motion.     Right upper arm: Normal.     Left upper arm: Normal.     Right elbow: Normal.     Left elbow: Normal.     Right forearm: Normal.     Left forearm: Normal.     Cervical back: Normal range of motion and neck supple.     Right lower leg: No edema.     Left lower leg: No edema.  Lymphadenopathy:     Cervical: No cervical adenopathy.  Skin:    General: Skin is warm and dry.     Capillary Refill: Capillary refill takes less than 2 seconds.     Coloration: Skin is not cyanotic, jaundiced or pale.     Findings: No rash.  Neurological:     General: No focal deficit present.     Mental Status: He is alert and oriented to person, place, and time.     Sensory: Sensation is intact.     Motor: Motor function is intact.     Coordination: Coordination is intact.     Gait: Gait is intact.     Deep Tendon Reflexes: Reflexes are normal and symmetric.  Psychiatric:        Attention and Perception: Attention and perception normal.        Mood and Affect: Mood and affect normal.        Speech: Speech normal.        Behavior: Behavior normal. Behavior is cooperative.        Thought Content: Thought content normal.        Cognition and Memory: Cognition and memory normal.        Judgment: Judgment normal.     Results for orders placed or performed in visit on 87/86/76  Basic metabolic panel  Result Value Ref Range   Glucose 148 (H) 70 - 99 mg/dL   BUN 12 8 - 27 mg/dL   Creatinine, Ser 1.24 0.76 - 1.27 mg/dL  eGFR 65 >59 mL/min/1.73   BUN/Creatinine Ratio 10 10 - 24   Sodium 144 134 - 144 mmol/L   Potassium 4.3 3.5 - 5.2 mmol/L   Chloride 108 (H) 96 - 106 mmol/L   CO2 21 20 - 29 mmol/L   Calcium 9.6  8.6 - 10.2 mg/dL       Pertinent labs & imaging results that were available during my care of the patient were reviewed by me and considered in my medical decision making.  Assessment & Plan:  Osvaldo was seen today for medical management of chronic issues.  Diagnoses and all orders for this visit:  Type 2 diabetes mellitus with other specified complication, without long-term current use of insulin ([place]) A1C 7.2 in office today. Diet and exercise discussed in detail. Pt will make dietary/lifestyle changes over the next 3 months. If A1C remains above 7, will adjust medications at this time.  -     Bayer DCA Hb A1c Waived  Hyperlipidemia associated with type 2 diabetes mellitus ([place]), goal LDL < 70 Diet encouraged - increase intake of fresh fruits and vegetables, increase intake of lean proteins. Bake, broil, or grill foods. Avoid fried, greasy, and fatty foods. Avoid fast foods. Increase intake of fiber-rich whole grains. Exercise encouraged - at least 150 minutes per week and advance as tolerated. Continue medications as prescribed. Follow up in 3-6 months as discussed.   Hypertension associated with type 2 diabetes mellitus (HCC) BP well controlled. Changes were not made in regimen today. Goal BP is 130/80. Pt aware to report any persistent high or low readings. DASH diet and exercise encouraged. Exercise at least 150 minutes per week and increase as tolerated. Goal BMI > 25. Stress management encouraged. Avoid nicotine and tobacco product use. Avoid excessive alcohol and NSAID's. Avoid more than 2000 mg of sodium daily. Medications as prescribed. Follow up as scheduled.   Bilateral elbow joint pain Will trial topical Voltaren gel. Symptomatic care discussed in detail. Aware to report new, worsening, or persistent symptoms.  -     diclofenac Sodium (VOLTAREN) 1 % GEL; Apply 2 g topically 4 (four) times daily.  Positive colorectal cancer screening using Cologuard test Scheduled for  colonoscopy next month.    Continue all other maintenance medications.  Follow up plan: Return in about 3 months (around 05/19/2022), or if symptoms worsen or fail to improve, for DM.   Continue healthy lifestyle choices, including diet (rich in fruits, vegetables, and lean proteins, and low in salt and simple carbohydrates) and exercise (at least 30 minutes of moderate physical activity daily).  Educational handout given for DM  The above assessment and management plan was discussed with the patient. The patient verbalized understanding of and has agreed to the management plan. Patient is aware to call the clinic if they develop any new symptoms or if symptoms persist or worsen. Patient is aware when to return to the clinic for a follow-up visit. Patient educated on when it is appropriate to go to the emergency department.   Monia Pouch, FNP-C [facility] [phone]

## 2022-02-24 NOTE — Telephone Encounter (Signed)
HST- [facility] well[employer] auth: [id number] (exp. 02/16/22 to 04/02/22) for CPT code 95806. Patient is scheduled at [facility] for 03/22/22 at 9 AM.

## 2022-02-28 ENCOUNTER — Telehealth: Payer: Self-pay | Admitting: Neurology

## 2022-03-01 ENCOUNTER — Telehealth: Payer: Self-pay | Admitting: Gastroenterology

## 2022-03-02 ENCOUNTER — Other Ambulatory Visit: Payer: Self-pay

## 2022-03-02 DIAGNOSIS — R195 Other fecal abnormalities: Secondary | ICD-10-CM

## 2022-03-02 MED ORDER — PLENVU 140 G PO SOLR: 3.0000 | Freq: Once | ORAL | 0 refills | Status: AC

## 2022-03-02 MED ORDER — PLENVU 140 G PO SOLR: 1.0000 | Freq: Once | ORAL | 0 refills | Status: DC

## 2022-03-02 NOTE — Telephone Encounter (Signed)
Spoke with Randy Sanchez and she confirmed that pt began holding Brilinta on Saturday 6/24. Prep sent to pt's pharmacy and colonoscopy prep instructions faxed to pharmacy. Pt's sister aware. Called pharmacy to make them aware pt would be picking up prep along with instructions that were faxed. Went over instructions with sister over the phone and pt's sister verbalized understanding.

## 2022-03-02 NOTE — Addendum Note (Signed)
Addended by: Marice Potter on: 03/02/2022 09:45 AM   Modules accepted: Orders

## 2022-03-02 NOTE — Telephone Encounter (Signed)
Attempted to fax instructions to pharmacy per pt's sister's request. Pt's sister was unable to pick up instructions at office today and they do not have mychart or an email address. Instructions faxed 3 times but was unable to get fax to go through. Received call from pharmacy requesting verbal instructions for pt. Gave pharmacist verbal instructions and pharmacist went over with pt's sister.

## 2022-03-03 ENCOUNTER — Encounter: Payer: Self-pay | Admitting: Gastroenterology

## 2022-03-03 ENCOUNTER — Telehealth: Payer: Self-pay

## 2022-03-03 ENCOUNTER — Ambulatory Visit (AMBULATORY_SURGERY_CENTER): Payer: Medicaid Other | Admitting: Gastroenterology

## 2022-03-03 ENCOUNTER — Telehealth: Payer: Self-pay | Admitting: Family Medicine

## 2022-03-03 VITALS — BP 114/73 | HR 85 | Temp 98.4°F | Resp 21 | Ht 65.0 in | Wt 146.0 lb

## 2022-03-03 DIAGNOSIS — I10 Essential (primary) hypertension: Secondary | ICD-10-CM | POA: Diagnosis not present

## 2022-03-03 DIAGNOSIS — E119 Type 2 diabetes mellitus without complications: Secondary | ICD-10-CM | POA: Diagnosis not present

## 2022-03-03 DIAGNOSIS — K640 First degree hemorrhoids: Secondary | ICD-10-CM

## 2022-03-03 DIAGNOSIS — D124 Benign neoplasm of descending colon: Secondary | ICD-10-CM

## 2022-03-03 DIAGNOSIS — R195 Other fecal abnormalities: Secondary | ICD-10-CM | POA: Diagnosis not present

## 2022-03-03 DIAGNOSIS — K5730 Diverticulosis of large intestine without perforation or abscess without bleeding: Secondary | ICD-10-CM | POA: Diagnosis not present

## 2022-03-03 DIAGNOSIS — D125 Benign neoplasm of sigmoid colon: Secondary | ICD-10-CM | POA: Diagnosis not present

## 2022-03-03 DIAGNOSIS — Z1211 Encounter for screening for malignant neoplasm of colon: Secondary | ICD-10-CM | POA: Diagnosis not present

## 2022-03-03 MED ORDER — NA SULFATE-K SULFATE-MG SULF 17.5-3.13-1.6 GM/177ML PO SOLN: 1.0000 | Freq: Once | ORAL | 0 refills | Status: AC

## 2022-03-03 MED ORDER — SODIUM CHLORIDE 0.9 % IV SOLN: 500.0000 mL | INTRAVENOUS | Status: DC

## 2022-03-03 NOTE — Op Note (Signed)
Randy Sanchez: Burrell Hodapp Procedure Date: 03/03/2022 1:23 PM MRN: [id number] Endoscopist: Gerrit Heck , MD Age: 66 Referring MD:  Date of Birth: [birth year] Gender: Male Account #: [id number] Procedure:                Colonoscopy Indications:              Screening for colorectal malignant neoplasm, This                            is the patient's first colonoscopy                           Incidental - Positive Cologuard test. No overt                            hematochezia or melena. Medicines:                Monitored Anesthesia Care Procedure:                Pre-Anesthesia Assessment:                           - Prior to the procedure, a History and Physical                            was performed, and patient medications and                            allergies were reviewed. The patient's tolerance of                            previous anesthesia was also reviewed. The risks                            and benefits of the procedure and the sedation                            options and risks were discussed with the patient.                            All questions were answered, and informed consent                            was obtained. Prior Anticoagulants: The patient has                            taken Brilinta, last dose was 5 days prior to                            procedure. ASA Grade Assessment: III - A patient                            with severe systemic disease. After reviewing the  risks and benefits, the patient was deemed in                            satisfactory condition to undergo the procedure.                           After obtaining informed consent, the colonoscope                            was passed under direct vision. Throughout the                            procedure, the patient's blood pressure, pulse, and                            oxygen saturations were monitored continuously. The                             Colonoscope was introduced through the anus and                            advanced to the the cecum, identified by                            appendiceal orifice and ileocecal valve. The                            colonoscopy was technically difficult and complex                            due to poor bowel prep. The patient tolerated the                            procedure well. The quality of the bowel                            preparation was poor. The ileocecal valve,                            appendiceal orifice, and rectum were photographed. Scope In: 1:28:50 PM Scope Out: 1:44:23 PM Scope Withdrawal Time: 0 hours 8 minutes 33 seconds  Total Procedure Duration: 0 hours 15 minutes 33 seconds  Findings:                 The perianal and digital rectal examinations were                            normal.                           A 4 mm polyp was found in the descending colon. The                            polyp was sessile. The polyp was removed with a  cold snare. Resection and retrieval were complete.                            Estimated blood loss was minimal.                           A few small-mouthed diverticula were found in the                            sigmoid colon.                           A localized area of moderately erythematous mucosa                            was found in the sigmoid colon, located 30-40 cm                            from the anal verge. This was located in an area of                            diverticulosis. Biopsies were taken with a cold                            forceps for histology. Estimated blood loss was                            minimal.                           Semi-liquid stool and solid debris was found                            scattered throughout the entire colon, precluding                            visualization. Lavage of the area was performed                             using copious amounts of tap water, resulting in                            incomplete clearance with fair visualization.                           Non-bleeding internal hemorrhoids were found during                            retroflexion. The hemorrhoids were small. Complications:            No immediate complications. Estimated Blood Loss:     Estimated blood loss was minimal. Impression:               - Preparation of the colon was poor.                           -  One 4 mm polyp in the descending colon, removed                            with a cold snare. Resected and retrieved.                           - Diverticulosis in the sigmoid colon.                           - Erythematous mucosa in the sigmoid colon.                            Biopsied to evaluate for segmental colitis vs                            ischemic colitis.                           - Stool in the entire examined colon.                           - Non-bleeding internal hemorrhoids. Recommendation:           - Patient has a contact number available for                            emergencies. The signs and symptoms of potential                            delayed complications were discussed with the                            patient. Return to normal activities tomorrow.                            Written discharge instructions were provided to the                            patient.                           - Resume previous diet.                           - Continue present medications.                           - Await pathology results.                           - Repeat colonoscopy at next available appointment                            (within 3 months) because the bowel preparation was  poor.                           - Recommend extended 2-day bowel preparation for                            repeat colonoscopy.                           - Ok to resume Designer, industrial/product.                            - Return to GI clinic PRN. Gerrit Heck, MD 03/03/2022 1:51:58 PM

## 2022-03-03 NOTE — Telephone Encounter (Signed)
Opened to enter prep instructions

## 2022-03-03 NOTE — Progress Notes (Signed)
Called to room to assist during endoscopic procedure.  Patient ID and intended procedure confirmed with present staff. Received instructions for my participation in the procedure from the performing physician.  

## 2022-03-03 NOTE — Telephone Encounter (Signed)
LM to RC  

## 2022-03-03 NOTE — Progress Notes (Signed)
To pacu, VSS. Report to [email or web address] 

## 2022-03-03 NOTE — Patient Instructions (Addendum)
Await pathology results.  Resume previous diet and medications.  Resume Brilinta tomorrow.  Return colonoscopy on Aug 1 at 3:30 - arrive at 2:30.   YOU HAD AN ENDOSCOPIC PROCEDURE TODAY AT [facility]:   Refer to the procedure report that was given to you for any specific questions about what was found during the examination.  If the procedure report does not answer your questions, please call your gastroenterologist to clarify.  If you requested that your care partner not be given the details of your procedure findings, then the procedure report has been included in a sealed envelope for you to review at your convenience later.  YOU SHOULD EXPECT: Some feelings of bloating in the abdomen. Passage of more gas than usual.  Walking can help get rid of the air that was put into your GI tract during the procedure and reduce the bloating. If you had a lower endoscopy (such as a colonoscopy or flexible sigmoidoscopy) you may notice spotting of blood in your stool or on the toilet paper. If you underwent a bowel prep for your procedure, you may not have a normal bowel movement for a few days.  Please Note:  You might notice some irritation and congestion in your nose or some drainage.  This is from the oxygen used during your procedure.  There is no need for concern and it should clear up in a day or so.  SYMPTOMS TO REPORT IMMEDIATELY:  Following lower endoscopy (colonoscopy or flexible sigmoidoscopy):  Excessive amounts of blood in the stool  Significant tenderness or worsening of abdominal pains  Swelling of the abdomen that is new, acute  Fever of 100F or higher   For urgent or emergent issues, a gastroenterologist can be reached at any hour by calling [phone]. Do not use MyChart messaging for urgent concerns.    DIET:  We do recommend a small meal at first, but then you may proceed to your regular diet.  Drink plenty of fluids but you should avoid alcoholic  beverages for 24 hours.  ACTIVITY:  You should plan to take it easy for the rest of today and you should NOT DRIVE or use heavy machinery until tomorrow (because of the sedation medicines used during the test).    FOLLOW UP: Our staff will call the number listed on your records the next business day following your procedure.  We will call around 7:15- 8:00 am to check on you and address any questions or concerns that you may have regarding the information given to you following your procedure. If we do not reach you, we will leave a message.  If you develop any symptoms (ie: fever, flu-like symptoms, shortness of breath, cough etc.) before then, please call [phone].  If you test positive for Covid 19 in the 2 weeks post procedure, please call and report this information to [place].    If any biopsies were taken you will be contacted by phone or by letter within the next 1-3 weeks.  Please call us at [phone] if you have not heard about the biopsies in 3 weeks.    SIGNATURES/CONFIDENTIALITY: You and/or your care partner have signed paperwork which will be entered into your electronic medical record.  These signatures attest to the fact that that the information above on your After Visit Summary has been reviewed and is understood.  Full responsibility of the confidentiality of this discharge information lies with you and/or your care-partner.

## 2022-03-03 NOTE — Progress Notes (Signed)
GASTROENTEROLOGY PROCEDURE H&P NOTE   Primary Care Physician: Sonny Masters, FNP    Reason for Procedure:  Positive Cologuard, Colon Cancer screening  Plan:    Colonoscopy  Patient is appropriate for endoscopic procedure(s) in the ambulatory ([facility]) setting.  The nature of the procedure, as well as the risks, benefits, and alternatives were carefully and thoroughly reviewed with the patient. Ample time for discussion and questions allowed. The patient understood, was satisfied, and agreed to proceed.     HPI: Randy Sanchez is a 66 y.o. male who presents for colonoscopy for evaluation of positive Cologuard study and Colon Cancer screening.  Cologuard positive in 11/2021.  No active GI symptoms or recent overt bleeding.    History of CAD.  Holding Brilinta for procedure today.  Past Medical History:  Diagnosis Date   Arthritis    Chronic headaches    Diabetes mellitus type II, non insulin dependent (HCC) 07/28/2021   HTN, goal below 130/80 07/28/2021   Hyperlipidemia associated with type 2 diabetes mellitus (HCC), goal LDL < 70 07/28/2021   Ischemic cardiomyopathy 07/28/2021   NSTEMI (non-ST elevated myocardial infarction) (HCC) 07/27/2021   Tobacco abuse 12/20/2021    Past Surgical History:  Procedure Laterality Date   CORONARY STENT INTERVENTION N/A 07/27/2021   Procedure: CORONARY STENT INTERVENTION;  Surgeon: Corky Crafts, MD;  Location: [facility] INVASIVE CV LAB;  Service: Cardiovascular;  Laterality: N/A;   LEFT HEART CATH AND CORONARY ANGIOGRAPHY N/A 07/27/2021   Procedure: LEFT HEART CATH AND CORONARY ANGIOGRAPHY;  Surgeon: Corky Crafts, MD;  Location: [facility] INVASIVE CV LAB;  Service: Cardiovascular;  Laterality: N/A;    Prior to Admission medications   Medication Sig Start Date End Date Taking? Authorizing Provider  aspirin EC 81 MG tablet Take 81 mg by mouth daily. Swallow whole.   Yes [provider]  atorvastatin (LIPITOR) 80 MG tablet Take 1  tablet (80 mg total) by mouth daily. 08/17/21  Yes Alver Sorrow, NP  carvedilol (COREG) 6.25 MG tablet Take 1 tablet (6.25 mg total) by mouth 2 (two) times daily with a meal. 08/17/21  Yes Alver Sorrow, NP  ezetimibe (ZETIA) 10 MG tablet Take 1 tablet (10 mg total) by mouth daily. 11/16/21 02/14/22 Yes Alver Sorrow, NP  isosorbide mononitrate (IMDUR) 30 MG 24 hr tablet TAKE ONE (1) TABLET BY MOUTH EVERY DAY 11/25/21  Yes Alver Sorrow, NP  JARDIANCE 10 MG TABS tablet TAKE ONE (1) TABLET BY MOUTH EVERY DAY 11/16/21  Yes Alver Sorrow, NP  sacubitril-valsartan (ENTRESTO) 49-51 MG Take 1 tablet by mouth 2 (two) times daily. 12/20/21  Yes Chilton Si, MD  Blood Pressure KIT CHECK YOUR BLOOD PRESSURE TWICE A DAY 12/20/21   Chilton Si, MD  diclofenac Sodium (VOLTAREN) 1 % GEL Apply 2 g topically 4 (four) times daily. 02/16/22   Sonny Masters, FNP  nitroGLYCERIN (NITROSTAT) 0.4 MG SL tablet Place 1 tablet (0.4 mg total) under the tongue every 5 (five) minutes x 3 doses as needed for chest pain. Patient not taking: Reported on 03/03/2022 08/17/21   Alver Sorrow, NP  spironolactone (ALDACTONE) 25 MG tablet Take 0.5 tablets (12.5 mg total) by mouth daily. 11/16/21 02/14/22  Alver Sorrow, NP  ticagrelor (BRILINTA) 90 MG TABS tablet Take 1 tablet (90 mg total) by mouth 2 (two) times daily. 08/17/21   Alver Sorrow, NP    Current Outpatient Medications  Medication Sig Dispense Refill   aspirin EC 81  MG tablet Take 81 mg by mouth daily. Swallow whole.     atorvastatin (LIPITOR) 80 MG tablet Take 1 tablet (80 mg total) by mouth daily. 30 tablet 11   carvedilol (COREG) 6.25 MG tablet Take 1 tablet (6.25 mg total) by mouth 2 (two) times daily with a meal. 60 tablet 6   ezetimibe (ZETIA) 10 MG tablet Take 1 tablet (10 mg total) by mouth daily. 90 tablet 3   isosorbide mononitrate (IMDUR) 30 MG 24 hr tablet TAKE ONE (1) TABLET BY MOUTH EVERY DAY 90 tablet 3   JARDIANCE 10  MG TABS tablet TAKE ONE (1) TABLET BY MOUTH EVERY DAY 30 tablet 3   sacubitril-valsartan (ENTRESTO) 49-51 MG Take 1 tablet by mouth 2 (two) times daily. 60 tablet 5   Blood Pressure KIT CHECK YOUR BLOOD PRESSURE TWICE A DAY 1 kit 0   diclofenac Sodium (VOLTAREN) 1 % GEL Apply 2 g topically 4 (four) times daily. 350 g 0   nitroGLYCERIN (NITROSTAT) 0.4 MG SL tablet Place 1 tablet (0.4 mg total) under the tongue every 5 (five) minutes x 3 doses as needed for chest pain. (Patient not taking: Reported on 03/03/2022) 25 tablet 12   spironolactone (ALDACTONE) 25 MG tablet Take 0.5 tablets (12.5 mg total) by mouth daily. 45 tablet 3   ticagrelor (BRILINTA) 90 MG TABS tablet Take 1 tablet (90 mg total) by mouth 2 (two) times daily. 60 tablet 11   Current Facility-Administered Medications  Medication Dose Route Frequency Provider Last Rate Last Admin   0.9 %  sodium chloride infusion  500 mL Intravenous Continuous Demarius Archila V, DO        Allergies as of 03/03/2022   (No Known Allergies)    Family History  Problem Relation Age of Onset   Stomach cancer Mother    Ovarian cancer Mother    Bursitis Mother    Arthritis Mother    Colitis Mother    Hypertension Mother    Stroke Father    Hypertension Father    Stroke Sister    Other Sister        died at birth   Hypertension Sister        died at birth   Other Sister    Stroke Sister    Hypertension Sister    Arthritis Sister    Anemia Sister    Hypertension Brother    Coronary artery disease Brother    Heart attack Brother    Alcoholism Brother    Arthritis Brother    Heart attack Paternal Grandmother    Heart attack Paternal Grandfather    Sleep apnea Neg Hx    Colon cancer Neg Hx    Rectal cancer Neg Hx     Social History   Socioeconomic History   Marital status: Widowed    Spouse name: Not on file   Number of children: 0   Years of education: Not on file   Highest education level: Not on file  Occupational History    Occupation: retired  Tobacco Use   Smoking status: Former    Packs/day: 0.25    Types: Cigarettes    Quit date: 01/31/2022    Years since quitting: 0.0   Smokeless tobacco: Never  Vaping Use   Vaping Use: Never used  Substance and Sexual Activity   Alcohol use: Yes    Alcohol/week: 2.0 standard drinks of alcohol    Types: 2 Shots of liquor per week    Comment: 10-15  shot of [employer] every other day   Drug use: Not Currently    Types: Cocaine, Marijuana    Comment: last week   Sexual activity: Not Currently  Other Topics Concern   Not on file  Social History Narrative   Not on file   Social Determinants of Health   Financial Resource Strain: Not on file  Food Insecurity: Not on file  Transportation Needs: Not on file  Physical Activity: Not on file  Stress: Not on file  Social Connections: Not on file  Intimate Partner Violence: Not on file    Physical Exam: Vital signs in last 24 hours: $RemoveB'@BP'QlhvCwxH$  133/70   Pulse 74   Temp 98.4 F (36.9 C)   Ht $R'5\' 5"'kC$  (1.651 m)   Wt 146 lb (66.2 kg)   SpO2 97%   BMI 24.30 kg/m  GEN: NAD EYE: Sclerae anicteric ENT: MMM CV: Non-tachycardic Pulm: CTA b/l GI: Soft, NT/ND NEURO:  Alert & Oriented x 3   Gerrit Heck, DO [facility]   03/03/2022 1:10 PM

## 2022-03-04 ENCOUNTER — Telehealth: Payer: Self-pay | Admitting: *Deleted

## 2022-03-04 NOTE — Telephone Encounter (Signed)
  Follow up Call-     03/03/2022   12:49 PM  Call back number  Post procedure Call Back phone  # [phone]  Permission to leave phone message Yes     Patient questions:  Do you have a fever, pain , or abdominal swelling? No. Pain Score  0 *  Have you tolerated food without any problems? Yes.    Have you been able to return to your normal activities? Yes.    Do you have any questions about your discharge instructions: Diet   No. Medications  No. Follow up visit  No.  Do you have questions or concerns about your Care? No.  Actions: * If pain score is 4 or above: No action needed, pain <4.

## 2022-03-04 NOTE — Telephone Encounter (Signed)
Error

## 2022-03-04 NOTE — Telephone Encounter (Signed)
Patient sister calls and I told her we needed the letter to see what information was needed and she is going to bring it Monday.

## 2022-03-05 DIAGNOSIS — Z419 Encounter for procedure for purposes other than remedying health state, unspecified: Secondary | ICD-10-CM | POA: Diagnosis not present

## 2022-03-15 DIAGNOSIS — H2513 Age-related nuclear cataract, bilateral: Secondary | ICD-10-CM | POA: Diagnosis not present

## 2022-03-15 DIAGNOSIS — H35033 Hypertensive retinopathy, bilateral: Secondary | ICD-10-CM | POA: Diagnosis not present

## 2022-03-15 DIAGNOSIS — H40013 Open angle with borderline findings, low risk, bilateral: Secondary | ICD-10-CM | POA: Diagnosis not present

## 2022-03-15 DIAGNOSIS — E119 Type 2 diabetes mellitus without complications: Secondary | ICD-10-CM | POA: Diagnosis not present

## 2022-03-15 DIAGNOSIS — H3562 Retinal hemorrhage, left eye: Secondary | ICD-10-CM | POA: Diagnosis not present

## 2022-03-15 LAB — HM DIABETES EYE EXAM

## 2022-03-22 ENCOUNTER — Encounter: Payer: Self-pay | Admitting: Gastroenterology

## 2022-03-22 ENCOUNTER — Ambulatory Visit: Payer: Medicaid Other | Admitting: Neurology

## 2022-03-22 DIAGNOSIS — G4733 Obstructive sleep apnea (adult) (pediatric): Secondary | ICD-10-CM | POA: Diagnosis not present

## 2022-03-22 DIAGNOSIS — I255 Ischemic cardiomyopathy: Secondary | ICD-10-CM

## 2022-03-22 DIAGNOSIS — R351 Nocturia: Secondary | ICD-10-CM

## 2022-03-22 DIAGNOSIS — I214 Non-ST elevation (NSTEMI) myocardial infarction: Secondary | ICD-10-CM

## 2022-03-22 DIAGNOSIS — Z820 Family history of epilepsy and other diseases of the nervous system: Secondary | ICD-10-CM

## 2022-03-22 DIAGNOSIS — G4731 Primary central sleep apnea: Secondary | ICD-10-CM

## 2022-03-22 DIAGNOSIS — R0683 Snoring: Secondary | ICD-10-CM

## 2022-03-22 DIAGNOSIS — R0681 Apnea, not elsewhere classified: Secondary | ICD-10-CM

## 2022-03-24 NOTE — Procedures (Signed)
     [facility]  HOME SLEEP TEST (Watch PAT) REPORT  STUDY DATE: 03/22/2022  DOB: [birth year]  MRN: [id number]  ORDERING CLINICIAN: Star Age, MD, PhD   REFERRING CLINICIAN: Baruch Gouty, FNP   CLINICAL INFORMATION/HISTORY: 66 year old right-handed gentleman with an underlying medical history of coronary artery disease with status post non-STEMI in November 2022, ischemic cardiomyopathy, hypertension, hyperlipidemia, diabetes and smoking, who reports snoring, excessive daytime somnolence and witnessed apneas.  Epworth sleepiness score: 7/24.  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  Snoring was detected, ranging from mild to loud.  Treatment with positive airway pressure is highly recommended. This will require - ideally - a full night PAP titration study for proper treatment settings, O2 monitoring and mask fitting.  Given the complex nature of his sleep disordered breathing, a proper titration study is recommended.  He may benefit from BiPAP as opposed to CPAP therapy.  Alternative treatment options are limited secondary to the  severity of the patient's sleep disordered breathing and the complex nature of his sleep apnea.  Please note, that untreated obstructive sleep apnea may carry additional perioperative morbidity. Patients with significant obstructive sleep apnea should receive perioperative PAP therapy and the surgeons and particularly the anesthesiologist should be informed of the diagnosis and the severity of the sleep disordered breathing. The patient should be cautioned not to drive, work at heights, or operate dangerous or heavy equipment when tired or sleepy. Review and reiteration of good sleep hygiene measures should be pursued with any patient. Other causes of the patient's symptoms, including circadian rhythm disturbances, an underlying mood disorder, medication effect and/or an underlying medical problem cannot be ruled out based on this test. Clinical correlation is recommended. The patient and his referring provider will be notified of the test results. The patient will be seen in follow up in sleep clinic at [facility].  I certify that I have reviewed the raw data recording prior to the issuance of this report in accordance with the standards of the American Academy of Sleep Medicine (AASM).  INTERPRETING PHYSICIAN:   Star Age, MD, PhD  Board Certified in Neurology and Sleep Medicine  [facility] [address], [place], [place] [address] [phone]

## 2022-03-24 NOTE — Addendum Note (Signed)
Addended by: Star Age on: 03/24/2022 04:18 PM   Modules accepted: Orders

## 2022-03-24 NOTE — Progress Notes (Signed)
See procedure note.

## 2022-03-28 ENCOUNTER — Telehealth: Payer: Self-pay

## 2022-03-28 ENCOUNTER — Telehealth: Payer: Self-pay | Admitting: *Deleted

## 2022-03-28 NOTE — Telephone Encounter (Signed)
-----   Message from Star Age, MD sent at 03/24/2022  4:18 PM EDT ----- Patient referred by PCP, seen by me on 01/10/22, patient had a HST on 03/22/22.    Please call and notify the patient that the recent home sleep test showed significant sleep apnea and I recommend that he return for a titration study in the sleep lab so we can treat him with a CPAP or similar machine.  I would recommend a laboratory attended sleep study to optimize treatment settings.  We will check with his insurance for authorization, I have placed an order in his chart and we will call him to schedule an overnight sleep study for treatment purposes.  Thanks,   Star Age, MD, PhD [facility])

## 2022-03-28 NOTE — Telephone Encounter (Signed)
I called the pt's sister and left a vm asking for a call back to review sleep study results.

## 2022-03-28 NOTE — Telephone Encounter (Signed)
-----   Message from Star Age, MD sent at 03/24/2022  4:18 PM EDT ----- Patient referred by PCP, seen by me on 01/10/22, patient had a HST on 03/22/22.    Please call and notify the patient that the recent home sleep test showed significant sleep apnea and I recommend that he return for a titration study in the sleep lab so we can treat him with a CPAP or similar machine.  I would recommend a laboratory attended sleep study to optimize treatment settings.  We will check with his insurance for authorization, I have placed an order in his chart and we will call him to schedule an overnight sleep study for treatment purposes.  Thanks,   Star Age, MD, PhD [facility])

## 2022-03-28 NOTE — Telephone Encounter (Signed)
Relayed results of HST to sister, Thayer Headings.  Recommend inlab titration study.  Sleep lab to authorize thru insurance then will call to discuss and schedule.  She verbalized understanding of plan.  Appreciated call back.

## 2022-03-29 ENCOUNTER — Telehealth: Payer: Self-pay | Admitting: Neurology

## 2022-03-29 NOTE — Telephone Encounter (Signed)
lvm w/janice on dpr to call back to schedule   [employer] auth: [id number] (exp. 03/29/22 to 06/27/22)

## 2022-03-29 NOTE — Telephone Encounter (Signed)
Patient sister Thayer Headings called me back- the patient is scheduled for 04/17/22 at 9 pm. Thayer Headings requested I mail the packet to her.

## 2022-04-03 ENCOUNTER — Encounter: Payer: Self-pay | Admitting: Certified Registered Nurse Anesthetist

## 2022-04-05 ENCOUNTER — Ambulatory Visit (AMBULATORY_SURGERY_CENTER): Payer: Medicaid Other | Admitting: Gastroenterology

## 2022-04-05 ENCOUNTER — Encounter: Payer: Self-pay | Admitting: Gastroenterology

## 2022-04-05 VITALS — BP 128/80 | HR 83 | Temp 98.6°F | Resp 20 | Ht 65.0 in | Wt 146.0 lb

## 2022-04-05 DIAGNOSIS — I252 Old myocardial infarction: Secondary | ICD-10-CM | POA: Diagnosis not present

## 2022-04-05 DIAGNOSIS — R195 Other fecal abnormalities: Secondary | ICD-10-CM | POA: Diagnosis not present

## 2022-04-05 DIAGNOSIS — K640 First degree hemorrhoids: Secondary | ICD-10-CM

## 2022-04-05 DIAGNOSIS — E119 Type 2 diabetes mellitus without complications: Secondary | ICD-10-CM | POA: Diagnosis not present

## 2022-04-05 DIAGNOSIS — K5730 Diverticulosis of large intestine without perforation or abscess without bleeding: Secondary | ICD-10-CM | POA: Diagnosis not present

## 2022-04-05 DIAGNOSIS — Z1211 Encounter for screening for malignant neoplasm of colon: Secondary | ICD-10-CM | POA: Diagnosis not present

## 2022-04-05 DIAGNOSIS — Z419 Encounter for procedure for purposes other than remedying health state, unspecified: Secondary | ICD-10-CM | POA: Diagnosis not present

## 2022-04-05 DIAGNOSIS — D124 Benign neoplasm of descending colon: Secondary | ICD-10-CM

## 2022-04-05 DIAGNOSIS — I2510 Atherosclerotic heart disease of native coronary artery without angina pectoris: Secondary | ICD-10-CM | POA: Diagnosis not present

## 2022-04-05 MED ORDER — SODIUM CHLORIDE 0.9 % IV SOLN: 500.0000 mL | Freq: Once | INTRAVENOUS | Status: DC

## 2022-04-05 NOTE — Progress Notes (Signed)
1541HR > 100 with esmolol 25 mg given IV, MD updated, vss

## 2022-04-05 NOTE — Progress Notes (Signed)
1540  Pt experienced laryngeal spasm with jaw thrust PPV performed. Patient experiencing nausea and vomiting.  Zofran 4 mg IV given. Robinul 0.2 mg IV given due large amount of secretions upon assessment.  MD made aware, vss

## 2022-04-05 NOTE — Progress Notes (Signed)
1607 Report given to PACU, vss

## 2022-04-05 NOTE — Progress Notes (Signed)
1550 Ephedrine 10 mg given IV due to low BP, MD updated.

## 2022-04-05 NOTE — Op Note (Signed)
[facility] Patient Name: Randy Sanchez Procedure Date: 04/05/2022 3:17 PM MRN: [id number] Endoscopist: Gerrit Heck , MD Age: 66 Referring MD:  Date of Birth: 21-Apr-1956 Gender: Male Account #: [id number] Procedure:                Colonoscopy Indications:              Screening for colorectal malignant neoplasm                           Cologuard positive in 11/2021.No active GI                            symptomsor recent overt bleeding.                           Colonoscopy on 03/03/2022 which was n/f 4 mm                            descending colon adenoma, sigmoid diverticulosis                            with 10 cm area of eythema in sigmoid (path                            benign), internal hemorrhoids. Due to suboptimal                            prep, recommended short interval repeat colonoscopy                            for completion of CRC screening. Returns today                            after completing extended 2-day prep. Medicines:                Monitored Anesthesia Care, Esmolol 40 mg, Zofran 4                            mg, Robinol 0.2 mg, Albuterol treatment Procedure:                Pre-Anesthesia Assessment:                           - Prior to the procedure, a History and Physical                            was performed, and patient medications and                            allergies were reviewed. The patient's tolerance of                            previous anesthesia was also reviewed. The risks  and benefits of the procedure and the sedation                            options and risks were discussed with the patient.                            All questions were answered, and informed consent                            was obtained. Prior Anticoagulants: The patient has                            taken Brilinta, last dose was 2 days prior to                            procedure. ASA Grade Assessment: III - A  patient                            with severe systemic disease. After reviewing the                            risks and benefits, the patient was deemed in                            satisfactory condition to undergo the procedure.                           After obtaining informed consent, the colonoscope                            was passed under direct vision. Throughout the                            procedure, the patient's blood pressure, pulse, and                            oxygen saturations were monitored continuously. The                            Olympus PCF-H190DL [id number]) Colonoscope was                            introduced through the anus and advanced to the the                            cecum, identified by appendiceal orifice and                            ileocecal valve. The colonoscopy was performed                            without difficulty. The patient tolerated the  procedure fairly well. He did have an episode of                            emesis that was quickly recongnized and treated                            with suctioning, Zofran IV, Robinol. The quality of                            the bowel preparation was good. The ileocecal                            valve, appendiceal orifice, and rectum were                            photographed. Scope In: 3:33:12 PM Scope Out: 3:47:39 PM Scope Withdrawal Time: 0 hours 7 minutes 20 seconds  Total Procedure Duration: 0 hours 14 minutes 27 seconds  Findings:                 The perianal and digital rectal examinations were                            normal.                           Multiple small and large-mouthed diverticula were                            found in the sigmoid colon, descending colon and                            transverse colon. Most pronounced in the sigmoid                            colon.                           A localized area of mildly erythematous  mucosa was                            found in the sigmoid colon, located 30-40 cm from                            the anal verge. This was located in an area of                            dense diverticulosis, with mild mucosal hypertrophy                            and acute angulation. This appeared the same as the                            previous colonoscopy and was previously biopsied  and benign.                           Non-bleeding internal hemorrhoids were found during                            retroflexion. The hemorrhoids were small.                           The exam was otherwise normal throughout the                            remainder of the colon. Complications:            No immediate complications. Estimated Blood Loss:     Estimated blood loss: none. Impression:               - Diverticulosis in the sigmoid colon, in the                            descending colon and in the transverse colon.                           - Erythematous mucosa in the sigmoid colon, located                            30-40 cm from the anal verge. This was located in                            an area of dense diverticulosis, with mild mucosal                            hypertrophy and acute angulation. This appeared the                            same as the previous colonoscopy and was previously                            biopsied and benign.                           - Non-bleeding internal hemorrhoids.                           - No specimens collected. Recommendation:           - Patient has a contact number available for                            emergencies. The signs and symptoms of potential                            delayed complications were discussed with the                            patient. Return to normal activities tomorrow.  Written discharge instructions were provided to the                            patient.                            - Resume previous diet.                           - Continue present medications.                           - Repeat colonoscopy in 7 years for ongoing polyp                            surveillance.                           - Return to GI office PRN. Gerrit Heck, MD 04/05/2022 3:55:56 PM

## 2022-04-05 NOTE — Progress Notes (Signed)
Called to room to draw meds for assistance.  See anesthesia note.

## 2022-04-05 NOTE — Patient Instructions (Signed)
Please read handouts provided. Continue present medications. Repeat colonoscopy in 7 years for screening. Return to GI office as needed.   YOU HAD AN ENDOSCOPIC PROCEDURE TODAY AT [facility]:   Refer to the procedure report that was given to you for any specific questions about what was found during the examination.  If the procedure report does not answer your questions, please call your gastroenterologist to clarify.  If you requested that your care partner not be given the details of your procedure findings, then the procedure report has been included in a sealed envelope for you to review at your convenience later.  YOU SHOULD EXPECT: Some feelings of bloating in the abdomen. Passage of more gas than usual.  Walking can help get rid of the air that was put into your GI tract during the procedure and reduce the bloating. If you had a lower endoscopy (such as a colonoscopy or flexible sigmoidoscopy) you may notice spotting of blood in your stool or on the toilet paper. If you underwent a bowel prep for your procedure, you may not have a normal bowel movement for a few days.  Please Note:  You might notice some irritation and congestion in your nose or some drainage.  This is from the oxygen used during your procedure.  There is no need for concern and it should clear up in a day or so.  SYMPTOMS TO REPORT IMMEDIATELY:  Following lower endoscopy (colonoscopy or flexible sigmoidoscopy):  Excessive amounts of blood in the stool  Significant tenderness or worsening of abdominal pains  Swelling of the abdomen that is new, acute  Fever of 100F or higher  For urgent or emergent issues, a gastroenterologist can be reached at any hour by calling [phone]. Do not use MyChart messaging for urgent concerns.    DIET:  We do recommend a small meal at first, but then you may proceed to your regular diet.  Drink plenty of fluids but you should avoid alcoholic beverages for 24  hours.  ACTIVITY:  You should plan to take it easy for the rest of today and you should NOT DRIVE or use heavy machinery until tomorrow (because of the sedation medicines used during the test).    FOLLOW UP: Our staff will call the number listed on your records the next business day following your procedure.  We will call around 7:15- 8:00 am to check on you and address any questions or concerns that you may have regarding the information given to you following your procedure. If we do not reach you, we will leave a message.  If you develop any symptoms (ie: fever, flu-like symptoms, shortness of breath, cough etc.) before then, please call [phone].  If you test positive for Covid 19 in the 2 weeks post procedure, please call and report this information to [place].    If any biopsies were taken you will be contacted by phone or by letter within the next 1-3 weeks.  Please call us at [phone] if you have not heard about the biopsies in 3 weeks.    SIGNATURES/CONFIDENTIALITY: You and/or your care partner have signed paperwork which will be entered into your electronic medical record.  These signatures attest to the fact that that the information above on your After Visit Summary has been reviewed and is understood.  Full responsibility of the confidentiality of this discharge information lies with you and/or your care-partner.

## 2022-04-05 NOTE — Progress Notes (Signed)
GASTROENTEROLOGY PROCEDURE H&P NOTE   Primary Care Physician: Baruch Gouty, FNP    Reason for Procedure:  Positive Cologuard, Colon Cancer screening  Plan:    Colonoscopy  Patient is appropriate for endoscopic procedure(s) in the ambulatory ([place]) setting.  The nature of the procedure, as well as the risks, benefits, and alternatives were carefully and thoroughly reviewed with the patient. Ample time for discussion and questions allowed. The patient understood, was satisfied, and agreed to proceed.     HPI: Randy Sanchez is a 66 y.o. male who presents for colonoscopy for evaluation of positive Cologuard study and Colon Cancer screening.  Cologuard positive in 11/2021.  No active GI symptoms or recent overt bleeding.    Attempted colonoscopy on 03/03/2022 which was n/f 4 mm descending colon adenoma, sigmoid diverticulosis with 10 cm area of eythema in sigmoid (path benign), internal hemorrhoids. Due to suboptimal prep, recommended short interval repeat colonoscopy for completion of CRC screening.    History of CAD.  Holding Brilinta for procedure today. Completed extended 2-day prep.   He did have episode of CP on 03/05/2022 and called EMS. Per patient, was evaluated and determined no need for hospital evaluation. No recurrence of pain.   12-lead ECG today unchanged from comparison study in 09/2021. Will proceed with colonoscopy as planned and message his Cardiology team.   Past Medical History:  Diagnosis Date   Arthritis    Chronic headaches    Diabetes mellitus type II, non insulin dependent ([place]) 07/28/2021   HTN, goal below 130/80 07/28/2021   Hyperlipidemia associated with type 2 diabetes mellitus ([place]), goal LDL < 70 07/28/2021   Ischemic cardiomyopathy 07/28/2021   NSTEMI (non-ST elevated myocardial infarction) ([place]) 07/27/2021   Tobacco abuse 12/20/2021    Past Surgical History:  Procedure Laterality Date   CORONARY STENT INTERVENTION N/A 07/27/2021   Procedure:  CORONARY STENT INTERVENTION;  Surgeon: Jettie Booze, MD;  Location: [facility];  Service: Cardiovascular;  Laterality: N/A;   LEFT HEART CATH AND CORONARY ANGIOGRAPHY N/A 07/27/2021   Procedure: LEFT HEART CATH AND CORONARY ANGIOGRAPHY;  Surgeon: Jettie Booze, MD;  Location: [facility];  Service: Cardiovascular;  Laterality: N/A;    Prior to Admission medications   Medication Sig Start Date End Date Taking? Authorizing Provider  aspirin EC 81 MG tablet Take 81 mg by mouth daily. Swallow whole.    [provider]  atorvastatin (LIPITOR) 80 MG tablet Take 1 tablet (80 mg total) by mouth daily. 08/17/21   Loel Dubonnet, NP  Blood Pressure KIT CHECK YOUR BLOOD PRESSURE TWICE A DAY 12/20/21   Skeet Latch, MD  carvedilol (COREG) 6.25 MG tablet Take 1 tablet (6.25 mg total) by mouth 2 (two) times daily with a meal. 08/17/21   Loel Dubonnet, NP  diclofenac Sodium (VOLTAREN) 1 % GEL Apply 2 g topically 4 (four) times daily. 02/16/22   Baruch Gouty, FNP  ezetimibe (ZETIA) 10 MG tablet Take 1 tablet (10 mg total) by mouth daily. 11/16/21 02/14/22  Loel Dubonnet, NP  isosorbide mononitrate (IMDUR) 30 MG 24 hr tablet TAKE ONE (1) TABLET BY MOUTH EVERY DAY 11/25/21   Loel Dubonnet, NP  JARDIANCE 10 MG TABS tablet TAKE ONE (1) TABLET BY MOUTH EVERY DAY 11/16/21   Loel Dubonnet, NP  nitroGLYCERIN (NITROSTAT) 0.4 MG SL tablet Place 1 tablet (0.4 mg total) under the tongue every 5 (five) minutes x 3 doses as needed for chest  pain. Patient not taking: Reported on 03/03/2022 08/17/21   Loel Dubonnet, NP  sacubitril-valsartan (ENTRESTO) 49-51 MG Take 1 tablet by mouth 2 (two) times daily. 12/20/21   Skeet Latch, MD  spironolactone (ALDACTONE) 25 MG tablet Take 0.5 tablets (12.5 mg total) by mouth daily. 11/16/21 02/14/22  Loel Dubonnet, NP  ticagrelor (BRILINTA) 90 MG TABS tablet Take 1 tablet (90 mg total) by mouth 2 (two) times daily. 08/17/21    Loel Dubonnet, NP    Current Outpatient Medications  Medication Sig Dispense Refill   aspirin EC 81 MG tablet Take 81 mg by mouth daily. Swallow whole.     atorvastatin (LIPITOR) 80 MG tablet Take 1 tablet (80 mg total) by mouth daily. 30 tablet 11   Blood Pressure KIT CHECK YOUR BLOOD PRESSURE TWICE A DAY 1 kit 0   carvedilol (COREG) 6.25 MG tablet Take 1 tablet (6.25 mg total) by mouth 2 (two) times daily with a meal. 60 tablet 6   diclofenac Sodium (VOLTAREN) 1 % GEL Apply 2 g topically 4 (four) times daily. 350 g 0   ezetimibe (ZETIA) 10 MG tablet Take 1 tablet (10 mg total) by mouth daily. 90 tablet 3   isosorbide mononitrate (IMDUR) 30 MG 24 hr tablet TAKE ONE (1) TABLET BY MOUTH EVERY DAY 90 tablet 3   JARDIANCE 10 MG TABS tablet TAKE ONE (1) TABLET BY MOUTH EVERY DAY 30 tablet 3   nitroGLYCERIN (NITROSTAT) 0.4 MG SL tablet Place 1 tablet (0.4 mg total) under the tongue every 5 (five) minutes x 3 doses as needed for chest pain. (Patient not taking: Reported on 03/03/2022) 25 tablet 12   sacubitril-valsartan (ENTRESTO) 49-51 MG Take 1 tablet by mouth 2 (two) times daily. 60 tablet 5   spironolactone (ALDACTONE) 25 MG tablet Take 0.5 tablets (12.5 mg total) by mouth daily. 45 tablet 3   ticagrelor (BRILINTA) 90 MG TABS tablet Take 1 tablet (90 mg total) by mouth 2 (two) times daily. 60 tablet 11   Current Facility-Administered Medications  Medication Dose Route Frequency Provider Last Rate Last Admin   0.9 %  sodium chloride infusion  500 mL Intravenous Once Arren Laminack V, DO        Allergies as of 04/05/2022   (No Known Allergies)    Family History  Problem Relation Age of Onset   Stomach cancer Mother    Ovarian cancer Mother    Bursitis Mother    Arthritis Mother    Colitis Mother    Hypertension Mother    Stroke Father    Hypertension Father    Stroke Sister    Other Sister        died at birth   Hypertension Sister        died at birth   Other Sister     Stroke Sister    Hypertension Sister    Arthritis Sister    Anemia Sister    Hypertension Brother    Coronary artery disease Brother    Heart attack Brother    Alcoholism Brother    Arthritis Brother    Heart attack Paternal Grandmother    Heart attack Paternal Grandfather    Sleep apnea Neg Hx    Colon cancer Neg Hx    Rectal cancer Neg Hx     Social History   Socioeconomic History   Marital status: Widowed    Spouse name: Not on file   Number of children: 0   Years of  education: Not on file   Highest education level: Not on file  Occupational History   Occupation: retired  Tobacco Use   Smoking status: Former    Packs/day: 0.25    Types: Cigarettes    Quit date: 01/31/2022    Years since quitting: 0.1   Smokeless tobacco: Never  Vaping Use   Vaping Use: Never used  Substance and Sexual Activity   Alcohol use: Yes    Alcohol/week: 2.0 standard drinks of alcohol    Types: 2 Shots of liquor per week    Comment: 10-15 shot of [employer] every other day   Drug use: Not Currently    Types: Cocaine, Marijuana    Comment: last week   Sexual activity: Not Currently  Other Topics Concern   Not on file  Social History Narrative   Not on file   Social Determinants of Health   Financial Resource Strain: Not on file  Food Insecurity: Not on file  Transportation Needs: Not on file  Physical Activity: Not on file  Stress: Not on file  Social Connections: Not on file  Intimate Partner Violence: Not on file    Physical Exam: Vital signs in last 24 hours: $RemoveB'@BP'bgvxxbwD$  (!) 143/72   Pulse 62   Temp 98.6 F (37 C)   Ht $R'5\' 5"'WX$  (1.651 m)   Wt 146 lb (66.2 kg)   SpO2 99%   BMI 24.30 kg/m  GEN: NAD EYE: Sclerae anicteric ENT: MMM CV: Non-tachycardic Pulm: CTA b/l GI: Soft, NT/ND NEURO:  Alert & Oriented x 3   Gerrit Heck, DO [facility]   04/05/2022 3:07 PM

## 2022-04-05 NOTE — Progress Notes (Signed)
Pt sleepy but awake, following commands. Denies chest pain and vss.  Spoke with nephew and told him that his uncle throw up during case but was doing ok.

## 2022-04-05 NOTE — Progress Notes (Signed)
1542 HR > 100 with esmolol 25 mg given IV, MD updated, vss

## 2022-04-06 ENCOUNTER — Other Ambulatory Visit: Payer: Self-pay | Admitting: Family

## 2022-04-06 ENCOUNTER — Telehealth: Payer: Self-pay

## 2022-04-06 NOTE — Telephone Encounter (Signed)
  Follow up Call-     04/05/2022    2:59 PM 03/03/2022   12:49 PM  Call back number  Post procedure Call Back phone  # [phone] sister Rennis Chris [phone]  Permission to leave phone message Yes Yes     Patient questions:  Do you have a fever, pain , or abdominal swelling? No. Pain Score  0 *  Have you tolerated food without any problems? Yes.    Have you been able to return to your normal activities? Yes.    Do you have any questions about your discharge instructions: Diet   No. Medications  No. Follow up visit  No.  Do you have questions or concerns about your Care? No.  Actions: * If pain score is 4 or above: No action needed, pain <4.  Patient sleeping when I called, information obtained from patient's sister, Randy Sanchez, who is on his list of individuals cleared to talk with.

## 2022-04-17 ENCOUNTER — Ambulatory Visit (INDEPENDENT_AMBULATORY_CARE_PROVIDER_SITE_OTHER): Payer: Medicaid Other | Admitting: Neurology

## 2022-04-17 DIAGNOSIS — G4733 Obstructive sleep apnea (adult) (pediatric): Secondary | ICD-10-CM | POA: Diagnosis not present

## 2022-04-17 DIAGNOSIS — R9431 Abnormal electrocardiogram [ECG] [EKG]: Secondary | ICD-10-CM

## 2022-04-17 DIAGNOSIS — I214 Non-ST elevation (NSTEMI) myocardial infarction: Secondary | ICD-10-CM

## 2022-04-17 DIAGNOSIS — Z820 Family history of epilepsy and other diseases of the nervous system: Secondary | ICD-10-CM

## 2022-04-17 DIAGNOSIS — G4731 Primary central sleep apnea: Secondary | ICD-10-CM

## 2022-04-17 DIAGNOSIS — I255 Ischemic cardiomyopathy: Secondary | ICD-10-CM

## 2022-04-17 DIAGNOSIS — R351 Nocturia: Secondary | ICD-10-CM

## 2022-04-27 NOTE — Addendum Note (Signed)
Addended by: Star Age on: 04/27/2022 06:56 PM   Modules accepted: Orders

## 2022-04-27 NOTE — Procedures (Signed)
[facility] PAP TITRATION INTERPRETATION REPORT   STUDY DATE: 04/17/2022      PATIENT NAME:  Randy Sanchez, Randy Sanchez         DATE OF BIRTH:  1955-10-19  PATIENT ID:  [id number]    TYPE OF STUDY:  CPAP  READING PHYSICIAN: Star Age, MD, PhD SCORING TECHNICIAN: Vita Barley Fields   INDICATIONS:  66 year old right-handed gentleman with an underlying medical history of coronary artery disease with status post non-STEMI in November 2022, ischemic cardiomyopathy, hypertension, hyperlipidemia, diabetes and smoking, who presents for a full night titration to treat his mixed sleep apnea. The Epworth Sleepiness Scale was 7 out of 24 (scores above or equal to 10 are suggestive of hypersomnolence). His home sleep test from 03/22/22 showed severe sleep disordered breathing with a highly elevated AHI of 64.5/h, and a significant central component with a central AHI of 23.9/h. O2 nadir was 81%. ADDITIONAL INFORMATION:  Height: 65.0 in Weight: 148 lb (BMI 24) Neck Size: 15.5 in Medications: Aspirin, Lipitor, Coreg, Zetia, Imdur, Jardiance, Nitrostat  DESCRIPTION: A sleep technologist was in attendance for the duration of the recording.  Data collection, scoring, video monitoring, and reporting were performed in compliance with the AASM Manual for the Scoring of Sleep and Associated Events; (Hypopnea is scored based on the criteria listed in Section VIII D. 1b in the AASM Manual V2.6 using a 4% oxygen desaturation rule or Hypopnea is scored based on the criteria listed in Section VIII D. 1a in the AASM Manual V2.6 using 3% oxygen desaturation and /or arousal rule).  A physician certified by the American Board of Sleep Medicine reviewed each epoch of the study.  FINDINGS:  Please refer to the attached summary for additional quantitative information. The patient was fitted with a small Vitera FFM. The patient was started on CPAP at 5 cm and titrated to 10 cm. He was noted to hwve central  apneas at a pressure of 7 cm. He was tried on BiPAP of 12/8 cm and 13/9 cm, but AHI was elevated and central apneas were more pronounced on bilevel PAP. His AHI was reduced to 1.94/hour on a pressure of 9cm with supine REM sleep achieved and O2 nadir of 89%.   SLEEP CONTINUITY AND SLEEP ARCHITECTURE:  Lights out was at 21:45: and lights on 05:32: (7.8 hours in bed). Total sleep time was 443.5 minutes (75.8% supine;  24.2% lateral;  0.0% prone, 23.1% REM sleep), with a high sleep efficiency at 95.1%. Sleep latency was decreased at 1.0 minutes.  REM sleep latency was normal at 80.5 minutes. Of the total sleep time, the percentage of stage N1 sleep was 3.7%, stage N2 sleep was 72.2%, which is increased, stage N3 sleep was 1.0%, and REM sleep was 23.1%, which is normal. There were 10 Stage R periods observed on this study night, 26 awakenings (i.e. transitions to Stage W from any sleep stage), and 94.0 total stage transitions. Wake after sleep onset (WASO) time accounted for 22 minutes.  AROUSAL: There were 72 arousals in total, for an arousal index of 9.7 arousals/hour.  Of these, 21 were identified as respiratory-related arousals (2.8 /hr), 0 were PLM-related arousals (0.0 /hr), and 73 were non-specific arousals (9.9 /hr)  RESPIRATORY MONITORING:  Based on CMS criteria (using a 4% oxygen desaturation rule for scoring hypopneas), there were 77 apneas (10 obstructive; 67 central; 0 mixed), and 18 hypopneas.  Apnea index was 10.4. Hypopnea index was 2.4. The apnea-hypopnea index was 12.9 overall (16.4 supine, 3.8  non-supine; 11.1 REM, 14.5 supine REM). There were 0 respiratory effort-related arousals (RERAs).  The RERA index was 0.0 events/hr. Total respiratory disturbance index (RDI) was 12.9 events/hr. RDI results showed: supine RDI  16.4 /hr; non-supine RDI 1.7 /hr; REM RDI 11.1 /hr, supine REM RDI 14.5 /hr.   Based on AASM criteria (using a 3% oxygen desaturation and /or arousal rule for scoring hypopneas),  there were 77 apneas (10 obstructive; 67 central; 0 mixed), and 19 hypopneas. Apnea index was 10.4. Hypopnea index was 2.6. The apnea-hypopnea index was 13.0 overall (16.6 supine, 3.8 non-supine; 11.1 REM, 14.5 supine REM). There were 0 respiratory effort-related arousals (RERAs).  The RERA index was 0.0 events/hr. Total respiratory disturbance index (RDI) was 13.0 events/hr. RDI results showed: supine RDI  16.6 /hr; non-supine RDI 1.7 /hr; REM RDI 11.1 /hr, supine REM RDI 14.5 /hr.  Respiratory events were associated with oxyhemoglobin desaturations (nadir during sleep 67%) from a mean of 97%). There were 0 occurrences of Cheyne Stokes breathing. LIMB MOVEMENTS: There were 0 periodic limb movements of sleep (0.0/hr), of which 0 (0.0/hr) were associated with an arousal. OXIMETRY: Total sleep time spent at, or below 88% was 0.4 minutes, or 0.1% of total sleep time. Snoring was classified as . BODY POSITION: Duration of total sleep and percent of total sleep in their respective position is as follows: supine 336 minutes (75.8%), non-supine 107.5 minutes (24.2%); right 67 minutes (15.2%), left 40 minutes (9.0%), and prone 00 minutes (0.0%). Total supine REM sleep time was 70 minutes (68.8% of total REM sleep).    EEG: With the limited montage recorded, no EEG abnormalities were observed.   CARDIAC: The electrocardiogram showed at times irregular findings, not clearly in keeping with Atrial fibrillation. Average heart rate during sleep was 78 bpm.  The maximum heart rate during sleep was 109 bpm. The maximum heart rate during recording was 113.    BEHAVIORAL: No significant parasomnia behavior noted.   IMPRESSION and DIAGNOSES:  Obstructive sleep apnea Central sleep apnea Non-specific abnormal EKG   RECOMMENDATIONS:  1. This study demonstrates improvement of the patient's mixed sleep disordered breathing with CPAP of 9 cm with an AHI of 1.9/hour with supine REM sleep achieved and O2 nadir of 89%. He  had no resolution of the central respiratory events with BiPAP of 12/8 cm or 13/9 cm. I will recommend home CPAP treatment at a pressure of 9 cm via small Vitera full face mask. The patient will be advised to be fully compliant with PAP therapy to improve sleep related symptoms and decrease long term cardiovascular risks. The patient should be reminded, that it may take up to 3 months to get fully used to using PAP with all planned sleep. The earlier full compliance is achieved, the better long term compliance tends to be. Please note that untreated obstructive sleep apnea may carry additional perioperative morbidity. Patients with significant obstructive sleep apnea should receive perioperative PAP therapy and the surgeons and particularly the anesthesiologist should be informed of the diagnosis and the severity of the sleep disordered breathing. 2. The study showed an irregular EKG intermittently. Clinical correlation is recommended; the patient is followed by cardiology may be feasible. 3. The patient should be cautioned not to drive, work at heights, or operate dangerous or heavy equipment when tired or sleepy. Review and reiteration of good sleep hygiene measures should be pursued with any patient. 4. The patient will be seen in follow-up in the sleep clinic at [facility] for discussion of the  test results, symptom and treatment compliance review, further management strategies, etc. The referring provider will be notified of the test results.   I certify that I have reviewed the entire raw data recording prior to the issuance of this report in accordance with the Standards of Accreditation of the American Academy of Sleep Medicine (AASM).   Star Age,  MD, PhD

## 2022-05-06 DIAGNOSIS — Z419 Encounter for procedure for purposes other than remedying health state, unspecified: Secondary | ICD-10-CM | POA: Diagnosis not present

## 2022-05-07 ENCOUNTER — Other Ambulatory Visit: Payer: Self-pay | Admitting: Family

## 2022-05-10 ENCOUNTER — Telehealth: Payer: Self-pay

## 2022-05-10 NOTE — Telephone Encounter (Signed)
-----   Message from Star Age, MD sent at 04/27/2022  6:56 PM EDT ----- Patient had a CPAP titration study on 04/17/22.  Please call and inform patient that I have entered an order for treatment with positive airway pressure (PAP) treatment for obstructive sleep apnea (OSA). He did well during the latest sleep study with CPAP. We will, therefore, arrange for a machine for home use through a DME (durable medical equipment) company of His choice; and I will see the patient back in follow-up in about 10 weeks. Please also explain to the patient that I will be looking out for compliance data, which can be downloaded from the machine (stored on an SD card, that is inserted in the machine) or via remote access through a modem, that is built into the machine. At the time of the followup appointment we will discuss sleep study results and how it is going with PAP treatment at home. Please advise patient to bring His machine at the time of the first FU visit, even though this is cumbersome. Bringing the machine for every visit after that will likely not be needed, but often helps for the first visit to troubleshoot if needed. Please re-enforce the importance of compliance with treatment and the need for [place] to monitor compliance data - often an insurance requirement and actually good feedback for the patient as far as how they are doing.  Also remind patient, that any interim PAP machine or mask issues should be first addressed with the DME company, as they can often help better with technical and mask fit issues. Please ask if patient has a preference regarding DME company.  Please also make sure, the patient has a follow-up appointment with me in about 10 weeks from the setup date, thanks. May see one of our nurse practitioners if needed for proper timing of the FU appointment.  Please fax or rout report to the referring provider. Thanks,   Star Age, MD, PhD [facility])

## 2022-05-10 NOTE — Telephone Encounter (Signed)
Pt sister is calling and returning a call from nurse. She is requesting a callback

## 2022-05-10 NOTE — Telephone Encounter (Signed)
I called pt's sister, Thayer Headings, per [facility]. I advised pt's sister that Dr. Rexene Alberts reviewed their sleep study results and found that pt did well with a CPAP during his latest sleep study. Dr. Rexene Alberts recommends that pt start a CPAP at home. I reviewed PAP compliance expectations with the pt's sister. Pt's sister is agreeable to pt starting an auto-PAP. I advised pt's sister that an order will be sent to a DME, [facility], and [facility] will call the pt within about one week after they file with the pt's insurance. [facility] will show the pt how to use the machine, fit for masks, and troubleshoot the auto-PAP if needed. A follow up appt was made for insurance purposes with Dr. Rexene Alberts on 09/19/22 at 9:45am. Pt's sister verbalized understanding to arrive 15 minutes early and bring their auto-PAP. A letter with all of this information in it will be mailed to the pt as a reminder. Patient's sister reports that patient's address is [address] [place], [place] [address]. Pt's sister verbalized understanding of results. Pt's sister had no questions at this time but was encouraged to call back if questions arise. I have sent the order to [facility] and have received confirmation that they have received the order.

## 2022-05-10 NOTE — Telephone Encounter (Signed)
Rx request sent to pharmacy.  

## 2022-05-10 NOTE — Telephone Encounter (Signed)
I called patient to discuss. No answer, left a message asking him to call me back. If patient calls back another day please route to POD 4. 

## 2022-05-20 ENCOUNTER — Ambulatory Visit (INDEPENDENT_AMBULATORY_CARE_PROVIDER_SITE_OTHER): Payer: Medicaid Other | Admitting: Family Medicine

## 2022-05-20 ENCOUNTER — Encounter: Payer: Self-pay | Admitting: Family Medicine

## 2022-05-20 VITALS — BP 153/81 | HR 70 | Temp 97.6°F | Ht 65.0 in | Wt 155.6 lb

## 2022-05-20 DIAGNOSIS — Z23 Encounter for immunization: Secondary | ICD-10-CM | POA: Diagnosis not present

## 2022-05-20 DIAGNOSIS — E1169 Type 2 diabetes mellitus with other specified complication: Secondary | ICD-10-CM | POA: Diagnosis not present

## 2022-05-20 LAB — BAYER DCA HB A1C WAIVED: HB A1C (BAYER DCA - WAIVED): 7.3 % — ABNORMAL HIGH (ref 4.8–5.6)

## 2022-05-20 MED ORDER — EMPAGLIFLOZIN 25 MG PO TABS: 25.0000 mg | ORAL_TABLET | Freq: Every day | ORAL | 6 refills | Status: DC

## 2022-05-20 NOTE — Progress Notes (Signed)
Subjective:  Patient ID: Randy Sanchez, male    DOB: [birth year], 66 y.o.   MRN: [id number]  Patient Care Team: Baruch Gouty, FNP as PCP - General (Family Medicine) Skeet Latch, MD as PCP - Cardiology (Cardiology)   Chief Complaint:  Medical Management of Chronic Issues   HPI: Randy Sanchez is a 66 y.o. male presenting on 05/20/2022 for Medical Management of Chronic Issues   1. Type 2 diabetes mellitus with other specified complication, without long-term current use of insulin (HCC) Pt presents for follow up evaluation of Type 2 diabetes mellitus.  Current symptoms include increase appetite and erectile dysfunction . Patient denies foot ulcerations, nausea, paresthesia of the feet, polydipsia, polyuria, visual disturbances, vomiting, and weight loss. Current diabetic medications include Jardiance 10 mg daily Compliant with meds - Yes Current monitoring regimen:  none, declines to check at home Known diabetic complications: impotence and cardiovascular disease Cardiovascular risk factors: advanced age (older than 24 for men, 35 for women), diabetes mellitus, dyslipidemia, hypertension, and male gender Eye exam current (within one year): yes Podiatry yearly?  Yes Weight trend: stable Current diet:  does not follow a diet, eats what he wants Current exercise: none Is He on ACE inhibitor or angiotensin II receptor blocker?  Yes, Entresto Is He on statin? Yes atorvastatin Is He on ASA 81 mg daily?  Yes     Relevant past medical, surgical, family, and social history reviewed and updated as indicated.  Allergies and medications reviewed and updated. Data reviewed: Chart in Epic.   Past Medical History:  Diagnosis Date   Arthritis    Chronic headaches    Diabetes mellitus type II, non insulin dependent ([place]) 07/28/2021   HTN, goal below 130/80 07/28/2021   Hyperlipidemia associated with type 2 diabetes mellitus ([place]), goal LDL < 70 07/28/2021   Ischemic cardiomyopathy  07/28/2021   NSTEMI (non-ST elevated myocardial infarction) ([place]) 07/27/2021   Tobacco abuse 12/20/2021    Past Surgical History:  Procedure Laterality Date   CORONARY STENT INTERVENTION N/A 07/27/2021   Procedure: CORONARY STENT INTERVENTION;  Surgeon: Jettie Booze, MD;  Location: [facility];  Service: Cardiovascular;  Laterality: N/A;   LEFT HEART CATH AND CORONARY ANGIOGRAPHY N/A 07/27/2021   Procedure: LEFT HEART CATH AND CORONARY ANGIOGRAPHY;  Surgeon: Jettie Booze, MD;  Location: [facility];  Service: Cardiovascular;  Laterality: N/A;    Social History   Socioeconomic History   Marital status: Widowed    Spouse name: Not on file   Number of children: 0   Years of education: Not on file   Highest education level: Not on file  Occupational History   Occupation: retired  Tobacco Use   Smoking status: Former    Packs/day: 0.25    Types: Cigarettes    Quit date: 01/31/2022    Years since quitting: 0.2   Smokeless tobacco: Never  Vaping Use   Vaping Use: Never used  Substance and Sexual Activity   Alcohol use: Yes    Alcohol/week: 2.0 standard drinks of alcohol    Types: 2 Shots of liquor per week    Comment: 10-15 shot of Barnabas Lister daniel every other day   Drug use: Not Currently    Types: Cocaine, Marijuana    Comment: last week   Sexual activity: Not Currently  Other Topics Concern   Not on file  Social History Narrative   Not on file   Social Determinants of Health   Financial  Resource Strain: Not on file  Food Insecurity: Not on file  Transportation Needs: Not on file  Physical Activity: Not on file  Stress: Not on file  Social Connections: Not on file  Intimate Partner Violence: Not on file    Outpatient Encounter Medications as of 05/20/2022  Medication Sig   empagliflozin (JARDIANCE) 25 MG TABS tablet Take 1 tablet (25 mg total) by mouth daily before breakfast.   aspirin EC 81 MG tablet Take 81 mg by mouth daily. Swallow  whole.   atorvastatin (LIPITOR) 80 MG tablet Take 1 tablet (80 mg total) by mouth daily.   Blood Pressure KIT CHECK YOUR BLOOD PRESSURE TWICE A DAY   carvedilol (COREG) 6.25 MG tablet TAKE 1 TABLET BY MOUTH TWICE DAILY WITH MEALS   diclofenac Sodium (VOLTAREN) 1 % GEL Apply 2 g topically 4 (four) times daily.   ezetimibe (ZETIA) 10 MG tablet Take 1 tablet (10 mg total) by mouth daily.   isosorbide mononitrate (IMDUR) 30 MG 24 hr tablet TAKE ONE (1) TABLET BY MOUTH EVERY DAY   nitroGLYCERIN (NITROSTAT) 0.4 MG SL tablet Place 1 tablet (0.4 mg total) under the tongue every 5 (five) minutes x 3 doses as needed for chest pain. (Patient not taking: Reported on 03/03/2022)   sacubitril-valsartan (ENTRESTO) 49-51 MG Take 1 tablet by mouth 2 (two) times daily.   spironolactone (ALDACTONE) 25 MG tablet Take 0.5 tablets (12.5 mg total) by mouth daily.   ticagrelor (BRILINTA) 90 MG TABS tablet Take 1 tablet (90 mg total) by mouth 2 (two) times daily.   [DISCONTINUED] JARDIANCE 10 MG TABS tablet TAKE ONE (1) TABLET BY MOUTH EVERY DAY   No facility-administered encounter medications on file as of 05/20/2022.    No Known Allergies  Review of Systems  Constitutional:  Negative for activity change, appetite change, chills, diaphoresis, fatigue, fever and unexpected weight change.  HENT: Negative.    Eyes: Negative.  Negative for photophobia and visual disturbance.  Respiratory:  Negative for cough, chest tightness and shortness of breath.   Cardiovascular:  Negative for chest pain, palpitations and leg swelling.  Gastrointestinal:  Negative for abdominal pain, blood in stool, constipation, diarrhea, nausea and vomiting.  Endocrine: Positive for polyphagia. Negative for heat intolerance, polydipsia and polyuria.  Genitourinary:  Negative for decreased urine volume, difficulty urinating, dysuria, frequency and urgency.       Hard time getting and sustaining an erection  Musculoskeletal:  Negative for  arthralgias and myalgias.  Skin: Negative.   Allergic/Immunologic: Negative.   Neurological:  Negative for dizziness, tremors, seizures, syncope, facial asymmetry, speech difficulty, weakness, light-headedness, numbness and headaches.  Hematological: Negative.   Psychiatric/Behavioral:  Negative for confusion, hallucinations, sleep disturbance and suicidal ideas.   All other systems reviewed and are negative.       Objective:  BP (!) 153/81   Pulse 70   Temp 97.6 F (36.4 C)   Ht 5' 5" (1.651 m)   Wt 155 lb 9.6 oz (70.6 kg)   SpO2 98%   BMI 25.89 kg/m    Wt Readings from Last 3 Encounters:  05/20/22 155 lb 9.6 oz (70.6 kg)  04/05/22 146 lb (66.2 kg)  03/03/22 146 lb (66.2 kg)    Physical Exam Vitals and nursing note reviewed.  Constitutional:      General: He is not in acute distress.    Appearance: Normal appearance. He is well-developed, well-groomed and normal weight. He is not ill-appearing, toxic-appearing or diaphoretic.  HENT:  Head: Normocephalic and atraumatic.     Jaw: There is normal jaw occlusion.     Right Ear: Hearing normal.     Left Ear: Hearing normal.     Nose: Nose normal.     Mouth/Throat:     Lips: Pink.     Mouth: Mucous membranes are moist.     Pharynx: Uvula midline.  Eyes:     General: Lids are normal.     Pupils: Pupils are equal, round, and reactive to light.  Neck:     Thyroid: No thyroid mass, thyromegaly or thyroid tenderness.     Vascular: No carotid bruit or JVD.     Trachea: Trachea and phonation normal.  Cardiovascular:     Rate and Rhythm: Normal rate and regular rhythm.     Chest Wall: PMI is not displaced.     Pulses: Normal pulses.     Heart sounds: Normal heart sounds. No murmur heard.    No friction rub. No gallop.  Pulmonary:     Effort: Pulmonary effort is normal. No respiratory distress.     Breath sounds: Normal breath sounds. No wheezing.  Abdominal:     General: Bowel sounds are normal. There is no abdominal  bruit.     Palpations: Abdomen is soft. There is no hepatomegaly or splenomegaly.  Musculoskeletal:     Cervical back: Normal range of motion and neck supple.     Right lower leg: No edema.     Left lower leg: No edema.  Lymphadenopathy:     Cervical: No cervical adenopathy.  Skin:    General: Skin is warm and dry.     Capillary Refill: Capillary refill takes less than 2 seconds.     Coloration: Skin is not cyanotic, jaundiced or pale.     Findings: No rash.  Neurological:     General: No focal deficit present.     Mental Status: He is alert and oriented to person, place, and time.     Sensory: Sensation is intact.     Motor: Motor function is intact.     Coordination: Coordination is intact.     Gait: Gait is intact.     Deep Tendon Reflexes: Reflexes are normal and symmetric.  Psychiatric:        Attention and Perception: Attention and perception normal.        Mood and Affect: Mood and affect normal.        Speech: Speech normal.        Behavior: Behavior normal. Behavior is cooperative.        Thought Content: Thought content normal.        Cognition and Memory: Cognition and memory normal.        Judgment: Judgment normal.     Results for orders placed or performed in visit on 03/15/22  HM DIABETES EYE EXAM  Result Value Ref Range   HM Diabetic Eye Exam No Retinopathy No Retinopathy       Pertinent labs & imaging results that were available during my care of the patient were reviewed by me and considered in my medical decision making.  Assessment & Plan:  [place] was seen today for medical management of chronic issues.  Diagnoses and all orders for this visit:  Type 2 diabetes mellitus with other specified complication, without long-term current use of insulin (HCC) A1C remains above 7 today. Will increase Jardiance to 25 mg daily. Pt aware of dietary and lifestyle changes that would also  be beneficial. Follow up in 3 months for reevaluation.  -     Bayer DCA Hb A1c  Waived -     empagliflozin (JARDIANCE) 25 MG TABS tablet; Take 1 tablet (25 mg total) by mouth daily before breakfast.  Influenza vaccine needed -     Flu Vaccine QUAD 6+ mos PF IM (Fluarix Quad PF)     Continue all other maintenance medications.  Follow up plan: Return in about 3 months (around 08/19/2022) for chronic follow up, all labs.   Continue healthy lifestyle choices, including diet (rich in fruits, vegetables, and lean proteins, and low in salt and simple carbohydrates) and exercise (at least 30 minutes of moderate physical activity daily).  Educational handout given for DM  The above assessment and management plan was discussed with the patient. The patient verbalized understanding of and has agreed to the management plan. Patient is aware to call the clinic if they develop any new symptoms or if symptoms persist or worsen. Patient is aware when to return to the clinic for a follow-up visit. Patient educated on when it is appropriate to go to the emergency department.   Monia Pouch, FNP-C [facility] [phone]

## 2022-05-20 NOTE — Patient Instructions (Addendum)

## 2022-05-24 ENCOUNTER — Telehealth: Payer: Self-pay | Admitting: *Deleted

## 2022-05-24 NOTE — Telephone Encounter (Signed)
Received fax from [facility] that  pt has refused to move forward with order and wants to cancel it.  I called his caregiver, Thayer Headings (sister) on [place]).  I asked her about this as well. She said he says he is not going to use it.  I explained that having OSA (untreated) can cause lung, cardiac, stroke problems (overall general health problems).  She said he aware of this but is still not wanting to use cpap.  I will let Dr. Rexene Alberts know.  If he changes his mind he can call.  She verbalized understanding.

## 2022-05-24 NOTE — Telephone Encounter (Signed)
Please offer FU appt, if they want to discuss further. We can consider a titration study. Given the mixed type of sleep apnea, an alternative treatment is really not possible, other than a machine.

## 2022-05-25 NOTE — Telephone Encounter (Signed)
I called the patient's sister Thayer Headings) back and discussed per Dr Rexene Alberts, that we would like to offer a follow-up appointment if they want to discuss further. Can consider a titration sleep study. I advised that since he has a mixed type of sleep apnea, there is not really an alternative treatment other than a machine. The pt's sister verbalized understanding. She said he has told her twice that he doesn't want anything to do with the machine. She will ask him again today and will relay message to him that untreated sleep apnea can cause medical problems such as heart problems, stroke, memory trouble, etc. She verbalized appreciation for the call.

## 2022-05-25 NOTE — Telephone Encounter (Signed)
Thank you for reaching out again.  We can therefore cancel the pending appointment for January 2024 for now.

## 2022-05-25 NOTE — Telephone Encounter (Signed)
Cancelled appt

## 2022-06-01 IMAGING — DX DG FOOT COMPLETE 3+V*L*
3 series · 3 of 3 positions shown · non-contrast
Comparison: None.

CLINICAL DATA: Left foot pain and swelling for 14 days. No history
of injury.

EXAM:
LEFT FOOT - COMPLETE 3+ VIEW

[foot ap]
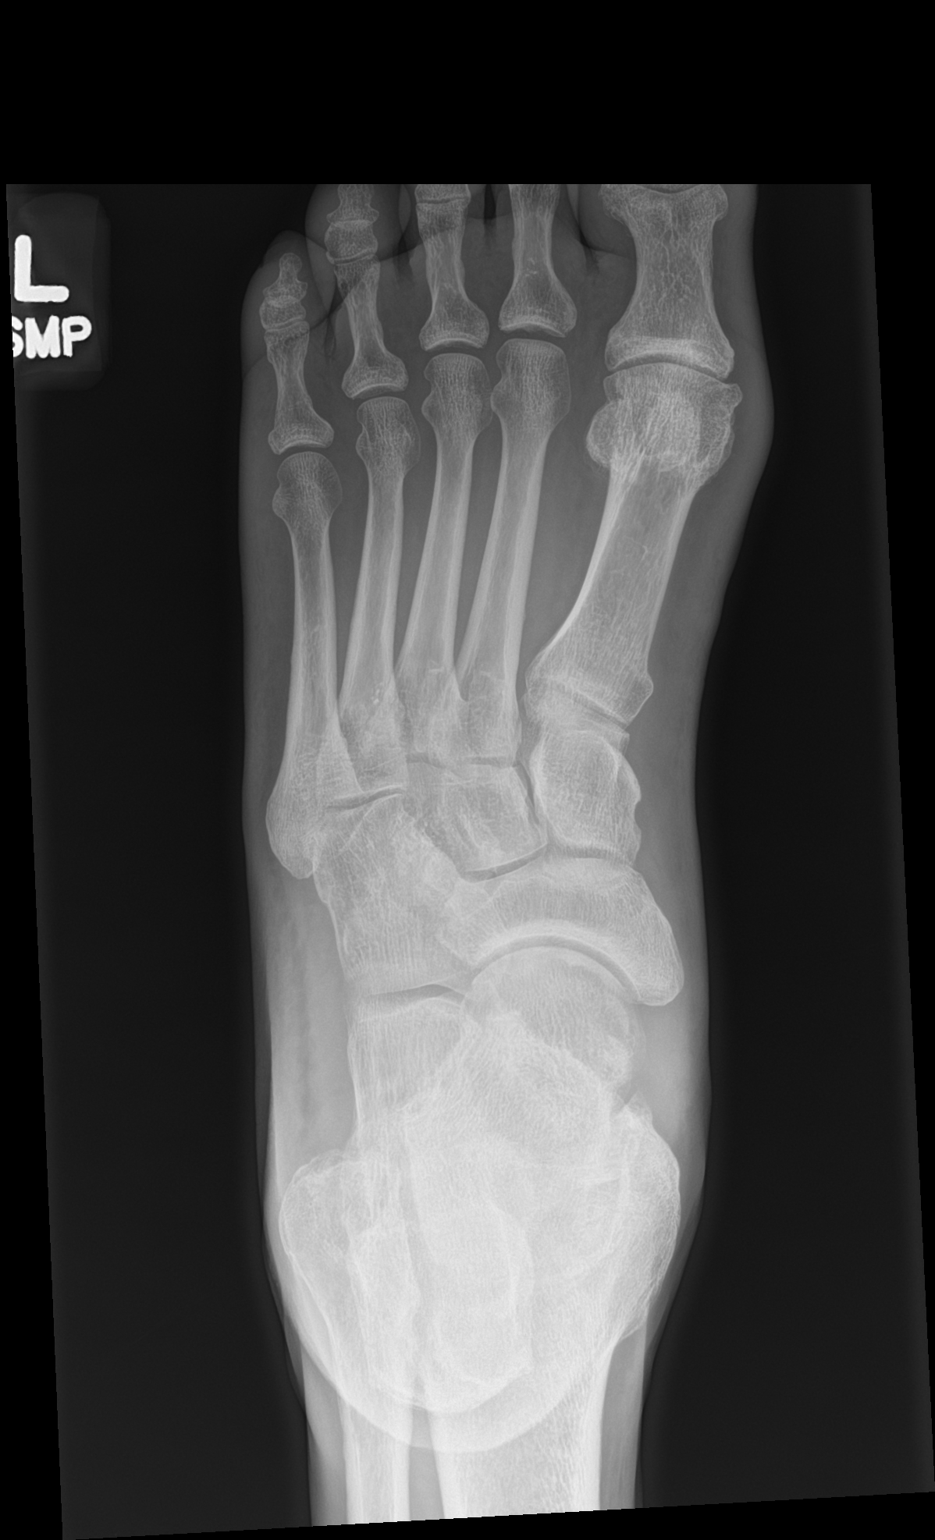

[foot obl]
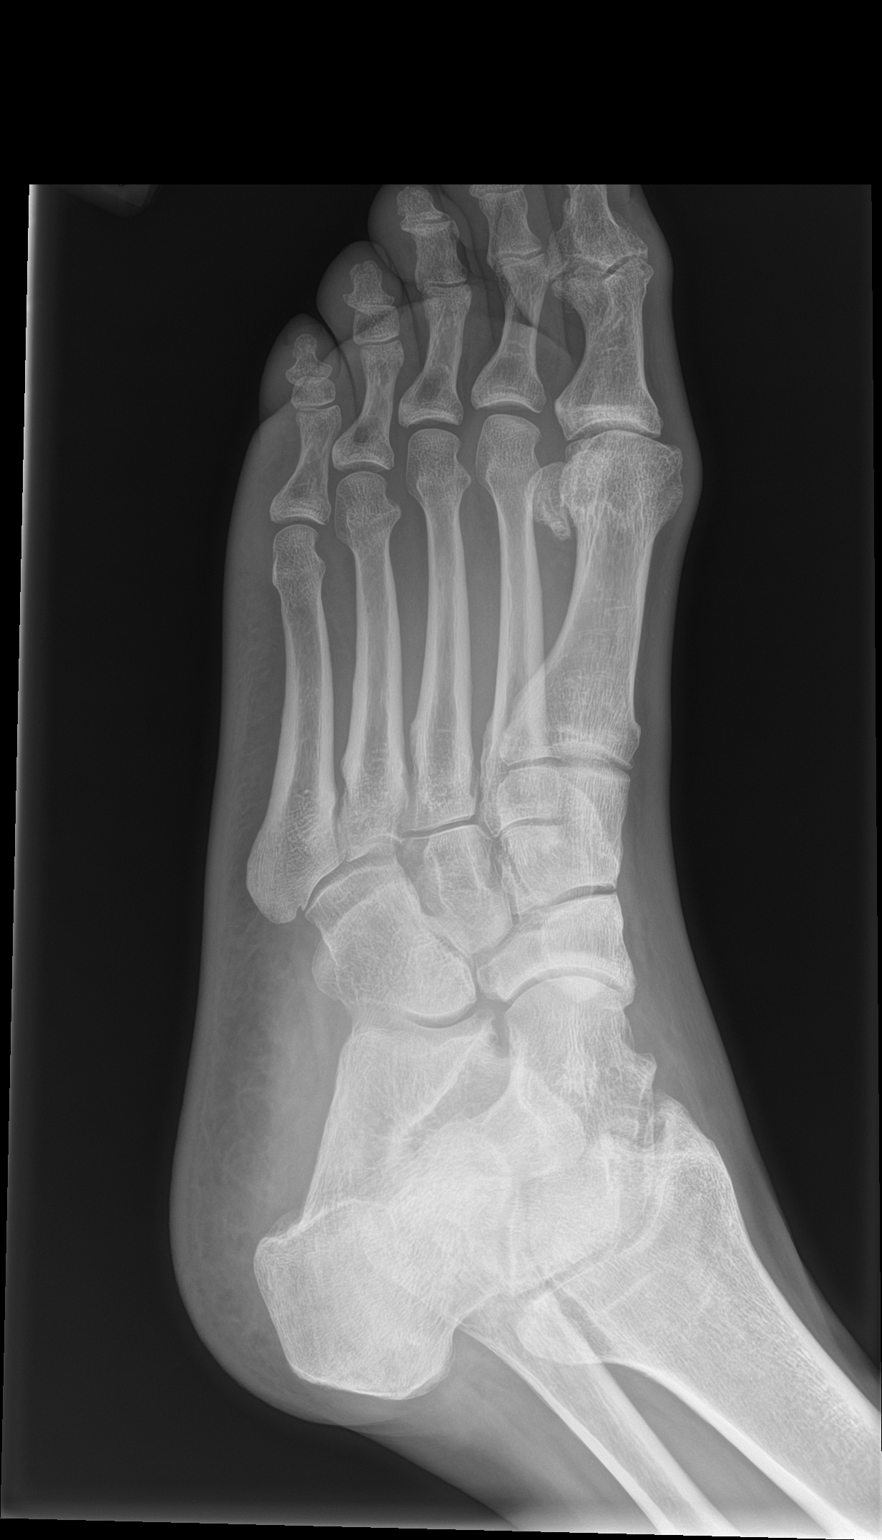

[foot lat]
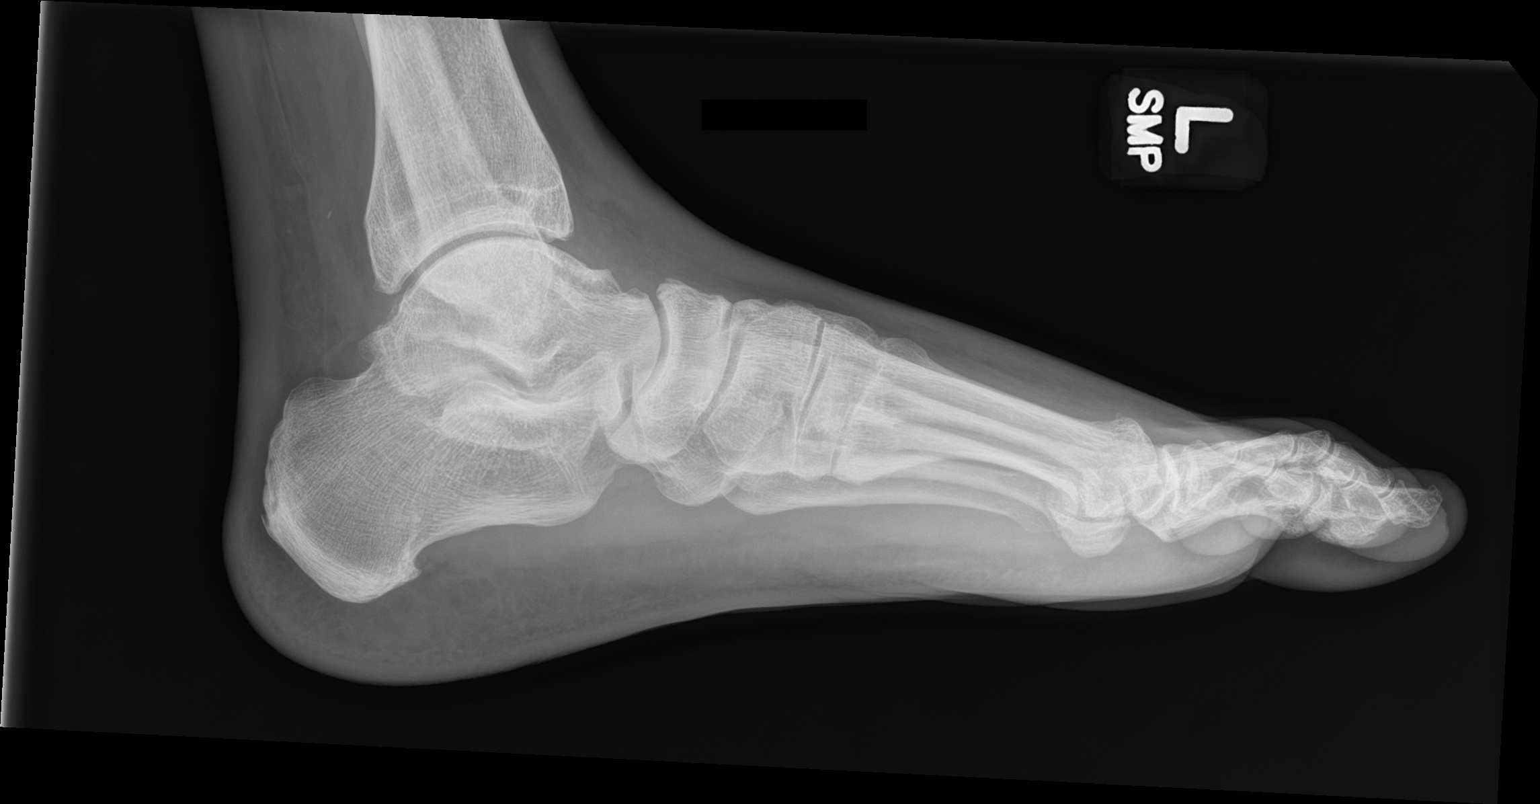

[3 of 3 positions shown; findings below may reference images not displayed]

FINDINGS: There is no evidence of fracture or dislocation. There is no
evidence of arthropathy or other focal bone abnormality. Soft
tissues are unremarkable.
IMPRESSION: Negative.

## 2022-06-04 ENCOUNTER — Other Ambulatory Visit: Payer: Self-pay | Admitting: Family Medicine

## 2022-06-04 ENCOUNTER — Other Ambulatory Visit (HOSPITAL_BASED_OUTPATIENT_CLINIC_OR_DEPARTMENT_OTHER): Payer: Self-pay | Admitting: Cardiovascular Disease

## 2022-06-04 DIAGNOSIS — M25521 Pain in right elbow: Secondary | ICD-10-CM

## 2022-06-05 DIAGNOSIS — Z419 Encounter for procedure for purposes other than remedying health state, unspecified: Secondary | ICD-10-CM | POA: Diagnosis not present

## 2022-06-06 NOTE — Telephone Encounter (Signed)
Rx(s) sent to pharmacy electronically.  

## 2022-06-13 DIAGNOSIS — H3562 Retinal hemorrhage, left eye: Secondary | ICD-10-CM | POA: Diagnosis not present

## 2022-06-20 NOTE — Progress Notes (Unsigned)
Virtual Visit via Telephone Note   Because of Randy Sanchez's co-morbid illnesses, he is at least at moderate risk for complications without adequate follow up.  This format is felt to be most appropriate for this patient at this time.  The patient did not have access to video technology/had technical difficulties with video requiring transitioning to audio format only (telephone).  All issues noted in this document were discussed and addressed.  No physical exam could be performed with this format.  Please refer to the patient's chart for his consent to telehealth for Randy Sanchez.    Date:  06/21/2022   ID:  Randy Sanchez, DOB 11/21/1955, MRN [id number] The patient was identified using 2 identifiers.  Patient Location: Home Provider Location: Office/Clinic   PCP:  Baruch Gouty, Randy Sanchez Providers Cardiologist:  Skeet Latch, MD     Evaluation Performed:  Follow-Up Visit  Chief Complaint:  Heart failure and CAD follow up  History of Present Illness:    Randy Sanchez is a 66 y.o. male with  coronary disease s/p DES to Cx 07/2021, hypertension, ICM, HFrEF, hyperlipidemia, tobacco use, diabetes, OSA (declines CPAP).   He presented to the ED 07/25/2021 with chest pain with troponin peak >24,000.  Cardiac catheterization showed multivessel disease with culprit lesion being OM1 treated with PTCA.  Also 80% circumflex treated with DES.  Echo during admission showed LVEF 40-45%.  He had not been on any medications since 2018.  Jardiance was started for diabetes.  Entresto, carvedilol started for HFrEF and HTN.  Aspirin, statin, Imdur, beta-blocker started for coronary disease. Repeat echocardiogram 11/2021 LVEF 40-45%. At visit 12/20/21 with Dr. Oval Linsey BP not at goal, [facility] increased.    Presents today for follow-up via phone visit. Tells me his right foot swelled up Friday and just went down yesterday. No known injury. Describes as "still got a little  hurt in it but no throbbing like it was". Tells me he took a Tylenol which helped. Walking for exercise one mile daily at a time and feels well doing it. No exertional dyspnea nor chest discomfort. Eating mostly at home and his sister brings him meals as well. Tells me he is not smoking.   Past Medical History:  Diagnosis Date   Arthritis    Chronic headaches    Diabetes mellitus type II, non insulin dependent (Randy Sanchez) 07/28/2021   HTN, goal below 130/80 07/28/2021   Hyperlipidemia associated with type 2 diabetes mellitus (Randy Sanchez), goal LDL < 70 07/28/2021   Ischemic cardiomyopathy 07/28/2021   NSTEMI (non-ST elevated myocardial infarction) (Randy Sanchez) 07/27/2021   Tobacco abuse 12/20/2021   Past Surgical History:  Procedure Laterality Date   CORONARY STENT INTERVENTION N/A 07/27/2021   Procedure: CORONARY STENT INTERVENTION;  Surgeon: Randy Booze, MD;  Location: Randy Sanchez CV LAB;  Service: Cardiovascular;  Laterality: N/A;   LEFT HEART CATH AND CORONARY ANGIOGRAPHY N/A 07/27/2021   Procedure: LEFT HEART CATH AND CORONARY ANGIOGRAPHY;  Surgeon: Randy Booze, MD;  Location: Randy Sanchez [facility] LAB;  Service: Cardiovascular;  Laterality: N/A;     Current Meds  Medication Sig   aspirin EC 81 MG tablet Take 81 mg by mouth daily. Swallow whole.   atorvastatin (LIPITOR) 80 MG tablet Take 1 tablet (80 mg total) by mouth daily.   Blood Pressure KIT CHECK YOUR BLOOD PRESSURE TWICE A DAY   carvedilol (COREG) 6.25 MG tablet TAKE 1 TABLET BY MOUTH TWICE DAILY WITH MEALS   diclofenac  Sodium (VOLTAREN) 1 % GEL APPLY 2 GRAMS 4 TIMES DAILY   empagliflozin (JARDIANCE) 25 MG TABS tablet Take 1 tablet (25 mg total) by mouth daily before breakfast.   ENTRESTO 49-51 MG TAKE ONE (1) TABLET BY MOUTH TWO (2) TIMES DAILY   isosorbide mononitrate (IMDUR) 30 MG 24 hr tablet TAKE ONE (1) TABLET BY MOUTH EVERY DAY   nitroGLYCERIN (NITROSTAT) 0.4 MG SL tablet Place 1 tablet (0.4 mg total) under the tongue every 5  (five) minutes x 3 doses as needed for chest pain.   ticagrelor (BRILINTA) 90 MG TABS tablet Take 1 tablet (90 mg total) by mouth 2 (two) times daily.     Allergies:   Patient has no known allergies.   Social History   Tobacco Use   Smoking status: Former    Packs/day: 0.25    Types: Cigarettes    Quit date: 01/31/2022    Years since quitting: 0.3   Smokeless tobacco: Never  Vaping Use   Vaping Use: Never used  Substance Use Topics   Alcohol use: Yes    Alcohol/week: 2.0 standard drinks of alcohol    Types: 2 Shots of liquor per week    Comment: 10-15 shot of Barnabas Lister daniel every other day   Drug use: Not Currently    Types: Cocaine, Marijuana    Comment: last week     Family Hx: The patient's family history includes Alcoholism in his brother; Anemia in his sister; Arthritis in his brother, mother, and sister; Bursitis in his mother; Colitis in his mother; Coronary artery disease in his brother; Heart attack in his brother, paternal grandfather, and paternal grandmother; Hypertension in his brother, father, mother, sister, and sister; Other in his sister and sister; Ovarian cancer in his mother; Stomach cancer in his mother; Stroke in his father, sister, and sister. There is no history of Sleep apnea, Colon cancer, or Rectal cancer.  ROS:   Please see the history of present illness.     All other systems reviewed and are negative.   Prior CV studies:   The following studies were reviewed today: Echo 11/15/2021:  1. Left ventricular ejection fraction, by estimation, is 40 to 45%. The  left ventricle has mildly decreased function. The left ventricle  demonstrates regional wall motion abnormalities (see scoring  diagram/findings for description). The mid-to-distal  anterolateral and inferolateral LV segments are hypokinetic. There is mild  concentric left ventricular hypertrophy. Left ventricular diastolic  parameters are consistent with Grade I diastolic dysfunction (impaired   relaxation).   2. Right ventricular systolic function is normal. The right ventricular  size is normal. Tricuspid regurgitation signal is inadequate for assessing  PA pressure.   3. The pericardial effusion is posterior to the left ventricle.   4. The mitral valve is normal in structure. Mild mitral valve  regurgitation.   5. The aortic valve is tricuspid. There is mild calcification of the  aortic valve. There is mild thickening of the aortic valve. Aortic valve  regurgitation is not visualized. Aortic valve sclerosis/calcification is  present, without any evidence of  aortic stenosis.   6. The inferior vena cava is normal in size with greater than 50%  respiratory variability, suggesting right atrial pressure of 3 mmHg.   Comparison(s): Compared to prior study on 07/2021, there is no significant  change.   ECHO: 07/27/2021  1. Left ventricular ejection fraction, by estimation, is 40 to 45%. The  left ventricle has mildly decreased function. The left ventricle  demonstrates  regional wall motion abnormalities with inferolateral and mid anterolateral hypokinesis. There is mild left ventricular hypertrophy. Left ventricular diastolic parameters are consistent with Grade I diastolic dysfunction (impaired relaxation).   2. Right ventricular systolic function is normal. The right ventricular  size is normal. Tricuspid regurgitation signal is inadequate for assessing PA pressure.   3. The mitral valve is normal in structure. No evidence of mitral valve regurgitation. No evidence of mitral stenosis.   4. The aortic valve is tricuspid. Aortic valve regurgitation is not  visualized. No aortic stenosis is present.   5. The inferior vena cava is normal in size with greater than 50%  respiratory variability, suggesting right atrial pressure of 3 mmHg.    CATH: 07/27/2021   1st Mrg-1 lesion is 100% stenosed.  Releatively small vessel. Culprit for MI.  Balloon angioplasty was performed using a  BALLN SAPPHIRE 2.0X15.   Post intervention, there is a 25% residual stenosis.   1st Mrg-2 lesion is 75% stenosed.  Balloon angioplasty was performed using a BALLN SAPPHIRE 2.0X15.   Post intervention, there is a 25% residual stenosis.   Ost LAD to Prox LAD lesion is 25% stenosed.   Mid Cx lesion is 80% stenosed.   A drug-eluting stent was successfully placed using a STENT ONYX FRONTIER 3.0X15, postdilated to 3.25 mm.   Post intervention, there is a 0% residual stenosis.   2nd Diag lesion is 80% stenosed.   RPDA-2 lesion is 50% stenosed.   RPDA-1 lesion is 50% stenosed.   Prox RCA lesion is 25% stenosed.   There is mild to moderate left ventricular systolic dysfunction.   LV end diastolic pressure is low.   The left ventricular ejection fraction is 35-45% by visual estimate.   There is no aortic valve stenosis.   I stressed the importance of dual antiplatelet therapy.  He will need aggressive secondary prevention including avoidance of any illicit substances, or tobacco.  He will need aggressive blood pressure control.  High-dose statin.  Diagnostic Dominance: Right Intervention     Labs/Other Tests and Data Reviewed:    EKG:  No ECG reviewed.  Recent Labs: 09/18/2021: B Natriuretic Peptide 272.0 11/16/2021: ALT 21; Hemoglobin 15.0; Platelets 421; TSH 3.150 12/30/2021: BUN 12; Creatinine, Ser 1.24; Potassium 4.3; Sodium 144   Recent Lipid Panel Lab Results  Component Value Date/Time   CHOL 135 11/25/2021 12:08 PM   TRIG 123 11/25/2021 12:08 PM   HDL 54 11/25/2021 12:08 PM   CHOLHDL 2.5 11/25/2021 12:08 PM   CHOLHDL 5.2 07/27/2021 06:46 AM   LDLCALC 59 11/25/2021 12:08 PM    Wt Readings from Last 3 Encounters:  05/20/22 155 lb 9.6 oz (70.6 kg)  04/05/22 146 lb (66.2 kg)  03/03/22 146 lb (66.2 kg)     Objective:    Vital Signs:  Ht $R'5\' 5"'WH$  (1.651 m)   BMI 25.89 kg/m    VITAL SIGNS:  reviewed  ASSESSMENT & PLAN:    CAD s/p DES Cx 07/2021 / HLD, LDL goal <70 / ED -  Recommended for DAPT x 1 year from DES. May stop Brilinta 07/28/22. 11/2021 LDL 59. Continue GDMT aspirin, Brilinta, Coreg, Jardiance. Will discontinue Imdur due to ED. If chest pain or SOB recurs, plan to resume. If chest pain and dyspnea do not recur, could trial Sildenafil vs Tadalafil for ED.  DM2 - 05/20/22 A1c 7.3. Not at goal <70. Continue to follow with PCP.   HTN - unable to assess at this time as not checking  BP at home. Continue present antihypertensive regimen with exception of discontinuation of Imdur due to ED,detailed above.  ICM / HFrEF - 11/2021 LVEF 40-45%. Unable to determine volume status as phone only visit and no weights at home. No exertional dyspnea. Had swelling of one foot over the weekend that resolved with tylenol - likely gout vs musculoskeletal injury. Continue GDMT Coreg, Jardiance, Entresto, Spironolactone. Low sodium diet, fluid restriction <2L, and daily weights encouraged. Educated to contact our office for weight gain of 2 lbs overnight or 5 lbs in one week.   Tobacco use - Continue smoking cessation encouraged. Recommend utilization of 1800QUITNOW.      Time:   Today, I have spent 11 minutes with the patient with telehealth technology discussing the above problems.     Medication Adjustments/Labs and Tests Ordered: Current medicines are reviewed at length with the patient today.  Concerns regarding medicines are outlined above.   Tests Ordered: No orders of the defined types were placed in this encounter.   Medication Changes: No orders of the defined types were placed in this encounter.   Follow Up:  In Person in 6 month(s)  Signed, Loel Dubonnet, NP  06/21/2022 9:11 AM    [place]

## 2022-06-21 ENCOUNTER — Ambulatory Visit (INDEPENDENT_AMBULATORY_CARE_PROVIDER_SITE_OTHER): Payer: Medicaid Other | Admitting: Family

## 2022-06-21 ENCOUNTER — Encounter (HOSPITAL_BASED_OUTPATIENT_CLINIC_OR_DEPARTMENT_OTHER): Payer: Self-pay | Admitting: Family

## 2022-06-21 VITALS — Ht 65.0 in

## 2022-06-21 DIAGNOSIS — I25118 Atherosclerotic heart disease of native coronary artery with other forms of angina pectoris: Secondary | ICD-10-CM | POA: Diagnosis not present

## 2022-06-21 DIAGNOSIS — Z720 Tobacco use: Secondary | ICD-10-CM

## 2022-06-21 DIAGNOSIS — E785 Hyperlipidemia, unspecified: Secondary | ICD-10-CM | POA: Diagnosis not present

## 2022-06-21 DIAGNOSIS — I255 Ischemic cardiomyopathy: Secondary | ICD-10-CM | POA: Diagnosis not present

## 2022-06-21 DIAGNOSIS — I5022 Chronic systolic (congestive) heart failure: Secondary | ICD-10-CM

## 2022-06-21 MED ORDER — EZETIMIBE 10 MG PO TABS: 10.0000 mg | ORAL_TABLET | Freq: Every day | ORAL | 3 refills | Status: DC

## 2022-06-21 MED ORDER — SPIRONOLACTONE 25 MG PO TABS: 12.5000 mg | ORAL_TABLET | Freq: Every day | ORAL | 3 refills | Status: DC

## 2022-06-21 NOTE — Patient Instructions (Addendum)
Medication Instructions:  Your physician has recommended you make the following change in your medication:   STOP Isosorbide Mononitrate (Imdur)  *We will call you in one week to make sure you are not developing shortness of breath or chest pain  *We have stopped this medication to hopefully be able to start medications for erectile dysfunction  On 07/28/2022 you may stop Brilinta (Ticagrelor)  *If you need a refill on your cardiac medications before your next appointment, please call your pharmacy*   Lab Work/Testing/Procedures: None ordered today.   Follow-Up: At [facility], you and your health needs are our priority.  As part of our continuing mission to provide you with exceptional heart care, we have created designated Provider Care Teams.  These Care Teams include your primary Cardiologist (physician) and Advanced Practice Providers (APPs -  Physician Assistants and Nurse Practitioners) who all work together to provide you with the care you need, when you need it.  We recommend signing up for the patient portal called "MyChart".  Sign up information is provided on this After Visit Summary.  MyChart is used to connect with patients for Virtual Visits (Telemedicine).  Patients are able to view lab/test results, encounter notes, upcoming appointments, etc.  Non-urgent messages can be sent to your provider as well.   To learn more about what you can do with MyChart, go to [email or web address].    Your next appointment:   March 18th, 2024 at 11 AM with Dr. Oval Linsey at [facility] at [facility]   Other Instructions  Heart Healthy Diet Recommendations: A low-salt diet is recommended. Meats should be grilled, baked, or boiled. Avoid fried foods. Focus on lean protein sources like fish or chicken with vegetables and fruits. The American Heart Association is a Microbiologist!  American Heart Association Diet and Lifeystyle Recommendations   Exercise  recommendations: The American Heart Association recommends 150 minutes of moderate intensity exercise weekly. Try 30 minutes of moderate intensity exercise 4-5 times per week. This could include walking, jogging, or swimming.  Important Information About Sugar

## 2022-06-28 ENCOUNTER — Telehealth (HOSPITAL_BASED_OUTPATIENT_CLINIC_OR_DEPARTMENT_OTHER): Payer: Self-pay | Admitting: Cardiovascular Disease

## 2022-06-28 ENCOUNTER — Telehealth (HOSPITAL_BASED_OUTPATIENT_CLINIC_OR_DEPARTMENT_OTHER): Payer: Self-pay

## 2022-06-28 NOTE — Telephone Encounter (Signed)
Follow Up:    Patient's sister is returning Randy Sanchez's call.

## 2022-06-28 NOTE — Telephone Encounter (Signed)
Pt sister calling back

## 2022-06-28 NOTE — Telephone Encounter (Signed)
As no chest pain not on Imdur, no need to resume. He may trial Sildenafil '50mg'$  thirty minutes prior to intercourse for ED. Rx 10 tabs with 1 refill.   Loel Dubonnet, NP

## 2022-06-28 NOTE — Telephone Encounter (Addendum)
Left message for patient to call back   ----- Message from Gerald Stabs, RN sent at 06/21/2022  9:26 AM EDT ----- CHEST PAIN PRESSURE OR TIGHTNESS?

## 2022-06-28 NOTE — Telephone Encounter (Signed)
Left message for patient to call back  

## 2022-06-28 NOTE — Telephone Encounter (Signed)
Returned call to patient's sister, she states that his chest pain is much much better. Advised her that this is great news and if the chest pain returns or worsens he should give [place] a call.

## 2022-06-28 NOTE — Telephone Encounter (Signed)
See duplicate encounter.

## 2022-06-29 NOTE — Telephone Encounter (Signed)
Left message for patient to call back     "As no chest pain not on Imdur, no need to resume. He may trial Sildenafil '50mg'$  thirty minutes prior to intercourse for ED. Rx 10 tabs with 1 refill.    Loel Dubonnet, NP "

## 2022-07-01 MED ORDER — SILDENAFIL CITRATE 50 MG PO TABS: 50.0000 mg | ORAL_TABLET | Freq: Every day | ORAL | 1 refills | Status: DC | PRN

## 2022-07-01 NOTE — Telephone Encounter (Signed)
Returned call to patient and provided the following recommendations, patient verbalizes understanding and verified pharmacy to send RX.     ""As no chest pain not on Imdur, no need to resume. He may trial Sildenafil '50mg'$  thirty minutes prior to intercourse for ED. Rx 10 tabs with 1 refill.    Loel Dubonnet, NP "

## 2022-07-01 NOTE — Addendum Note (Signed)
Addended by: Gerald Stabs on: 07/01/2022 10:10 AM   Modules accepted: Orders

## 2022-07-06 DIAGNOSIS — Z419 Encounter for procedure for purposes other than remedying health state, unspecified: Secondary | ICD-10-CM | POA: Diagnosis not present

## 2022-08-02 ENCOUNTER — Ambulatory Visit (INDEPENDENT_AMBULATORY_CARE_PROVIDER_SITE_OTHER): Payer: Medicaid Other | Admitting: Family Medicine

## 2022-08-02 ENCOUNTER — Encounter: Payer: Self-pay | Admitting: Family Medicine

## 2022-08-02 DIAGNOSIS — R0781 Pleurodynia: Secondary | ICD-10-CM

## 2022-08-02 DIAGNOSIS — W19XXXA Unspecified fall, initial encounter: Secondary | ICD-10-CM | POA: Diagnosis not present

## 2022-08-02 NOTE — Progress Notes (Signed)
Telephone visit  Subjective: [id number] pain after fall PCP: Baruch Gouty, Randy Sanchez is a 66 y.o. male calls for telephone consult today. Patient provides verbal consent for consult held via phone.  Due to COVID-19 pandemic this visit was conducted virtually. This visit type was conducted due to national recommendations for restrictions regarding the COVID-19 Pandemic (e.g. social distancing, sheltering in place) in an effort to limit this patient's exposure and mitigate transmission in our community. All issues noted in this document were discussed and addressed.  A physical exam was not performed with this format.   Location of patient: home Location of provider: [facility] Others present for call: none  1. Rib pain Patient reports he fell 2 Fridays ago onto the ground frontwards.  He reports ongoing soreness. No ecchymosis.  Hurts slightly still to breathe in but breathing on.  Not short of breath.  No hemoptysis.  Does not appreciate any bony abnormalities but just has some ongoing soreness so wanted to get checked out   ROS: Per HPI  No Known Allergies Past Medical History:  Diagnosis Date   Arthritis    Chronic headaches    Diabetes mellitus type II, non insulin dependent ([place]) 07/28/2021   HTN, goal below 130/80 07/28/2021   Hyperlipidemia associated with type 2 diabetes mellitus ([place]), goal LDL < 70 07/28/2021   Ischemic cardiomyopathy 07/28/2021   NSTEMI (non-ST elevated myocardial infarction) ([place]) 07/27/2021   Tobacco abuse 12/20/2021    Current Outpatient Medications:    aspirin EC 81 MG tablet, Take 81 mg by mouth daily. Swallow whole., Disp: , Rfl:    atorvastatin (LIPITOR) 80 MG tablet, Take 1 tablet (80 mg total) by mouth daily., Disp: 30 tablet, Rfl: 11   Blood Pressure KIT, CHECK YOUR BLOOD PRESSURE TWICE A DAY, Disp: 1 kit, Rfl: 0   carvedilol (COREG) 6.25 MG tablet, TAKE 1 TABLET BY MOUTH TWICE DAILY WITH MEALS, Disp: 60 tablet, Rfl: 6   diclofenac  Sodium (VOLTAREN) 1 % GEL, APPLY 2 GRAMS 4 TIMES DAILY, Disp: 300 g, Rfl: 2   empagliflozin (JARDIANCE) 25 MG TABS tablet, Take 1 tablet (25 mg total) by mouth daily before breakfast., Disp: 30 tablet, Rfl: 6   ENTRESTO 49-51 MG, TAKE ONE (1) TABLET BY MOUTH TWO (2) TIMES DAILY, Disp: 60 tablet, Rfl: 5   ezetimibe (ZETIA) 10 MG tablet, Take 1 tablet (10 mg total) by mouth daily., Disp: 90 tablet, Rfl: 3   nitroGLYCERIN (NITROSTAT) 0.4 MG SL tablet, Place 1 tablet (0.4 mg total) under the tongue every 5 (five) minutes x 3 doses as needed for chest pain., Disp: 25 tablet, Rfl: 12   sildenafil (VIAGRA) 50 MG tablet, Take 1 tablet (50 mg total) by mouth daily as needed for erectile dysfunction., Disp: 10 tablet, Rfl: 1   spironolactone (ALDACTONE) 25 MG tablet, Take 0.5 tablets (12.5 mg total) by mouth daily., Disp: 45 tablet, Rfl: 3   ticagrelor (BRILINTA) 90 MG TABS tablet, Take 1 tablet (90 mg total) by mouth 2 (two) times daily., Disp: 60 tablet, Rfl: 11  Assessment/ Plan: 66 y.o. male   Fall, initial encounter - Plan: DG Ribs Bilateral W/Chest, CANCELED: DG Chest 2 View  Rib pain - Plan: DG Ribs Bilateral W/Chest, CANCELED: DG Chest 2 View  Suspect contusion but we will evaluate for any evidence of fracture.  Discussed use of heat and ice on the affected areas.  May use topical analgesic of choice but would avoid NSAIDs given heart history and use  of Brilinta.  X-ray has been ordered.  Uncertain if he will have a ride to have this done today versus later in the week.  We discussed red flag signs and symptoms warranting further evaluation in the ER and he voiced good understanding.  Start time: 11:55a End time: 12:00p  Total time spent on patient care (including telephone call/ virtual visit): 5 minutes  [place], [place] [phone]

## 2022-08-05 DIAGNOSIS — Z419 Encounter for procedure for purposes other than remedying health state, unspecified: Secondary | ICD-10-CM | POA: Diagnosis not present

## 2022-08-19 ENCOUNTER — Ambulatory Visit (INDEPENDENT_AMBULATORY_CARE_PROVIDER_SITE_OTHER): Payer: Medicaid Other | Admitting: Family Medicine

## 2022-08-19 ENCOUNTER — Encounter: Payer: Self-pay | Admitting: Family Medicine

## 2022-08-19 ENCOUNTER — Other Ambulatory Visit: Payer: Self-pay | Admitting: Family

## 2022-08-19 VITALS — BP 169/94 | HR 87 | Temp 98.2°F | Ht 65.0 in | Wt 156.8 lb

## 2022-08-19 DIAGNOSIS — E785 Hyperlipidemia, unspecified: Secondary | ICD-10-CM | POA: Diagnosis not present

## 2022-08-19 DIAGNOSIS — Z23 Encounter for immunization: Secondary | ICD-10-CM | POA: Diagnosis not present

## 2022-08-19 DIAGNOSIS — E1159 Type 2 diabetes mellitus with other circulatory complications: Secondary | ICD-10-CM | POA: Diagnosis not present

## 2022-08-19 DIAGNOSIS — E1169 Type 2 diabetes mellitus with other specified complication: Secondary | ICD-10-CM | POA: Diagnosis not present

## 2022-08-19 DIAGNOSIS — E559 Vitamin D deficiency, unspecified: Secondary | ICD-10-CM

## 2022-08-19 DIAGNOSIS — I152 Hypertension secondary to endocrine disorders: Secondary | ICD-10-CM | POA: Diagnosis not present

## 2022-08-19 DIAGNOSIS — Z125 Encounter for screening for malignant neoplasm of prostate: Secondary | ICD-10-CM | POA: Diagnosis not present

## 2022-08-19 LAB — BAYER DCA HB A1C WAIVED: HB A1C (BAYER DCA - WAIVED): 8.3 % — ABNORMAL HIGH (ref 4.8–5.6)

## 2022-08-19 MED ORDER — RYBELSUS 3 MG PO TABS: 3.0000 mg | ORAL_TABLET | Freq: Every day | ORAL | 6 refills | Status: DC

## 2022-08-19 NOTE — Patient Instructions (Addendum)

## 2022-08-19 NOTE — Progress Notes (Signed)
Subjective:  Patient ID: Randy Sanchez, male    DOB: [birth year], 66 y.o.   MRN: [id number]  Patient Care Team: Baruch Gouty, FNP as PCP - General (Family Medicine) Skeet Latch, MD as PCP - Cardiology (Cardiology)   Chief Complaint:  Medical Management of Chronic Issues (3 month follow up )   HPI: Randy Sanchez is a 66 y.o. male presenting on 08/19/2022 for Medical Management of Chronic Issues (3 month follow up )   1. Type 2 diabetes mellitus with other specified complication, without long-term current use of insulin (HCC) Has been taking medications as prescribed but does not follow a healthy diet. No regular exercise. Has not been checking blood sugars at home. Complains of polyuria and polyphagia.   2. Hypertension associated with type 2 diabetes mellitus ([place]) Reports he needs a refill on Entresto and will pick this up today as he has not taken it. Denies chest pain, leg swelling, palpitations, headaches, visual changes, weakness, or confusion.   3. Hyperlipidemia associated with type 2 diabetes mellitus ([place]), goal LDL < 70 Compliant with statin therapy without reported myalgias. Does not follow a diet or exercise routine.   4. Vitamin D deficiency On repletion therapy. Denies arthralgias, trouble walking, or recent fractures.      Relevant past medical, surgical, family, and social history reviewed and updated as indicated.  Allergies and medications reviewed and updated. Data reviewed: Chart in Epic.   Past Medical History:  Diagnosis Date   Arthritis    Chronic headaches    Diabetes mellitus type II, non insulin dependent ([place]) 07/28/2021   HTN, goal below 130/80 07/28/2021   Hyperlipidemia associated with type 2 diabetes mellitus ([place]), goal LDL < 70 07/28/2021   Ischemic cardiomyopathy 07/28/2021   NSTEMI (non-ST elevated myocardial infarction) ([place]) 07/27/2021   Tobacco abuse 12/20/2021    Past Surgical History:  Procedure Laterality Date    CORONARY STENT INTERVENTION N/A 07/27/2021   Procedure: CORONARY STENT INTERVENTION;  Surgeon: Jettie Booze, MD;  Location: [facility];  Service: Cardiovascular;  Laterality: N/A;   LEFT HEART CATH AND CORONARY ANGIOGRAPHY N/A 07/27/2021   Procedure: LEFT HEART CATH AND CORONARY ANGIOGRAPHY;  Surgeon: Jettie Booze, MD;  Location: [facility];  Service: Cardiovascular;  Laterality: N/A;    Social History   Socioeconomic History   Marital status: Widowed    Spouse name: Not on file   Number of children: 0   Years of education: Not on file   Highest education level: Not on file  Occupational History   Occupation: retired  Tobacco Use   Smoking status: Former    Packs/day: 0.25    Types: Cigarettes    Quit date: 01/31/2022    Years since quitting: 0.5   Smokeless tobacco: Never  Vaping Use   Vaping Use: Never used  Substance and Sexual Activity   Alcohol use: Yes    Alcohol/week: 2.0 standard drinks of alcohol    Types: 2 Shots of liquor per week    Comment: 10-15 shot of Barnabas Lister daniel every other day   Drug use: Not Currently    Types: Cocaine, Marijuana    Comment: last week   Sexual activity: Not Currently  Other Topics Concern   Not on file  Social History Narrative   Not on file   Social Determinants of Health   Financial Resource Strain: Not on file  Food Insecurity: Not on file  Transportation Needs: Not on file  Physical Activity: Not on file  Stress: Not on file  Social Connections: Not on file  Intimate Partner Violence: Not on file    Outpatient Encounter Medications as of 08/19/2022  Medication Sig   aspirin EC 81 MG tablet Take 81 mg by mouth daily. Swallow whole.   Blood Pressure KIT CHECK YOUR BLOOD PRESSURE TWICE A DAY   carvedilol (COREG) 6.25 MG tablet TAKE 1 TABLET BY MOUTH TWICE DAILY WITH MEALS   diclofenac Sodium (VOLTAREN) 1 % GEL APPLY 2 GRAMS 4 TIMES DAILY   empagliflozin (JARDIANCE) 25 MG TABS tablet Take 1  tablet (25 mg total) by mouth daily before breakfast.   ENTRESTO 49-51 MG TAKE ONE (1) TABLET BY MOUTH TWO (2) TIMES DAILY   ezetimibe (ZETIA) 10 MG tablet Take 1 tablet (10 mg total) by mouth daily.   nitroGLYCERIN (NITROSTAT) 0.4 MG SL tablet Place 1 tablet (0.4 mg total) under the tongue every 5 (five) minutes x 3 doses as needed for chest pain.   Semaglutide (RYBELSUS) 3 MG TABS Take 3 mg by mouth daily.   sildenafil (VIAGRA) 50 MG tablet Take 1 tablet (50 mg total) by mouth daily as needed for erectile dysfunction.   [DISCONTINUED] atorvastatin (LIPITOR) 80 MG tablet Take 1 tablet (80 mg total) by mouth daily.   [DISCONTINUED] spironolactone (ALDACTONE) 25 MG tablet Take 0.5 tablets (12.5 mg total) by mouth daily.   [DISCONTINUED] ticagrelor (BRILINTA) 90 MG TABS tablet Take 1 tablet (90 mg total) by mouth 2 (two) times daily.   No facility-administered encounter medications on file as of 08/19/2022.    No Known Allergies  Review of Systems  Constitutional:  Negative for activity change, appetite change, chills, diaphoresis, fatigue, fever and unexpected weight change.  HENT: Negative.    Eyes: Negative.  Negative for photophobia and visual disturbance.  Respiratory:  Negative for cough, chest tightness and shortness of breath.   Cardiovascular:  Negative for chest pain, palpitations and leg swelling.  Gastrointestinal:  Negative for abdominal pain, blood in stool, constipation, diarrhea, nausea and vomiting.  Endocrine: Positive for polyphagia and polyuria. Negative for polydipsia.  Genitourinary:  Negative for decreased urine volume, difficulty urinating, dysuria, frequency and urgency.  Musculoskeletal:  Negative for arthralgias and myalgias.  Skin: Negative.   Allergic/Immunologic: Negative.   Neurological:  Negative for dizziness, tremors, seizures, syncope, facial asymmetry, speech difficulty, weakness, light-headedness, numbness and headaches.  Hematological: Negative.    Psychiatric/Behavioral:  Negative for confusion, hallucinations, sleep disturbance and suicidal ideas.   All other systems reviewed and are negative.       Objective:  BP (!) 169/94   Pulse 87   Temp 98.2 F (36.8 C) (Temporal)   Ht _0  (1.651 m)   Wt 156 lb 12.8 oz (71.1 kg)   SpO2 97%   BMI 26.09 kg/m    Wt Readings from Last 3 Encounters:  08/19/22 156 lb 12.8 oz (71.1 kg)  05/20/22 155 lb 9.6 oz (70.6 kg)  04/05/22 146 lb (66.2 kg)    Physical Exam Vitals and nursing note reviewed.  Constitutional:      General: He is not in acute distress.    Appearance: Normal appearance. He is well-developed and well-groomed. He is not ill-appearing, toxic-appearing or diaphoretic.  HENT:     Head: Normocephalic and atraumatic.     Jaw: There is normal jaw occlusion.     Right Ear: Hearing normal.     Left Ear: Hearing normal.     Nose: Nose  normal.     Mouth/Throat:     Lips: Pink.     Mouth: Mucous membranes are moist.     Pharynx: Oropharynx is clear. Uvula midline.  Eyes:     General: Lids are normal.     Conjunctiva/sclera: Conjunctivae normal.     Pupils: Pupils are equal, round, and reactive to light.  Neck:     Thyroid: No thyroid mass, thyromegaly or thyroid tenderness.     Vascular: No carotid bruit or JVD.     Trachea: Trachea and phonation normal.  Cardiovascular:     Rate and Rhythm: Normal rate and regular rhythm.     Chest Wall: PMI is not displaced.     Pulses: Normal pulses.     Heart sounds: Normal heart sounds. No murmur heard.    No friction rub. No gallop.  Pulmonary:     Effort: Pulmonary effort is normal. No respiratory distress.     Breath sounds: Normal breath sounds. No wheezing.  Abdominal:     General: There is no abdominal bruit.     Palpations: There is no hepatomegaly or splenomegaly.  Musculoskeletal:        General: Normal range of motion.     Cervical back: Normal range of motion and neck supple.     Right lower leg: No edema.      Left lower leg: No edema.  Lymphadenopathy:     Cervical: No cervical adenopathy.  Skin:    General: Skin is warm and dry.     Capillary Refill: Capillary refill takes less than 2 seconds.     Coloration: Skin is not cyanotic, jaundiced or pale.     Findings: No rash.  Neurological:     General: No focal deficit present.     Mental Status: He is alert and oriented to person, place, and time.     Sensory: Sensation is intact.     Motor: Motor function is intact.     Coordination: Coordination is intact.     Gait: Gait is intact.     Deep Tendon Reflexes: Reflexes are normal and symmetric.  Psychiatric:        Attention and Perception: Attention and perception normal.        Mood and Affect: Mood and affect normal.        Speech: Speech normal.        Behavior: Behavior normal. Behavior is cooperative.        Thought Content: Thought content normal.        Cognition and Memory: Cognition and memory normal.        Judgment: Judgment normal.     Results for orders placed or performed in visit on 08/19/22  Bayer DCA Hb A1c Waived  Result Value Ref Range   HB A1C (BAYER DCA - WAIVED) 8.3 (H) 4.8 - 5.6 %       Pertinent labs & imaging results that were available during my care of the patient were reviewed by me and considered in my medical decision making.  Assessment & Plan:  Randy Sanchez was seen today for medical management of chronic issues.  Diagnoses and all orders for this visit:  Type 2 diabetes mellitus with other specified complication, without long-term current use of insulin ([place]) Not at goal, A1C 8.3. Pt does not wish to add injectables such as Ozempic. Agrees to adding Rybelsus. Start as prescribed. Diet and exercise encouraged. Other labs pending. Follow up in 3 months for reevaluation.  -  Lipid panel -     CBC with Differential/Platelet -     CMP14+EGFR -     Bayer DCA Hb A1c Waived -     Thyroid Panel With TSH -     Semaglutide (RYBELSUS) 3 MG TABS; Take 3  mg by mouth daily.  Hypertension associated with type 2 diabetes mellitus (HCC) BP not controlled. Changes were not made in regimen today. States out of entresto and will pick up today. Goal BP is 130/80. Pt aware to report any persistent high or low readings. DASH diet and exercise encouraged. Exercise at least 150 minutes per week and increase as tolerated. Goal BMI > 25. Stress management encouraged. Avoid nicotine and tobacco product use. Avoid excessive alcohol and NSAID's. Avoid more than 2000 mg of sodium daily. Medications as prescribed. Follow up as scheduled.  -     Lipid panel -     CBC with Differential/Platelet -     CMP14+EGFR  Hyperlipidemia associated with type 2 diabetes mellitus ([place]), goal LDL < 70 Diet encouraged - increase intake of fresh fruits and vegetables, increase intake of lean proteins. Bake, broil, or grill foods. Avoid fried, greasy, and fatty foods. Avoid fast foods. Increase intake of fiber-rich whole grains. Exercise encouraged - at least 150 minutes per week and advance as tolerated.  Goal BMI < 25. Continue medications as prescribed. Follow up in 3-6 months as discussed.  -     Lipid panel -     CMP14+EGFR -     Thyroid Panel With TSH  Vitamin D deficiency Labs pending. Continue repletion therapy. If indicated, will change repletion dosage. Eat foods rich in Vit D including milk, orange juice, yogurt with vitamin D added, salmon or mackerel, canned tuna fish, cereals with vitamin D added, and cod liver oil. Get out in the sun but make sure to wear at least SPF 30 sunscreen.  -     VITAMIN D 25 Hydroxy (Vit-D Deficiency, Fractures)  Screening for prostate cancer -     PSA, total and free  Need for Tdap vaccination -     Tdap vaccine greater than or equal to 7yo IM     Continue all other maintenance medications.  Follow up plan: Return in about 3 months (around 11/18/2022), or if symptoms worsen or fail to improve, for DM.   Continue healthy lifestyle  choices, including diet (rich in fruits, vegetables, and lean proteins, and low in salt and simple carbohydrates) and exercise (at least 30 minutes of moderate physical activity daily).  Educational handout given for DM  The above assessment and management plan was discussed with the patient. The patient verbalized understanding of and has agreed to the management plan. Patient is aware to call the clinic if they develop any new symptoms or if symptoms persist or worsen. Patient is aware when to return to the clinic for a follow-up visit. Patient educated on when it is appropriate to go to the emergency department.   Monia Pouch, FNP-C [facility] [phone]

## 2022-08-20 LAB — LIPID PANEL
Chol/HDL Ratio: 2.5 ratio (ref 0.0–5.0)
Cholesterol, Total: 149 mg/dL (ref 100–199)
HDL: 59 mg/dL (ref >39)
LDL Chol Calc (NIH): 69 mg/dL (ref 0–99)
Triglycerides: 120 mg/dL (ref 0–149)
VLDL Cholesterol Cal: 21 mg/dL (ref 5–40)

## 2022-08-20 LAB — CBC WITH DIFFERENTIAL/PLATELET
Basophils Absolute: 0.1 10*3/uL (ref 0.0–0.2)
Basos: 1 %
EOS (ABSOLUTE): 0.1 10*3/uL (ref 0.0–0.4)
Eos: 2 %
Hematocrit: 41.6 % (ref 37.5–51.0)
Hemoglobin: 13.9 g/dL (ref 13.0–17.7)
Immature Grans (Abs): 0 10*3/uL (ref 0.0–0.1)
Immature Granulocytes: 0 %
Lymphocytes Absolute: 1.5 10*3/uL (ref 0.7–3.1)
Lymphs: 19 %
MCH: 28.2 pg (ref 26.6–33.0)
MCHC: 33.4 g/dL (ref 31.5–35.7)
MCV: 84 fL (ref 79–97)
Monocytes Absolute: 0.6 10*3/uL (ref 0.1–0.9)
Monocytes: 7 %
Neutrophils Absolute: 5.7 10*3/uL (ref 1.4–7.0)
Neutrophils: 71 %
Platelets: 305 10*3/uL (ref 150–450)
RBC: 4.93 x10E6/uL (ref 4.14–5.80)
RDW: 13.1 % (ref 11.6–15.4)
WBC: 8 10*3/uL (ref 3.4–10.8)

## 2022-08-20 LAB — CMP14+EGFR
ALT: 19 IU/L (ref 0–44)
AST: 22 IU/L (ref 0–40)
Albumin/Globulin Ratio: 1.4 (ref 1.2–2.2)
Albumin: 4.2 g/dL (ref 3.9–4.9)
Alkaline Phosphatase: 90 IU/L (ref 44–121)
BUN/Creatinine Ratio: 7 — ABNORMAL LOW (ref 10–24)
BUN: 12 mg/dL (ref 8–27)
Bilirubin Total: 0.4 mg/dL (ref 0.0–1.2)
CO2: 24 mmol/L (ref 20–29)
Calcium: 9.5 mg/dL (ref 8.6–10.2)
Chloride: 104 mmol/L (ref 96–106)
Creatinine, Ser: 1.71 mg/dL — ABNORMAL HIGH (ref 0.76–1.27)
Globulin, Total: 3 g/dL (ref 1.5–4.5)
Glucose: 126 mg/dL — ABNORMAL HIGH (ref 70–99)
Potassium: 4.4 mmol/L (ref 3.5–5.2)
Sodium: 141 mmol/L (ref 134–144)
Total Protein: 7.2 g/dL (ref 6.0–8.5)
eGFR: 44 mL/min/{1.73_m2} — ABNORMAL LOW (ref >59)

## 2022-08-20 LAB — PSA, TOTAL AND FREE
PSA, Free Pct: 30 %
PSA, Free: 0.15 ng/mL
Prostate Specific Ag, Serum: 0.5 ng/mL (ref 0.0–4.0)

## 2022-08-20 LAB — THYROID PANEL WITH TSH
Free Thyroxine Index: 2.3 (ref 1.2–4.9)
T3 Uptake Ratio: 29 % (ref 24–39)
T4, Total: 8 ug/dL (ref 4.5–12.0)
TSH: 2.33 u[IU]/mL (ref 0.450–4.500)

## 2022-08-20 LAB — VITAMIN D 25 HYDROXY (VIT D DEFICIENCY, FRACTURES): Vit D, 25-Hydroxy: 24.7 ng/mL — ABNORMAL LOW (ref 30.0–100.0)

## 2022-08-22 ENCOUNTER — Telehealth: Payer: Self-pay | Admitting: Family Medicine

## 2022-08-22 ENCOUNTER — Other Ambulatory Visit: Payer: Medicaid Other

## 2022-08-22 ENCOUNTER — Other Ambulatory Visit: Payer: Self-pay

## 2022-08-22 DIAGNOSIS — N289 Disorder of kidney and ureter, unspecified: Secondary | ICD-10-CM

## 2022-08-22 DIAGNOSIS — E1169 Type 2 diabetes mellitus with other specified complication: Secondary | ICD-10-CM

## 2022-08-22 NOTE — Telephone Encounter (Signed)
LMTCB

## 2022-08-22 NOTE — Telephone Encounter (Signed)
Thayer Headings wanted to make sure we call her for lab work.

## 2022-08-23 ENCOUNTER — Other Ambulatory Visit: Payer: Self-pay | Admitting: Family Medicine

## 2022-08-23 LAB — BMP8+EGFR
BUN/Creatinine Ratio: 11 (ref 10–24)
BUN: 21 mg/dL (ref 8–27)
CO2: 21 mmol/L (ref 20–29)
Calcium: 9.3 mg/dL (ref 8.6–10.2)
Chloride: 106 mmol/L (ref 96–106)
Creatinine, Ser: 1.86 mg/dL — ABNORMAL HIGH (ref 0.76–1.27)
Glucose: 149 mg/dL — ABNORMAL HIGH (ref 70–99)
Potassium: 4.6 mmol/L (ref 3.5–5.2)
Sodium: 142 mmol/L (ref 134–144)
eGFR: 40 mL/min/{1.73_m2} — ABNORMAL LOW (ref >59)

## 2022-08-23 MED ORDER — DAPAGLIFLOZIN PROPANEDIOL 10 MG PO TABS: 10.0000 mg | ORAL_TABLET | Freq: Every day | ORAL | 6 refills | Status: DC

## 2022-08-23 NOTE — Progress Notes (Signed)
Due to altered renal function, will stop Jardiance and spironolactone. Start [place].

## 2022-08-25 ENCOUNTER — Telehealth: Payer: Self-pay

## 2022-08-25 ENCOUNTER — Telehealth: Payer: Self-pay | Admitting: Family Medicine

## 2022-08-25 ENCOUNTER — Other Ambulatory Visit (HOSPITAL_COMMUNITY): Payer: Self-pay

## 2022-08-25 NOTE — Telephone Encounter (Signed)
Error

## 2022-08-25 NOTE — Telephone Encounter (Signed)
Pharmacy Patient Advocate Encounter   Received notification from [facility] that prior authorization for Rybelsus '3MG'$  tablets is required/requested.   PA submitted on 08/25/22 to (ins) [facility] via CoverMyMeds Key [id number] Status is pending

## 2022-08-25 NOTE — Telephone Encounter (Signed)
Pts daughter called stating that Aaron Edelman at [employer] in [place] is waiting on PA to be filled out by PCP for new medicine pt is needing to be started on. Says PA was sent to [place] 2 days ago.   Please advise.

## 2022-08-26 NOTE — Telephone Encounter (Signed)
Pharmacy Patient Advocate Encounter  Received notification from [facility] that the request for prior authorization for Rybelsus '3MG'$  has been denied due to  .    How would you like to proceed?  Please be advised appeals may take up to 5 business days to be submitted as pharmacist prepares necessary documentation.  Thank you!

## 2022-09-02 ENCOUNTER — Telehealth: Payer: Self-pay | Admitting: Pharmacist

## 2022-09-05 DIAGNOSIS — Z419 Encounter for procedure for purposes other than remedying health state, unspecified: Secondary | ICD-10-CM | POA: Diagnosis not present

## 2022-09-09 ENCOUNTER — Ambulatory Visit (INDEPENDENT_AMBULATORY_CARE_PROVIDER_SITE_OTHER): Payer: Medicaid Other | Admitting: Family Medicine

## 2022-09-09 ENCOUNTER — Encounter: Payer: Self-pay | Admitting: Family Medicine

## 2022-09-09 VITALS — BP 155/80 | HR 84 | Temp 97.8°F | Ht 65.0 in | Wt 156.2 lb

## 2022-09-09 DIAGNOSIS — I152 Hypertension secondary to endocrine disorders: Secondary | ICD-10-CM

## 2022-09-09 DIAGNOSIS — N183 Chronic kidney disease, stage 3 unspecified: Secondary | ICD-10-CM | POA: Diagnosis not present

## 2022-09-09 DIAGNOSIS — E1169 Type 2 diabetes mellitus with other specified complication: Secondary | ICD-10-CM

## 2022-09-09 DIAGNOSIS — E1122 Type 2 diabetes mellitus with diabetic chronic kidney disease: Secondary | ICD-10-CM

## 2022-09-09 DIAGNOSIS — E1159 Type 2 diabetes mellitus with other circulatory complications: Secondary | ICD-10-CM | POA: Diagnosis not present

## 2022-09-09 LAB — BMP8+EGFR
BUN/Creatinine Ratio: 12 (ref 10–24)
BUN: 21 mg/dL (ref 8–27)
CO2: 21 mmol/L (ref 20–29)
Calcium: 8.8 mg/dL (ref 8.6–10.2)
Chloride: 105 mmol/L (ref 96–106)
Creatinine, Ser: 1.79 mg/dL — ABNORMAL HIGH (ref 0.76–1.27)
Glucose: 214 mg/dL — ABNORMAL HIGH (ref 70–99)
Potassium: 4.2 mmol/L (ref 3.5–5.2)
Sodium: 142 mmol/L (ref 134–144)
eGFR: 42 mL/min/{1.73_m2} — ABNORMAL LOW (ref >59)

## 2022-09-09 MED ORDER — DAPAGLIFLOZIN PROPANEDIOL 10 MG PO TABS: 10.0000 mg | ORAL_TABLET | Freq: Every day | ORAL | 6 refills | Status: DC

## 2022-09-09 NOTE — Progress Notes (Signed)
Subjective:  Patient ID: Randy Sanchez, male    DOB: 19-Jan-1956, 67 y.o.   MRN: [id number]  Patient Care Team: Baruch Gouty, FNP as PCP - General (Family Medicine) Skeet Latch, MD as PCP - Cardiology (Cardiology)   Chief Complaint:  Labs Only (Renal function denied - 2 week follow up )   HPI: Randy Sanchez is a 67 y.o. male presenting on 09/09/2022 for Labs Only (Renal function denied - 2 week follow up )   Pt presents today for reevaluation of declined renal function associated with diabetes, hypertension, and ischemic cardiomyopathy. He is scheduled to follow up with cardiology for medication adjustments. Has stopped his spironolactone. Wilder Glade was prescribed, insurance declined. He denies changes in urine output. No swelling, weakness, confusion, or fatigue.     Relevant past medical, surgical, family, and social history reviewed and updated as indicated.  Allergies and medications reviewed and updated. Data reviewed: Chart in Epic.   Past Medical History:  Diagnosis Date   Arthritis    Chronic headaches    Diabetes mellitus type II, non insulin dependent ([place]) 07/28/2021   HTN, goal below 130/80 07/28/2021   Hyperlipidemia associated with type 2 diabetes mellitus ([place]), goal LDL < 70 07/28/2021   Ischemic cardiomyopathy 07/28/2021   NSTEMI (non-ST elevated myocardial infarction) ([place]) 07/27/2021   Tobacco abuse 12/20/2021    Past Surgical History:  Procedure Laterality Date   CORONARY STENT INTERVENTION N/A 07/27/2021   Procedure: CORONARY STENT INTERVENTION;  Surgeon: Jettie Booze, MD;  Location: [facility];  Service: Cardiovascular;  Laterality: N/A;   LEFT HEART CATH AND CORONARY ANGIOGRAPHY N/A 07/27/2021   Procedure: LEFT HEART CATH AND CORONARY ANGIOGRAPHY;  Surgeon: Jettie Booze, MD;  Location: [facility];  Service: Cardiovascular;  Laterality: N/A;    Social History   Socioeconomic History   Marital status: Widowed     Spouse name: Not on file   Number of children: 0   Years of education: Not on file   Highest education level: Not on file  Occupational History   Occupation: retired  Tobacco Use   Smoking status: Former    Packs/day: 0.25    Types: Cigarettes    Quit date: 01/31/2022    Years since quitting: 0.6   Smokeless tobacco: Never  Vaping Use   Vaping Use: Never used  Substance and Sexual Activity   Alcohol use: Yes    Alcohol/week: 2.0 standard drinks of alcohol    Types: 2 Shots of liquor per week    Comment: 10-15 shot of Barnabas Lister daniel every other day   Drug use: Not Currently    Types: Cocaine, Marijuana    Comment: last week   Sexual activity: Not Currently  Other Topics Concern   Not on file  Social History Narrative   Not on file   Social Determinants of Health   Financial Resource Strain: Not on file  Food Insecurity: Not on file  Transportation Needs: Not on file  Physical Activity: Not on file  Stress: Not on file  Social Connections: Not on file  Intimate Partner Violence: Not on file    Outpatient Encounter Medications as of 09/09/2022  Medication Sig   aspirin EC 81 MG tablet Take 81 mg by mouth daily. Swallow whole.   atorvastatin (LIPITOR) 80 MG tablet TAKE ONE (1) TABLET BY MOUTH EVERY DAY   Blood Pressure KIT CHECK YOUR BLOOD PRESSURE TWICE A DAY   carvedilol (COREG) 6.25 MG  tablet TAKE 1 TABLET BY MOUTH TWICE DAILY WITH MEALS   dapagliflozin propanediol (FARXIGA) 10 MG TABS tablet Take 1 tablet (10 mg total) by mouth daily before breakfast.   diclofenac Sodium (VOLTAREN) 1 % GEL APPLY 2 GRAMS 4 TIMES DAILY   ENTRESTO 49-51 MG TAKE ONE (1) TABLET BY MOUTH TWO (2) TIMES DAILY   ezetimibe (ZETIA) 10 MG tablet Take 1 tablet (10 mg total) by mouth daily.   isosorbide mononitrate (IMDUR) 30 MG 24 hr tablet Take 30 mg by mouth daily.   nitroGLYCERIN (NITROSTAT) 0.4 MG SL tablet Place 1 tablet (0.4 mg total) under the tongue every 5 (five) minutes x 3 doses as  needed for chest pain.   Semaglutide (RYBELSUS) 3 MG TABS Take 3 mg by mouth daily.   sildenafil (VIAGRA) 50 MG tablet Take 1 tablet (50 mg total) by mouth daily as needed for erectile dysfunction.   [DISCONTINUED] dapagliflozin propanediol (FARXIGA) 10 MG TABS tablet Take 1 tablet (10 mg total) by mouth daily before breakfast. (Patient not taking: Reported on 09/09/2022)   No facility-administered encounter medications on file as of 09/09/2022.    No Known Allergies  Review of Systems  Constitutional:  Negative for activity change, appetite change, chills, fatigue and fever.  HENT: Negative.    Eyes: Negative.   Respiratory:  Negative for cough, chest tightness and shortness of breath.   Cardiovascular:  Negative for chest pain, palpitations and leg swelling.  Gastrointestinal:  Negative for abdominal pain, blood in stool, constipation, diarrhea, nausea and vomiting.  Endocrine: Negative.   Genitourinary:  Negative for decreased urine volume, difficulty urinating, dysuria, frequency and urgency.  Musculoskeletal:  Negative for arthralgias and myalgias.  Skin: Negative.   Allergic/Immunologic: Negative.   Neurological:  Negative for dizziness, weakness and headaches.  Hematological: Negative.   Psychiatric/Behavioral:  Negative for confusion, hallucinations, sleep disturbance and suicidal ideas.   All other systems reviewed and are negative.       Objective:  BP (!) 155/80   Pulse 84   Temp 97.8 F (36.6 C) (Temporal)   Ht '5\' 5"'$  (1.651 m)   Wt 156 lb 3.2 oz (70.9 kg)   SpO2 98%   BMI 25.99 kg/m    Wt Readings from Last 3 Encounters:  09/09/22 156 lb 3.2 oz (70.9 kg)  08/19/22 156 lb 12.8 oz (71.1 kg)  05/20/22 155 lb 9.6 oz (70.6 kg)    Physical Exam Constitutional:      General: He is not in acute distress.    Appearance: Normal appearance. He is normal weight. He is not ill-appearing, toxic-appearing or diaphoretic.  HENT:     Head: Normocephalic and atraumatic.      Mouth/Throat:     Mouth: Mucous membranes are moist.  Eyes:     Pupils: Pupils are equal, round, and reactive to light.  Cardiovascular:     Rate and Rhythm: Normal rate and regular rhythm.  Pulmonary:     Effort: Pulmonary effort is normal.     Breath sounds: Normal breath sounds.  Musculoskeletal:     Right lower leg: No edema.     Left lower leg: No edema.  Skin:    General: Skin is warm and dry.     Capillary Refill: Capillary refill takes less than 2 seconds.  Neurological:     General: No focal deficit present.     Mental Status: He is alert and oriented to person, place, and time.  Psychiatric:  Mood and Affect: Mood normal.        Behavior: Behavior normal.        Thought Content: Thought content normal.        Judgment: Judgment normal.     Results for orders placed or performed in visit on 08/22/22  BMP8+EGFR  Result Value Ref Range   Glucose 149 (H) 70 - 99 mg/dL   BUN 21 8 - 27 mg/dL   Creatinine, Ser 1.86 (H) 0.76 - 1.27 mg/dL   eGFR 40 (L) >59 mL/min/1.73   BUN/Creatinine Ratio 11 10 - 24   Sodium 142 134 - 144 mmol/L   Potassium 4.6 3.5 - 5.2 mmol/L   Chloride 106 96 - 106 mmol/L   CO2 21 20 - 29 mmol/L   Calcium 9.3 8.6 - 10.2 mg/dL       Pertinent labs & imaging results that were available during my care of the patient were reviewed by me and considered in my medical decision making.  Assessment & Plan:  Revin was seen today for labs only.  Diagnoses and all orders for this visit:  CKD stage 3 due to type 2 diabetes mellitus ([place]) Hypertension associated with type 2 diabetes mellitus ([place]) Type 2 diabetes mellitus with other specified complication, without long-term current use of insulin ([place]) Renal function continues to decline. Needs to start [place] for renal protection. Will resend to pharmacy. Repeat labs today. Stay adequately hydrated. Avoid NSAIDs.  -     BMP8+EGFR -     dapagliflozin propanediol (FARXIGA) 10 MG TABS tablet; Take 1  tablet (10 mg total) by mouth daily before breakfast.     Continue all other maintenance medications.  Follow up plan: Return in about 3 months (around 12/09/2022), or if symptoms worsen or fail to improve, for DM.    The above assessment and management plan was discussed with the patient. The patient verbalized understanding of and has agreed to the management plan. Patient is aware to call the clinic if they develop any new symptoms or if symptoms persist or worsen. Patient is aware when to return to the clinic for a follow-up visit. Patient educated on when it is appropriate to go to the emergency department.   Monia Pouch, FNP-C [facility] [phone]

## 2022-09-12 NOTE — Addendum Note (Signed)
Addended by: Baruch Gouty on: 09/12/2022 11:02 AM   Modules accepted: Orders

## 2022-09-19 ENCOUNTER — Ambulatory Visit: Payer: Medicaid Other | Admitting: Neurology

## 2022-09-26 NOTE — Telephone Encounter (Signed)
Thayer Headings calling to check on status of approval for Semaglutide (RYBELSUS) 3 MG TABS . Thayer Headings said she called [facility] and was told to call the office.

## 2022-09-27 ENCOUNTER — Other Ambulatory Visit (HOSPITAL_COMMUNITY): Payer: Self-pay

## 2022-09-27 NOTE — Telephone Encounter (Addendum)
Rybelsus was previously denied because:     There is an appeal form on the denial in [facility] (Key: [id number]). It can be faxed to [phone] or you can call [phone].  Please be advised we currently do not have a Pharmacist to review denials. If you would like [place] to submit it on your behalf, please provide clinical information to support your reason for appeal and any pertinent information you would like [place] to include with the appeal request. Appeals may take longer 5 business days to be submitted as we prepares necessary documentation. Thanks for your support.  How would you like to proceed?

## 2022-09-29 NOTE — Telephone Encounter (Signed)
Thayer Headings aware that rx is denied. She wants to know should pt do or take in the meantime. Please call back

## 2022-09-30 NOTE — Telephone Encounter (Signed)
Calling back to check on PA for Rybelsus

## 2022-09-30 NOTE — Telephone Encounter (Signed)
Thayer Headings aware we are waiting to hear back - please advise

## 2022-10-03 ENCOUNTER — Other Ambulatory Visit: Payer: Self-pay

## 2022-10-06 DIAGNOSIS — Z419 Encounter for procedure for purposes other than remedying health state, unspecified: Secondary | ICD-10-CM | POA: Diagnosis not present

## 2022-10-07 NOTE — Telephone Encounter (Signed)
Can someone advise on this? Sister called and is very frustrated. Says she has been trying to get this figured out since December and feels like no one really cares about her brothers health.   Per insurance, PA for Rybelsus can't be approved until patient tries at least 2 preferred drugs first (Bydureon, Byetta, Trulicity, Victoza, or Ozempic) unless PCP can send supporting chart notes and lab results on why patient needs to take Rybelsus over the preferred drugs.   Please advise and call sister Thayer Headings).

## 2022-10-10 ENCOUNTER — Telehealth: Payer: Self-pay | Admitting: Family Medicine

## 2022-10-10 NOTE — Telephone Encounter (Signed)
Patient's sister came in to ask about his Rybelsus because she has not heard anything.  I gave her the message from [employer] regarding this today.  She wants to know what he needs to do to get the appeal approved.  Has not been given any other medications to try.  She will call back and check on this.  Was given samples of the Rybelsus today.

## 2022-10-10 NOTE — Telephone Encounter (Signed)
Appeal has been sent --medicaid has denied twice, we have been working on this It can take up to 3 business days for appeal There are plenty of samples of rybelsus '3mg'$  daily in closet Patient can double to 2 tabs daily ('6mg'$  if he is past the initial 30 days of rybelsus '3mg'$ )  Thanks! Almyra Free

## 2022-10-11 NOTE — Telephone Encounter (Signed)
Can you direct me to the appeal process or form? Let me know if yall get the paperwork This needs to be appealed since denied x2 Thanks you! Send directly to [employer]x

## 2022-10-12 NOTE — Telephone Encounter (Signed)
Randy Sanchez, I'm working on [employer] this morning and I saw where this was denied and saw this telephone encounter from Raynelle Highland.  Not sure what the outcome is with this and thought you may know.

## 2022-10-13 DIAGNOSIS — I5042 Chronic combined systolic (congestive) and diastolic (congestive) heart failure: Secondary | ICD-10-CM | POA: Diagnosis not present

## 2022-10-13 DIAGNOSIS — E559 Vitamin D deficiency, unspecified: Secondary | ICD-10-CM | POA: Diagnosis not present

## 2022-10-13 DIAGNOSIS — E1129 Type 2 diabetes mellitus with other diabetic kidney complication: Secondary | ICD-10-CM | POA: Diagnosis not present

## 2022-10-13 DIAGNOSIS — R809 Proteinuria, unspecified: Secondary | ICD-10-CM | POA: Diagnosis not present

## 2022-10-13 DIAGNOSIS — N189 Chronic kidney disease, unspecified: Secondary | ICD-10-CM | POA: Diagnosis not present

## 2022-10-13 DIAGNOSIS — G4733 Obstructive sleep apnea (adult) (pediatric): Secondary | ICD-10-CM | POA: Diagnosis not present

## 2022-10-13 DIAGNOSIS — N170 Acute kidney failure with tubular necrosis: Secondary | ICD-10-CM | POA: Diagnosis not present

## 2022-10-13 DIAGNOSIS — E1122 Type 2 diabetes mellitus with diabetic chronic kidney disease: Secondary | ICD-10-CM | POA: Diagnosis not present

## 2022-10-14 NOTE — Telephone Encounter (Signed)
There are 2 boxes of rybelsus for him on my desk Please give to patient if needed (take 2 tabs by mouth every morning--30 min before eat/drink/other meds) Appeal faxed on 10/13/22 Awaiting decision from [facility]

## 2022-10-14 NOTE — Telephone Encounter (Signed)
Ok to sign paperwork per patient's sister.  We do not have any Rybelsus samples available at this time.

## 2022-10-14 NOTE — Telephone Encounter (Signed)
Sister aware and verbalizes understanding per dpr.  States she is not sure if he needs a sample but will call us back if he does.

## 2022-10-17 ENCOUNTER — Other Ambulatory Visit: Payer: Medicaid Other

## 2022-10-17 DIAGNOSIS — N189 Chronic kidney disease, unspecified: Secondary | ICD-10-CM | POA: Diagnosis not present

## 2022-10-17 DIAGNOSIS — R809 Proteinuria, unspecified: Secondary | ICD-10-CM | POA: Diagnosis not present

## 2022-10-17 DIAGNOSIS — N170 Acute kidney failure with tubular necrosis: Secondary | ICD-10-CM | POA: Diagnosis not present

## 2022-10-17 DIAGNOSIS — E1122 Type 2 diabetes mellitus with diabetic chronic kidney disease: Secondary | ICD-10-CM | POA: Diagnosis not present

## 2022-10-17 DIAGNOSIS — E1129 Type 2 diabetes mellitus with other diabetic kidney complication: Secondary | ICD-10-CM | POA: Diagnosis not present

## 2022-10-17 DIAGNOSIS — E559 Vitamin D deficiency, unspecified: Secondary | ICD-10-CM | POA: Diagnosis not present

## 2022-10-17 DIAGNOSIS — I5042 Chronic combined systolic (congestive) and diastolic (congestive) heart failure: Secondary | ICD-10-CM | POA: Diagnosis not present

## 2022-10-17 DIAGNOSIS — G4733 Obstructive sleep apnea (adult) (pediatric): Secondary | ICD-10-CM | POA: Diagnosis not present

## 2022-10-20 NOTE — Telephone Encounter (Signed)
Pharmacy Patient Advocate Encounter  Received notification from Medicaid that the appeal for Rybelsus has been denied due to .    Please be advised we currently do not have a Pharmacist to review denials, therefore you will need to process appeals accordingly as needed. Thanks for your support at this time.  If further action is needed, please see below

## 2022-10-24 ENCOUNTER — Telehealth: Payer: Self-pay | Admitting: Family Medicine

## 2022-10-24 NOTE — Telephone Encounter (Signed)
Pt almost out of his Rybelsus samples and needs more to last him until Almyra Free can figure out what to do with insurance denying appeal for it.   Do we have samples? Call Thayer Headings and let her know.

## 2022-10-24 NOTE — Telephone Encounter (Signed)
2 months worth of samples left up front and [place] notified.

## 2022-11-04 DIAGNOSIS — Z419 Encounter for procedure for purposes other than remedying health state, unspecified: Secondary | ICD-10-CM | POA: Diagnosis not present

## 2022-11-18 ENCOUNTER — Ambulatory Visit (INDEPENDENT_AMBULATORY_CARE_PROVIDER_SITE_OTHER): Payer: Medicaid Other | Admitting: Family Medicine

## 2022-11-18 ENCOUNTER — Ambulatory Visit (INDEPENDENT_AMBULATORY_CARE_PROVIDER_SITE_OTHER): Payer: Medicaid Other

## 2022-11-18 ENCOUNTER — Encounter: Payer: Self-pay | Admitting: Family Medicine

## 2022-11-18 VITALS — BP 148/76 | HR 77 | Temp 97.8°F | Ht 65.0 in | Wt 158.0 lb

## 2022-11-18 DIAGNOSIS — E1122 Type 2 diabetes mellitus with diabetic chronic kidney disease: Secondary | ICD-10-CM

## 2022-11-18 DIAGNOSIS — I5022 Chronic systolic (congestive) heart failure: Secondary | ICD-10-CM

## 2022-11-18 DIAGNOSIS — M79645 Pain in left finger(s): Secondary | ICD-10-CM

## 2022-11-18 DIAGNOSIS — I152 Hypertension secondary to endocrine disorders: Secondary | ICD-10-CM | POA: Diagnosis not present

## 2022-11-18 DIAGNOSIS — F40231 Fear of injections and transfusions: Secondary | ICD-10-CM | POA: Diagnosis not present

## 2022-11-18 DIAGNOSIS — E1169 Type 2 diabetes mellitus with other specified complication: Secondary | ICD-10-CM | POA: Diagnosis not present

## 2022-11-18 DIAGNOSIS — E1159 Type 2 diabetes mellitus with other circulatory complications: Secondary | ICD-10-CM

## 2022-11-18 DIAGNOSIS — N183 Chronic kidney disease, stage 3 unspecified: Secondary | ICD-10-CM

## 2022-11-18 DIAGNOSIS — S62657A Nondisplaced fracture of medial phalanx of left little finger, initial encounter for closed fracture: Secondary | ICD-10-CM | POA: Diagnosis not present

## 2022-11-18 DIAGNOSIS — S62355A Nondisplaced fracture of shaft of fourth metacarpal bone, left hand, initial encounter for closed fracture: Secondary | ICD-10-CM | POA: Diagnosis not present

## 2022-11-18 DIAGNOSIS — I25118 Atherosclerotic heart disease of native coronary artery with other forms of angina pectoris: Secondary | ICD-10-CM | POA: Diagnosis not present

## 2022-11-18 LAB — BAYER DCA HB A1C WAIVED: HB A1C (BAYER DCA - WAIVED): 7.8 % — ABNORMAL HIGH (ref 4.8–5.6)

## 2022-11-18 MED ORDER — RYBELSUS 7 MG PO TABS: 7.0000 mg | ORAL_TABLET | Freq: Every day | ORAL | 6 refills | Status: DC

## 2022-11-18 NOTE — Addendum Note (Signed)
Addended by: Baruch Gouty on: 11/18/2022 02:00 PM   Modules accepted: Level of Service

## 2022-11-18 NOTE — Addendum Note (Signed)
Addended by: Baruch Gouty on: 11/18/2022 01:59 PM   Modules accepted: Level of Service
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
## 2022-11-18 NOTE — Patient Instructions (Signed)

## 2022-11-19 LAB — CMP14+EGFR
ALT: 26 IU/L (ref 0–44)
AST: 23 IU/L (ref 0–40)
Albumin/Globulin Ratio: 1.5 (ref 1.2–2.2)
Albumin: 4.3 g/dL (ref 3.9–4.9)
Alkaline Phosphatase: 81 IU/L (ref 44–121)
BUN/Creatinine Ratio: 13 (ref 10–24)
BUN: 14 mg/dL (ref 8–27)
Bilirubin Total: 0.2 mg/dL (ref 0.0–1.2)
CO2: 19 mmol/L — ABNORMAL LOW (ref 20–29)
Calcium: 9.5 mg/dL (ref 8.6–10.2)
Chloride: 103 mmol/L (ref 96–106)
Creatinine, Ser: 1.08 mg/dL (ref 0.76–1.27)
Globulin, Total: 2.8 g/dL (ref 1.5–4.5)
Glucose: 155 mg/dL — ABNORMAL HIGH (ref 70–99)
Potassium: 4.8 mmol/L (ref 3.5–5.2)
Sodium: 140 mmol/L (ref 134–144)
Total Protein: 7.1 g/dL (ref 6.0–8.5)
eGFR: 77 mL/min/{1.73_m2} (ref >59)

## 2022-11-19 LAB — VITAMIN D 25 HYDROXY (VIT D DEFICIENCY, FRACTURES): Vit D, 25-Hydroxy: 22.6 ng/mL — ABNORMAL LOW (ref 30.0–100.0)

## 2022-11-20 LAB — MICROALBUMIN / CREATININE URINE RATIO
Creatinine, Urine: 111 mg/dL
Microalb/Creat Ratio: 260 mg/g creat — ABNORMAL HIGH (ref 0–29)
Microalbumin, Urine: 289.1 ug/mL

## 2022-11-21 ENCOUNTER — Ambulatory Visit (HOSPITAL_BASED_OUTPATIENT_CLINIC_OR_DEPARTMENT_OTHER): Payer: Medicaid Other | Admitting: Cardiovascular Disease

## 2022-11-24 ENCOUNTER — Ambulatory Visit (INDEPENDENT_AMBULATORY_CARE_PROVIDER_SITE_OTHER): Payer: Medicaid Other | Admitting: Orthopaedic Surgery

## 2022-11-24 ENCOUNTER — Encounter: Payer: Self-pay | Admitting: Orthopaedic Surgery

## 2022-11-24 VITALS — Ht 65.0 in | Wt 158.0 lb

## 2022-11-24 DIAGNOSIS — S62657A Nondisplaced fracture of medial phalanx of left little finger, initial encounter for closed fracture: Secondary | ICD-10-CM

## 2022-11-24 DIAGNOSIS — S62609A Fracture of unspecified phalanx of unspecified finger, initial encounter for closed fracture: Secondary | ICD-10-CM | POA: Insufficient documentation
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
## 2022-12-05 DIAGNOSIS — Z419 Encounter for procedure for purposes other than remedying health state, unspecified: Secondary | ICD-10-CM | POA: Diagnosis not present

## 2022-12-06 DIAGNOSIS — R809 Proteinuria, unspecified: Secondary | ICD-10-CM | POA: Diagnosis not present

## 2022-12-06 DIAGNOSIS — E1122 Type 2 diabetes mellitus with diabetic chronic kidney disease: Secondary | ICD-10-CM | POA: Diagnosis not present

## 2022-12-06 DIAGNOSIS — Z7689 Persons encountering health services in other specified circumstances: Secondary | ICD-10-CM | POA: Diagnosis not present

## 2022-12-06 DIAGNOSIS — N189 Chronic kidney disease, unspecified: Secondary | ICD-10-CM | POA: Diagnosis not present

## 2022-12-06 DIAGNOSIS — I5042 Chronic combined systolic (congestive) and diastolic (congestive) heart failure: Secondary | ICD-10-CM | POA: Diagnosis not present

## 2022-12-06 DIAGNOSIS — E1129 Type 2 diabetes mellitus with other diabetic kidney complication: Secondary | ICD-10-CM | POA: Diagnosis not present

## 2022-12-06 DIAGNOSIS — E559 Vitamin D deficiency, unspecified: Secondary | ICD-10-CM | POA: Diagnosis not present

## 2022-12-06 DIAGNOSIS — G4733 Obstructive sleep apnea (adult) (pediatric): Secondary | ICD-10-CM | POA: Diagnosis not present

## 2022-12-15 ENCOUNTER — Ambulatory Visit: Payer: Medicaid Other | Admitting: Orthopaedic Surgery

## 2022-12-22 ENCOUNTER — Telehealth: Payer: Self-pay | Admitting: Family Medicine

## 2022-12-22 DIAGNOSIS — E1169 Type 2 diabetes mellitus with other specified complication: Secondary | ICD-10-CM

## 2022-12-22 DIAGNOSIS — I25118 Atherosclerotic heart disease of native coronary artery with other forms of angina pectoris: Secondary | ICD-10-CM

## 2022-12-22 DIAGNOSIS — F40231 Fear of injections and transfusions: Secondary | ICD-10-CM

## 2022-12-22 DIAGNOSIS — I5022 Chronic systolic (congestive) heart failure: Secondary | ICD-10-CM

## 2022-12-22 MED ORDER — RYBELSUS 7 MG PO TABS: 7.0000 mg | ORAL_TABLET | Freq: Every day | ORAL | 6 refills | Status: DC

## 2022-12-22 NOTE — Telephone Encounter (Signed)
Patient wanted rebylsus not ozempic no samples at this time sent to the drug store

## 2022-12-22 NOTE — Telephone Encounter (Signed)
Patient is in need of Ozempic samples

## 2022-12-23 ENCOUNTER — Encounter (HOSPITAL_BASED_OUTPATIENT_CLINIC_OR_DEPARTMENT_OTHER): Payer: Self-pay | Admitting: Family

## 2022-12-23 ENCOUNTER — Ambulatory Visit (INDEPENDENT_AMBULATORY_CARE_PROVIDER_SITE_OTHER): Payer: Medicaid Other | Admitting: Family

## 2022-12-23 VITALS — BP 160/100 | HR 109 | Ht 65.0 in | Wt 161.0 lb

## 2022-12-23 DIAGNOSIS — I5022 Chronic systolic (congestive) heart failure: Secondary | ICD-10-CM

## 2022-12-23 DIAGNOSIS — E785 Hyperlipidemia, unspecified: Secondary | ICD-10-CM

## 2022-12-23 DIAGNOSIS — I25118 Atherosclerotic heart disease of native coronary artery with other forms of angina pectoris: Secondary | ICD-10-CM

## 2022-12-23 DIAGNOSIS — I255 Ischemic cardiomyopathy: Secondary | ICD-10-CM

## 2022-12-23 DIAGNOSIS — Z720 Tobacco use: Secondary | ICD-10-CM

## 2022-12-23 DIAGNOSIS — I10 Essential (primary) hypertension: Secondary | ICD-10-CM | POA: Diagnosis not present

## 2022-12-23 DIAGNOSIS — Z7689 Persons encountering health services in other specified circumstances: Secondary | ICD-10-CM | POA: Diagnosis not present

## 2022-12-23 MED ORDER — ENTRESTO 49-51 MG PO TABS: 1.0000 | ORAL_TABLET | Freq: Two times a day (BID) | ORAL | 5 refills | Status: DC

## 2022-12-23 MED ORDER — CARVEDILOL 12.5 MG PO TABS: 12.5000 mg | ORAL_TABLET | Freq: Two times a day (BID) | ORAL | 5 refills | Status: DC

## 2022-12-23 NOTE — Progress Notes (Unsigned)
Office Visit    Patient Name: Randy Sanchez Date of Encounter: 12/28/2022  PCP:  Sonny Masters, FNP   [facility]  Cardiologist:  Chilton Si, MD  Advanced Practice Provider:  No care team member to display Electrophysiologist:  None      Chief Complaint    Randy Sanchez is a 67 y.o. male presents today for heart failure follow up.    Past Medical History    Past Medical History:  Diagnosis Date   Arthritis    Chronic headaches    Diabetes mellitus type II, non insulin dependent 07/28/2021   HTN, goal below 130/80 07/28/2021   Hyperlipidemia associated with type 2 diabetes mellitus (HCC), goal LDL < 70 07/28/2021   Ischemic cardiomyopathy 07/28/2021   NSTEMI (non-ST elevated myocardial infarction) 07/27/2021   Tobacco abuse 12/20/2021   Past Surgical History:  Procedure Laterality Date   CORONARY STENT INTERVENTION N/A 07/27/2021   Procedure: CORONARY STENT INTERVENTION;  Surgeon: Corky Crafts, MD;  Location: [facility] INVASIVE CV LAB;  Service: Cardiovascular;  Laterality: N/A;   LEFT HEART CATH AND CORONARY ANGIOGRAPHY N/A 07/27/2021   Procedure: LEFT HEART CATH AND CORONARY ANGIOGRAPHY;  Surgeon: Corky Crafts, MD;  Location: [facility] INVASIVE CV LAB;  Service: Cardiovascular;  Laterality: N/A;    Allergies  No Known Allergies  History of Present Illness    Randy Sanchez is a 67 y.o. male with a hx of coronary disease s/p DES to Cx 07/2021, hypertension, ICM, HFrEF, hyperlipidemia, tobacco use, diabetes, OSA (declines CPAP). Last seen 06/21/22.    He presented to the ED 07/25/2021 with chest pain with troponin peak >24,000.  Cardiac catheterization showed multivessel disease with culprit lesion being OM1 treated with PTCA.  Also 80% circumflex treated with DES.  Echo during admission showed LVEF 40-45%.  He had not been on any medications since 2018.  Jardiance was started for diabetes.  Entresto, carvedilol started for HFrEF and  HTN.  Aspirin, statin, Imdur, beta-blocker started for coronary disease. Repeat echocardiogram 11/2021 LVEF 40-45%. At visit 12/20/21 with Dr. Duke Salvia BP not at goal, [facility] increased.   Last telehealth visit 06/21/22. Imdur stopped due to ED. As no recurrent chest pain, he was given Rx for Sildenafil.   Presents today for follow-up Continue to walk for exercise.  Eating mostly at home and his sister brings him meals as well. Tells me he is not smoking. BP has not been at goal <130/80.    EKGs/Labs/Other Studies Reviewed:   The following studies were reviewed today: Cardiac Studies & Procedures   CARDIAC CATHETERIZATION  CARDIAC CATHETERIZATION 07/27/2021  Narrative   1st Mrg-1 lesion is 100% stenosed.  Releatively small vessel. Culprit for MI.  Balloon angioplasty was performed using a BALLN SAPPHIRE 2.0X15.   Post intervention, there is a 25% residual stenosis.   1st Mrg-2 lesion is 75% stenosed.  Balloon angioplasty was performed using a BALLN SAPPHIRE 2.0X15.   Post intervention, there is a 25% residual stenosis.   Ost LAD to Prox LAD lesion is 25% stenosed.   Mid Cx lesion is 80% stenosed.   A drug-eluting stent was successfully placed using a STENT ONYX FRONTIER 3.0X15, postdilated to 3.25 mm.   Post intervention, there is a 0% residual stenosis.   2nd Diag lesion is 80% stenosed.   RPDA-2 lesion is 50% stenosed.   RPDA-1 lesion is 50% stenosed.   Prox RCA lesion is 25% stenosed.   There is mild to moderate left  ventricular systolic dysfunction.   LV end diastolic pressure is low.   The left ventricular ejection fraction is 35-45% by visual estimate.   There is no aortic valve stenosis.  I stressed the importance of dual antiplatelet therapy.  He will need aggressive secondary prevention including avoidance of any illicit substances, or tobacco.  He will need aggressive blood pressure control.  High-dose statin.  Findings Coronary Findings Diagnostic  Dominance:  Right  Left Anterior Descending Ost LAD to Prox LAD lesion is 25% stenosed.  Second Diagonal Branch 2nd Diag lesion is 80% stenosed.  Left Circumflex Mid Cx lesion is 80% stenosed.  First Obtuse Marginal Branch 1st Mrg-1 lesion is 100% stenosed. Vessel is the culprit lesion. The lesion is type C. 1st Mrg-2 lesion is 75% stenosed.  Right Coronary Artery Vessel is large. There is mild diffuse disease throughout the vessel. Prox RCA lesion is 25% stenosed.  Right Posterior Descending Artery RPDA-1 lesion is 50% stenosed. RPDA-2 lesion is 50% stenosed.  Intervention  Mid Cx lesion Stent CATH LAUNCHER 6FR EBU 3.5 SH guide catheter was inserted. Lesion crossed with guidewire using a WIRE ASAHI PROWATER 180CM. Pre-stent angioplasty was not performed. A drug-eluting stent was successfully placed using a STENT ONYX FRONTIER 3.0X15. Stent strut is well apposed. Post-stent angioplasty was performed using a BALLN SAPPHIRE [place] 3.25X10. Repeat PTCA of the OM was done after the stent was placed. Post-Intervention Lesion Assessment The intervention was successful. Pre-interventional TIMI flow is 3. Post-intervention TIMI flow is 3. No complications occurred at this lesion. There is a 0% residual stenosis post intervention.  1st Mrg-1 lesion Angioplasty CATH LAUNCHER 6FR EBU 3.5 SH guide catheter was inserted. WIRE HI TORQ BMW 190CM guidewire used to cross lesion. Balloon angioplasty was performed using a BALLN SAPPHIRE 2.0X15. Post-Intervention Lesion Assessment The intervention was successful. Pre-interventional TIMI flow is 0. Post-intervention TIMI flow is 3. No complications occurred at this lesion. There is a 25% residual stenosis post intervention.  1st Mrg-2 lesion Angioplasty CATH LAUNCHER 6FR EBU 3.5 SH guide catheter was inserted. WIRE HI TORQ BMW 190CM guidewire used to cross lesion. Balloon angioplasty was performed using a BALLN SAPPHIRE 2.0X15. Post-Intervention Lesion  Assessment The intervention was successful. Pre-interventional TIMI flow is 3. Post-intervention TIMI flow is 3. No complications occurred at this lesion. Multiple doses of IC nitroglycerin helped improve the appearance of the OM. There is a 25% residual stenosis post intervention.     ECHOCARDIOGRAM  ECHOCARDIOGRAM COMPLETE 11/15/2021  Narrative ECHOCARDIOGRAM REPORT    Patient Name:   Randy Sanchez Date of Exam: 11/15/2021 Medical Rec #:  [id number]      Height:       67.0 in Accession #:    [id number]     Weight:       148.0 lb Date of Birth:  24-Dec-1955       BSA:          1.779 m Patient Age:    65 years       BP:           115/72 mmHg Patient Gender: M              HR:           79 bpm. [facility]  Procedure: 2D Echo, Cardiac Doppler and Color Doppler  Indications:    I25.5 (ICD-10-CM) - Ischemic cardiomyopathy I50.22 (ICD-10-CM) - Chronic systolic heart failure (HCC)  History:        Patient has  prior history of Echocardiogram examinations, most recent 07/27/2021. Cardiomyopathy, Previous Myocardial Infarction and CAD; Risk Factors:Hypertension, Diabetes and Dyslipidemia.  Sonographer:    Celesta Gentile RCS Referring Phys: [id number] Jaden Batchelder S Toddrick Sanna  IMPRESSIONS   1. Left ventricular ejection fraction, by estimation, is 40 to 45%. The left ventricle has mildly decreased function. The left ventricle demonstrates regional wall motion abnormalities (see scoring diagram/findings for description). The mid-to-distal anterolateral and inferolateral LV segments are hypokinetic. There is mild concentric left ventricular hypertrophy. Left ventricular diastolic parameters are consistent with Grade I diastolic dysfunction (impaired relaxation). 2. Right ventricular systolic function is normal. The right ventricular size is normal. Tricuspid regurgitation signal is inadequate for assessing PA pressure. 3. The pericardial effusion is posterior to the left ventricle. 4.  The mitral valve is normal in structure. Mild mitral valve regurgitation. 5. The aortic valve is tricuspid. There is mild calcification of the aortic valve. There is mild thickening of the aortic valve. Aortic valve regurgitation is not visualized. Aortic valve sclerosis/calcification is present, without any evidence of aortic stenosis. 6. The inferior vena cava is normal in size with greater than 50% respiratory variability, suggesting right atrial pressure of 3 mmHg.  Comparison(s): Compared to prior study on 07/2021, there is no significant change.  FINDINGS Left Ventricle: Left ventricular ejection fraction, by estimation, is 40 to 45%. The left ventricle has mildly decreased function. The left ventricle demonstrates regional wall motion abnormalities. The mid-to-distal anterolateral and inferolateral walls are hypokinetic. The left ventricular internal cavity size was normal in size. There is mild concentric left ventricular hypertrophy. Left ventricular diastolic parameters are consistent with Grade I diastolic dysfunction (impaired relaxation).  Right Ventricle: The right ventricular size is normal. No increase in right ventricular wall thickness. Right ventricular systolic function is normal. Tricuspid regurgitation signal is inadequate for assessing PA pressure.  Left Atrium: Left atrial size was normal in size.  Right Atrium: Right atrial size was normal in size.  Pericardium: Trivial pericardial effusion is present. The pericardial effusion is posterior to the left ventricle.  Mitral Valve: The mitral valve is normal in structure. There is mild thickening of the mitral valve leaflet(s). There is mild calcification of the mitral valve leaflet(s). Mild mitral valve regurgitation.  Tricuspid Valve: The tricuspid valve is normal in structure. Tricuspid valve regurgitation is trivial.  Aortic Valve: The aortic valve is tricuspid. There is mild calcification of the aortic valve. There is  mild thickening of the aortic valve. Aortic valve regurgitation is not visualized. Aortic valve sclerosis/calcification is present, without any evidence of aortic stenosis.  Pulmonic Valve: The pulmonic valve was normal in structure. Pulmonic valve regurgitation is trivial.  Aorta: The aortic root is normal in size and structure.  Venous: The inferior vena cava is normal in size with greater than 50% respiratory variability, suggesting right atrial pressure of 3 mmHg.  IAS/Shunts: The atrial septum is grossly normal.   LEFT VENTRICLE PLAX 2D LVIDd:         4.50 cm   Diastology LVIDs:         3.40 cm   LV e' medial:    3.37 cm/s LV PW:         1.30 cm   LV E/e' medial:  15.9 LV IVS:        1.20 cm   LV e' lateral:   6.96 cm/s LVOT diam:     1.90 cm   LV E/e' lateral: 7.7 LV SV:  42 LV SV Index:   24 LVOT Area:     2.84 cm   RIGHT VENTRICLE RV S prime:     12.20 cm/s TAPSE (M-mode): 1.3 cm  LEFT ATRIUM           Index        RIGHT ATRIUM           Index LA diam:      3.20 cm 1.80 cm/m   RA Area:     11.30 cm LA Vol (A4C): 23.8 ml 13.38 ml/m  RA Volume:   23.30 ml  13.09 ml/m AORTIC VALVE LVOT Vmax:   73.10 cm/s LVOT Vmean:  49.700 cm/s LVOT VTI:    0.148 m  AORTA Ao Root diam: 3.00 cm  MITRAL VALVE MV Area (PHT): 3.31 cm    SHUNTS MV Decel Time: 229 msec    Systemic VTI:  0.15 m MV E velocity: 53.50 cm/s  Systemic Diam: 1.90 cm MV A velocity: 79.90 cm/s MV E/A ratio:  0.67  Laurance Flatten MD Electronically signed by Laurance Flatten MD Signature Date/Time: 11/15/2021/4:48:27 PM    Final              EKG:  EKG is  ordered today.  The ekg ordered today demonstrates ST 109 bpm with no acute ST/T wave changes.   Recent Labs: 08/19/2022: Hemoglobin 13.9; Platelets 305; TSH 2.330 11/18/2022: ALT 26; BUN 14; Creatinine, Ser 1.08; Potassium 4.8; Sodium 140  Recent Lipid Panel    Component Value Date/Time   CHOL 149 08/19/2022 0833   TRIG 120  08/19/2022 0833   HDL 59 08/19/2022 0833   CHOLHDL 2.5 08/19/2022 0833   CHOLHDL 5.2 07/27/2021 0646   VLDL 18 07/27/2021 0646   LDLCALC 69 08/19/2022 0833   Home Medications   Current Meds  Medication Sig   aspirin EC 81 MG tablet Take 81 mg by mouth daily. Swallow whole.   atorvastatin (LIPITOR) 80 MG tablet TAKE ONE (1) TABLET BY MOUTH EVERY DAY   Blood Pressure KIT CHECK YOUR BLOOD PRESSURE TWICE A DAY   dapagliflozin propanediol (FARXIGA) 10 MG TABS tablet Take 1 tablet (10 mg total) by mouth daily before breakfast.   diclofenac Sodium (VOLTAREN) 1 % GEL APPLY 2 GRAMS 4 TIMES DAILY   ezetimibe (ZETIA) 10 MG tablet Take 1 tablet (10 mg total) by mouth daily.   nitroGLYCERIN (NITROSTAT) 0.4 MG SL tablet Place 1 tablet (0.4 mg total) under the tongue every 5 (five) minutes x 3 doses as needed for chest pain.   Semaglutide (RYBELSUS) 7 MG TABS Take 1 tablet (7 mg total) by mouth daily.   sildenafil (VIAGRA) 50 MG tablet Take 1 tablet (50 mg total) by mouth daily as needed for erectile dysfunction.   [DISCONTINUED] carvedilol (COREG) 6.25 MG tablet TAKE 1 TABLET BY MOUTH TWICE DAILY WITH MEALS   [DISCONTINUED] ENTRESTO 49-51 MG TAKE ONE (1) TABLET BY MOUTH TWO (2) TIMES DAILY   [DISCONTINUED] isosorbide mononitrate (IMDUR) 30 MG 24 hr tablet Take 30 mg by mouth daily.     Review of Systems      All other systems reviewed and are otherwise negative except as noted above.  Physical Exam    VS:  BP (!) 160/100   Pulse (!) 109   Ht  (1.651 m)   Wt 161 lb (73 kg)   BMI 26.79 kg/m  , BMI Body mass index is 26.79 kg/m.  Wt Readings from Last 3 Encounters:  12/23/22 161 lb (73 kg)  11/24/22 158 lb (71.7 kg)  11/18/22 158 lb (71.7 kg)     GEN: Well nourished, well developed, in no acute distress. HEENT: normal. Neck: Supple, no JVD, carotid bruits, or masses. Cardiac: RRR, no murmurs, rubs, or gallops. No clubbing, cyanosis, edema.  Radials/PT 2+ and equal bilaterally.   Respiratory:  Respirations regular and unlabored, clear to auscultation bilaterally. GI: Soft, nontender, nondistended. MS: No deformity or atrophy. Skin: Warm and dry, no rash. Neuro:  Strength and sensation are intact. Psych: Normal affect.  Assessment & Plan    CAD s/p DES Cx 07/2021 / HLD, LDL goal <70 / ED - Continue GDMT aspirin, Brilinta, Coreg, Jardiance. Imdur previously stopped due to ED.  Heart healthy diet and regular cardiovascular exercise encouraged.    DM2 - Continue to follow with PCP.   HTN - BP not at goal. Increase Coreg to 12.5mg  BID. Continue Entresto 49-51mg  BID. Could further increase Coreg or Entresto at follow up. Discussed to monitor BP at home at least 2 hours after medications and sitting for 5-10 minutes.   ICM / HFrEF - 11/2021 LVEF 40-45%. Euvolemic and well compensated on exam.  Continue GDMT Coreg, Jardiance, Entresto. Unclear why no longer taking Spironolactone. Readdress at follow up. Careful avoidance of polypharmacy. Low sodium diet, fluid restriction <2L, and daily weights encouraged. Educated to contact our office for weight gain of 2 lbs overnight or 5 lbs in one week.   Tobacco use - Continue smoking cessation encouraged. Recommend utilization of 1800QUITNOW.        Disposition: Follow up  in 3- 4months  with Chilton Si, MD or APP.  Signed, Alver Sorrow, NP 12/28/2022, 8:23 PM [facility]

## 2022-12-23 NOTE — Patient Instructions (Addendum)
Medication Instructions:  Your physician has recommended you make the following change in your medication:   INCREASE Carvedilol to 12.5mg  twice daily   We have sent in a new prescription   *If you need a refill on your cardiac medications before your next appointment, please call your pharmacy*   Testing/Procedures: Your EKG today showed sinus tachycardia which is a normal but fast heart rate.   Follow-Up: At [facility], you and your health needs are our priority.  As part of our continuing mission to provide you with exceptional heart care, we have created designated Provider Care Teams.  These Care Teams include your primary Cardiologist (physician) and Advanced Practice Providers (APPs -  Physician Assistants and Nurse Practitioners) who all work together to provide you with the care you need, when you need it.  We recommend signing up for the patient portal called "MyChart".  Sign up information is provided on this After Visit Summary.  MyChart is used to connect with patients for Virtual Visits (Telemedicine).  Patients are able to view lab/test results, encounter notes, upcoming appointments, etc.  Non-urgent messages can be sent to your provider as well.   To learn more about what you can do with MyChart, go to [email or web address].    Your next appointment:   3-4 month(s)  Provider:   Chilton Si, MD or Gillian Shields, NP  Other Instructions  Heart Healthy Diet Recommendations: A low-salt diet is recommended. Meats should be grilled, baked, or boiled. Avoid fried foods. Focus on lean protein sources like fish or chicken with vegetables and fruits. The American Heart Association is a Chief Technology Officer!  American Heart Association Diet and Lifeystyle Recommendations   Exercise recommendations: The American Heart Association recommends 150 minutes of moderate intensity exercise weekly. Try 30 minutes of moderate intensity exercise 4-5 times per week. This could  include walking, jogging, or swimming.  To prevent palpitations: Make sure you are adequately hydrated.  Avoid and/or limit caffeine containing beverages like soda or tea. Exercise regularly.  Manage stress well. Some over the counter medications can cause palpitations such as Benadryl, AdvilPM, TylenolPM. Regular Advil or Tylenol do not cause palpitations.

## 2022-12-26 ENCOUNTER — Telehealth: Payer: Self-pay

## 2022-12-26 NOTE — Telephone Encounter (Signed)
Randy Sanchez (KeyZara Chess) Rx #: [id number] Rybelsus  tablets Form [employer] Electronic Prior Authorization Request Form [phone] NCPDP) Created 3 days ago Sent to Plan 5 minutes ago Plan Response 5 minutes ago Submit Clinical Questions less than a minute ago Determination Wait for Determination Please wait for [facility] Medicaid 2017 to return a determination.

## 2023-01-04 DIAGNOSIS — Z419 Encounter for procedure for purposes other than remedying health state, unspecified: Secondary | ICD-10-CM | POA: Diagnosis not present

## 2023-01-10 ENCOUNTER — Other Ambulatory Visit (HOSPITAL_COMMUNITY): Payer: Self-pay

## 2023-01-10 ENCOUNTER — Telehealth: Payer: Self-pay

## 2023-01-10 NOTE — Telephone Encounter (Signed)
Pt has new plan, please see additional encounter created.

## 2023-01-10 NOTE — Telephone Encounter (Signed)
PA request received via provider for Rybelsus 7MG  tablets  PA has been submitted to [facility] Medicaid of [place] with updated ID and is pending additional questions/determination  Key: [id number]

## 2023-01-17 NOTE — Telephone Encounter (Signed)
Patient Advocate Encounter  Received a fax from [facility] regarding Prior Authorization for Rybelsus.   Authorization has been DENIED due to    Determination letter attached to patient chart

## 2023-01-25 ENCOUNTER — Telehealth: Payer: Self-pay | Admitting: Family Medicine

## 2023-01-25 NOTE — Telephone Encounter (Signed)
Per chart PA was denied.  We do not have reybelsus 5mg - only the 3mg . Please review and advise

## 2023-01-27 NOTE — Telephone Encounter (Signed)
Spoke to patients sister per signed DPR. She will come by and pick up medication

## 2023-01-27 NOTE — Telephone Encounter (Signed)
Calling to check on the samples. Please call back.

## 2023-01-31 NOTE — Telephone Encounter (Signed)
Patient is needle phobic and is unable to do injections at this time.  Can you appeal, please?!  Let me know if I need to assist

## 2023-01-31 NOTE — Telephone Encounter (Signed)
Requested appeal from prior auth team Requested high priority and to reach out to me if needed We have tried multiple times to get approved!  Routing to PCP/clinical as an Financial planner

## 2023-02-02 NOTE — Telephone Encounter (Signed)
Called insurance about an appeal. Received fax asking for patient consent. I have attached letter to charts for your review. There is an appeal form on the denial letter (in media). Looks like the appeal form needs patient consent as well.      Please be advised we currently do not have a Pharmacist to review denials, therefore you will need to process appeals accordingly as needed. Thanks for your support at this time.

## 2023-02-03 DIAGNOSIS — I5020 Unspecified systolic (congestive) heart failure: Secondary | ICD-10-CM | POA: Diagnosis not present

## 2023-02-03 DIAGNOSIS — N182 Chronic kidney disease, stage 2 (mild): Secondary | ICD-10-CM | POA: Diagnosis not present

## 2023-02-03 DIAGNOSIS — E785 Hyperlipidemia, unspecified: Secondary | ICD-10-CM | POA: Diagnosis not present

## 2023-02-03 DIAGNOSIS — E1122 Type 2 diabetes mellitus with diabetic chronic kidney disease: Secondary | ICD-10-CM | POA: Diagnosis not present

## 2023-02-03 DIAGNOSIS — I129 Hypertensive chronic kidney disease with stage 1 through stage 4 chronic kidney disease, or unspecified chronic kidney disease: Secondary | ICD-10-CM | POA: Diagnosis not present

## 2023-02-03 NOTE — Telephone Encounter (Signed)
Received fax that appeal request has been received. Appeal will be finish by 02/18/23. Any reports or updates to help approve request must be received by 02/18/23. Fax attached to charts.

## 2023-02-10 NOTE — Telephone Encounter (Signed)
Received a fax from [facility] stating additional information is needed for redetermination of Rybelsus 7mg  tablet.   Faxed information to [phone]   Case ID: [id number]

## 2023-02-15 ENCOUNTER — Other Ambulatory Visit (HOSPITAL_COMMUNITY): Payer: Self-pay

## 2023-02-15 NOTE — Telephone Encounter (Addendum)
Appeal for Rybelsus has been approved. Effective dates: 01/10/23-02/15/24    Approval letter in media

## 2023-02-21 ENCOUNTER — Ambulatory Visit: Payer: Medicaid Other | Admitting: Family Medicine

## 2023-02-21 ENCOUNTER — Encounter: Payer: Self-pay | Admitting: Family Medicine

## 2023-02-21 VITALS — BP 134/74 | HR 81 | Temp 97.5°F | Ht 65.0 in | Wt 160.6 lb

## 2023-02-21 DIAGNOSIS — E559 Vitamin D deficiency, unspecified: Secondary | ICD-10-CM

## 2023-02-21 DIAGNOSIS — I25118 Atherosclerotic heart disease of native coronary artery with other forms of angina pectoris: Secondary | ICD-10-CM | POA: Diagnosis not present

## 2023-02-21 DIAGNOSIS — L989 Disorder of the skin and subcutaneous tissue, unspecified: Secondary | ICD-10-CM | POA: Diagnosis not present

## 2023-02-21 DIAGNOSIS — E1169 Type 2 diabetes mellitus with other specified complication: Secondary | ICD-10-CM

## 2023-02-21 DIAGNOSIS — E1159 Type 2 diabetes mellitus with other circulatory complications: Secondary | ICD-10-CM | POA: Diagnosis not present

## 2023-02-21 DIAGNOSIS — N183 Chronic kidney disease, stage 3 unspecified: Secondary | ICD-10-CM

## 2023-02-21 DIAGNOSIS — E1122 Type 2 diabetes mellitus with diabetic chronic kidney disease: Secondary | ICD-10-CM | POA: Diagnosis not present

## 2023-02-21 DIAGNOSIS — Z7984 Long term (current) use of oral hypoglycemic drugs: Secondary | ICD-10-CM | POA: Diagnosis not present

## 2023-02-21 DIAGNOSIS — I152 Hypertension secondary to endocrine disorders: Secondary | ICD-10-CM | POA: Diagnosis not present

## 2023-02-21 LAB — CMP14+EGFR
AST: 21 IU/L (ref 0–40)
Bilirubin Total: <0.2 mg/dL (ref 0.0–1.2)
Glucose: 268 mg/dL — ABNORMAL HIGH (ref 70–99)
Sodium: 140 mmol/L (ref 134–144)
Total Protein: 6.4 g/dL (ref 6.0–8.5)

## 2023-02-21 LAB — BAYER DCA HB A1C WAIVED: HB A1C (BAYER DCA - WAIVED): 7.8 % — ABNORMAL HIGH (ref 4.8–5.6)

## 2023-02-21 MED ORDER — GLIPIZIDE 5 MG PO TABS
5.0000 mg | ORAL_TABLET | Freq: Two times a day (BID) | ORAL | 3 refills | Status: DC
Start: 2023-02-21 — End: 2023-07-04

## 2023-02-21 NOTE — Patient Instructions (Addendum)
Goal BP:  °For patients younger than 60: Goal BP < 140/90. °For patients 60 and older: Goal BP < 150/90. °For patients with diabetes: Goal BP < 140/90. ° °Take your medications faithfully as prescribed. °Maintain a healthy weight. °Get at least 150 minutes of aerobic exercise per week. °Minimize salt intake, less than 2000 mg per day. °Minimize alcohol intake. ° °DASH Eating Plan °DASH stands for "Dietary Approaches to Stop Hypertension." The DASH eating plan is a healthy eating plan that has been shown to reduce high blood pressure (hypertension). Additional health benefits may include reducing the risk of type 2 diabetes mellitus, heart disease, and stroke. The DASH eating plan may also help with weight loss. ° °WHAT DO I NEED TO KNOW ABOUT THE DASH EATING PLAN? °For the DASH eating plan, you will follow these general guidelines: °Choose foods with a percent daily value for sodium of less than 5% (as listed on the food label). °Use salt-free seasonings or herbs instead of table salt or sea salt. °Check with your health care provider or pharmacist before using salt substitutes. °Eat lower-sodium products, often labeled as "lower sodium" or "no salt added." °Eat fresh foods. °Eat more vegetables, fruits, and low-fat dairy products. °Choose whole grains. Look for the word "whole" as the first word in the ingredient list. °Choose fish and skinless chicken or turkey more often than red meat. Limit fish, poultry, and meat to 6 oz (170 g) each day. °Limit sweets, desserts, sugars, and sugary drinks. °Choose heart-healthy fats. °Limit cheese to 1 oz (28 g) per day. °Eat more home-cooked food and less restaurant, buffet, and fast food. °Limit fried foods. °Cook foods using methods other than frying. °Limit canned vegetables. If you do use them, rinse them well to decrease the sodium. °When eating at a restaurant, ask that your food be prepared with less salt, or no salt if possible. ° °WHAT FOODS CAN I EAT? °Seek help from  a dietitian for individual calorie needs. ° °Grains °Whole grain or whole wheat bread. Brown rice. Whole grain or whole wheat pasta. Quinoa, bulgur, and whole grain cereals. Low-sodium cereals. Corn or whole wheat flour tortillas. Whole grain cornbread. Whole grain crackers. Low-sodium crackers. ° °Vegetables °Fresh or frozen vegetables (raw, steamed, roasted, or grilled). Low-sodium or reduced-sodium tomato and vegetable juices. Low-sodium or reduced-sodium tomato sauce and paste. Low-sodium or reduced-sodium canned vegetables.  ° °Fruits °All fresh, canned (in natural juice), or frozen fruits. ° °Meat and Other Protein Products °Ground beef (85% or leaner), grass-fed beef, or beef trimmed of fat. Skinless chicken or turkey. Ground chicken or turkey. Pork trimmed of fat. All fish and seafood. Eggs. Dried beans, peas, or lentils. Unsalted nuts and seeds. Unsalted canned beans. ° °Dairy °Low-fat dairy products, such as skim or 1% milk, 2% or reduced-fat cheeses, low-fat ricotta or cottage cheese, or plain low-fat yogurt. Low-sodium or reduced-sodium cheeses. ° °Fats and Oils °Tub margarines without trans fats. Light or reduced-fat mayonnaise and salad dressings (reduced sodium). Avocado. Safflower, olive, or canola oils. Natural peanut or almond butter. ° °Other °Unsalted popcorn and pretzels. °The items listed above may not be a complete list of recommended foods or beverages. Contact your dietitian for more options. ° °WHAT FOODS ARE NOT RECOMMENDED? ° °Grains °White bread. White pasta. White rice. Refined cornbread. Bagels and croissants. Crackers that contain trans fat. ° °Vegetables °Creamed or fried vegetables. Vegetables in a cheese sauce. Regular canned vegetables. Regular canned tomato sauce and paste. Regular tomato and vegetable juices. ° °  Fruits °Dried fruits. Canned fruit in light or heavy syrup. Fruit juice. ° °Meat and Other Protein Products °Fatty cuts of meat. Ribs, chicken wings, bacon, sausage,  bologna, salami, chitterlings, fatback, hot dogs, bratwurst, and packaged luncheon meats. Salted nuts and seeds. Canned beans with salt. ° °Dairy °Whole or 2% milk, cream, half-and-half, and cream cheese. Whole-fat or sweetened yogurt. Full-fat cheeses or blue cheese. Nondairy creamers and whipped toppings. Processed cheese, cheese spreads, or cheese curds. ° °Condiments °Onion and garlic salt, seasoned salt, table salt, and sea salt. Canned and packaged gravies. Worcestershire sauce. Tartar sauce. Barbecue sauce. Teriyaki sauce. Soy sauce, including reduced sodium. Steak sauce. Fish sauce. Oyster sauce. Cocktail sauce. Horseradish. Ketchup and mustard. Meat flavorings and tenderizers. Bouillon cubes. Hot sauce. Tabasco sauce. Marinades. Taco seasonings. Relishes. ° °Fats and Oils °Butter, stick margarine, lard, shortening, ghee, and bacon fat. Coconut, palm kernel, or palm oils. Regular salad dressings. ° °Other °Pickles and olives. Salted popcorn and pretzels. ° °The items listed above may not be a complete list of foods and beverages to avoid. Contact your dietitian for more information. ° °WHERE CAN I FIND MORE INFORMATION? °[employer], [employer], [employer]: www.nhlbi.nih.gov/health/health-topics/topics/dash/ °Document Released: 08/11/2011 Document Revised: 01/06/2014 Document Reviewed: 06/26/2013 °[employer]® Patient Information ©2015 [employer]. This information is not intended to replace advice given to you by your health care provider. Make sure you discuss any questions you have with your health care provider. ° ° °I think that you would greatly benefit from seeing a nutritionist.  If you are interested, please call Dr. Sykes at [phone] to schedule an appointment. ° °Continue to monitor your blood sugars as we discussed and record them. Bring the log to your next appointment.  Take your medications as directed.   ° °Goal Blood glucose:  °  Fasting (before meals) = 80 to 130 °  Within 2 hours  of eating = less than 180 ° ° °Understanding your Hemoglobin A1c: ° ° ° ° °Diabetes Mellitus and Nutrition ° ° ° °I think that you would greatly benefit from seeing a nutritionist. If this is something you are interested in, please call Dr Sykes at [phone] to schedule an appointment. ° ° °When you have diabetes (diabetes mellitus), it is very important to have healthy eating habits because your blood sugar (glucose) levels are greatly affected by what you eat and drink. Eating healthy foods in the appropriate amounts, at about the same times every day, can help you: °Control your blood glucose. °Lower your risk of heart disease. °Improve your blood pressure. °Reach or maintain a healthy weight. ° °Every person with diabetes is different, and each person has different needs for a meal plan. Your health care provider may recommend that you work with a diet and nutrition specialist (dietitian) to make a meal plan that is best for you. Your meal plan may vary depending on factors such as: °The calories you need. °The medicines you take. °Your weight. °Your blood glucose, blood pressure, and cholesterol levels. °Your activity level. °Other health conditions you have, such as heart or kidney disease. ° °How do carbohydrates affect me? °Carbohydrates affect your blood glucose level more than any other type of food. Eating carbohydrates naturally increases the amount of glucose in your blood. Carbohydrate counting is a method for keeping track of how many carbohydrates you eat. Counting carbohydrates is important to keep your blood glucose at a healthy level, especially if you use insulin or take certain oral diabetes medicines. °It is important   to know how many carbohydrates you can safely have in each meal. This is different for every person. Your dietitian can help you calculate how many carbohydrates you should have at each meal and for snack. °Foods that contain carbohydrates include: °Bread, cereal, rice, pasta,  and crackers. °Potatoes and corn. °Peas, beans, and lentils. °Milk and yogurt. °Fruit and juice. °Desserts, such as cakes, cookies, ice cream, and candy. ° °How does alcohol affect me? °Alcohol can cause a sudden decrease in blood glucose (hypoglycemia), especially if you use insulin or take certain oral diabetes medicines. Hypoglycemia can be a life-threatening condition. Symptoms of hypoglycemia (sleepiness, dizziness, and confusion) are similar to symptoms of having too much alcohol. °If your health care provider says that alcohol is safe for you, follow these guidelines: °Limit alcohol intake to no more than 1 drink per day for nonpregnant women and 2 drinks per day for men. One drink equals 12 oz of beer, 5 oz of wine, or 1½ oz of hard liquor. °Do not drink on an empty stomach. °Keep yourself hydrated with water, diet soda, or unsweetened iced tea. °Keep in mind that regular soda, juice, and other mixers may contain a lot of sugar and must be counted as carbohydrates. ° °What are tips for following this plan? ° °Reading food labels °Start by checking the serving size on the label. The amount of calories, carbohydrates, fats, and other nutrients listed on the label are based on one serving of the food. Many foods contain more than one serving per package. °Check the total grams (g) of carbohydrates in one serving. You can calculate the number of servings of carbohydrates in one serving by dividing the total carbohydrates by 15. For example, if a food has 30 g of total carbohydrates, it would be equal to 2 servings of carbohydrates. °Check the number of grams (g) of saturated and trans fats in one serving. Choose foods that have low or no amount of these fats. °Check the number of milligrams (mg) of sodium in one serving. Most people should limit total sodium intake to less than 2,300 mg per day. °Always check the nutrition information of foods labeled as "low-fat" or "nonfat". These foods may be higher in added  sugar or refined carbohydrates and should be avoided. °Talk to your dietitian to identify your daily goals for nutrients listed on the label. ° °Shopping °Avoid buying canned, premade, or processed foods. These foods tend to be high in fat, sodium, and added sugar. °Shop around the outside edge of the grocery store. This includes fresh fruits and vegetables, bulk grains, fresh meats, and fresh dairy. ° °Cooking °Use low-heat cooking methods, such as baking, instead of high-heat cooking methods like deep frying. °Cook using healthy oils, such as olive, canola, or sunflower oil. °Avoid cooking with butter, cream, or high-fat meats. ° °Meal planning °Eat meals and snacks regularly, preferably at the same times every day. Avoid going long periods of time without eating. °Eat foods high in fiber, such as fresh fruits, vegetables, beans, and whole grains. Talk to your dietitian about how many servings of carbohydrates you can eat at each meal. °Eat 4-6 ounces of lean protein each day, such as lean meat, chicken, fish, eggs, or tofu. 1 ounce is equal to 1 ounce of meat, chicken, or fish, 1 egg, or 1/4 cup of tofu. °Eat some foods each day that contain healthy fats, such as avocado, nuts, seeds, and fish. ° °Lifestyle ° °Check your blood glucose regularly. °Exercise at least   30 minutes 5 or more days each week, or as told by your health care provider. °Take medicines as told by your health care provider. °Do not use any products that contain nicotine or tobacco, such as cigarettes and e-cigarettes. If you need help quitting, ask your health care provider. °Work with a counselor or diabetes educator to identify strategies to manage stress and any emotional and social challenges. ° °What are some questions to ask my health care provider? °Do I need to meet with a diabetes educator? °Do I need to meet with a dietitian? °What number can I call if I have questions? °When are the best times to check my blood glucose? ° °Where to  find more information: °American Diabetes Association: diabetes.org/food-and-fitness/food °Academy of Nutrition and Dietetics: www.eatright.org/resources/health/diseases-and-conditions/diabetes °National Institute of Diabetes and Digestive and Kidney Diseases (NIH): www.niddk.nih.gov/health-information/diabetes/overview/diet-eating-physical-activity ° °Summary °A healthy meal plan will help you control your blood glucose and maintain a healthy lifestyle. °Working with a diet and nutrition specialist (dietitian) can help you make a meal plan that is best for you. °Keep in mind that carbohydrates and alcohol have immediate effects on your blood glucose levels. It is important to count carbohydrates and to use alcohol carefully. °This information is not intended to replace advice given to you by your health care provider. Make sure you discuss any questions you have with your health care provider. °Document Released: 05/19/2005 Document Revised: 09/26/2016 Document Reviewed: 09/26/2016 °[employer] Interactive Patient Education © 2018 [employer] ° ° ° °

## 2023-02-21 NOTE — Progress Notes (Signed)
Subjective:  Patient ID: Randy Sanchez, male    DOB: 07-09-1956, 67 y.o.   MRN: [id number]  Patient Care Team: Sonny Masters, FNP as PCP - General (Family Medicine) Chilton Si, MD as PCP - Cardiology (Cardiology)   Chief Complaint:  Diabetes (3 month follow up /)   HPI: Randy Sanchez is a 67 y.o. male presenting on 02/21/2023 for Diabetes (3 month follow up /)  Diabetes Pertinent negatives for diabetes include no chest pain, no fatigue, no polydipsia, no polyphagia and no polyuria.   1. Hypertension associated with type 2 diabetes mellitus (HCC) Complaint with medications and denies associated side effects. No chest pain, leg swelling, weakness, confusion, headaches, or visual changes. Takes medication s as directed..   BP Readings from Last 3 Encounters:  02/21/23 134/74  12/23/22 (!) 160/100  11/18/22 (!) 148/76    2. Type 2 diabetes mellitus with other specified complication, without long-term current use of insulin (HCC) Pt states he has been getting Rybelsus samples as his diabetes is not controlled and he has significant cardiovascular disease. Pt "unsure" if he has been checking blood sugar at home. Does not keep a blood sugar log. Denies any low sugar episodes. He is extremely needle phobic and will not give self injections. Does not have anyone who can do this for him. Pt has extensive cardiovascular disease and history. Pt states he has never sen a podiatrist.  Pt weight is stable for last 3 visits.   2. Hypertension associated with type 2 diabetes mellitus (HCC) Complaint with medications and denies associated side effects. No chest pain, leg swelling, weakness, confusion, headaches, or visual changes. Takes medication s as directed..   BP Readings from Last 3 Encounters:  02/21/23 134/74  12/23/22 (!) 160/100  11/18/22 (!) 148/76     3. Vitamin D deficiency Pt not supplementing Vitamin D. Infrequent intake of exogenous dietary sources of vitamin D.     4. Mid calf wound.   States been on calf for 3-4 years flares up occasionally and hurts. above level of boot . Denies drainage bleeding. Pt states it usually goes away on its own. Denies any wound care or covering    5. Coronary artery disease of native artery of native heart with stable angina pectoris (HCC) 6.. Chronic systolic heart failure (HCC) On Entresto, Coreg, Imdur, Rybelsus, statin and ASA. Tolerating well. Followed by cardiology on a regular basis, has upcoming appointment. Denies chest pain, leg swelling, weakness, confusion, shortness of breath, PND, or orthopnea.   7. CKD stage 3 due to type 2 diabetes mellitus (HCC) Pt unsure if he is taking [place]. States he has had a hard time getting it in the past. Denies weakness, confusion, decreased urine output, or swelling. Pt followed by renal and last appointment  was in april  Relevant past medical, surgical, family, and social history reviewed and updated as indicated.  Allergies and medications reviewed and updated. Data reviewed: Chart in Epic.   Past Medical History:  Diagnosis Date   Arthritis    Chronic headaches    Diabetes mellitus type II, non insulin dependent (HCC) 07/28/2021   HTN, goal below 130/80 07/28/2021   Hyperlipidemia associated with type 2 diabetes mellitus (HCC), goal LDL < 70 07/28/2021   Ischemic cardiomyopathy 07/28/2021   NSTEMI (non-ST elevated myocardial infarction) (HCC) 07/27/2021   Tobacco abuse 12/20/2021    Past Surgical History:  Procedure Laterality Date   CORONARY STENT INTERVENTION N/A 07/27/2021   Procedure: CORONARY STENT  INTERVENTION;  Surgeon: Corky Crafts, MD;  Location: [facility] INVASIVE CV LAB;  Service: Cardiovascular;  Laterality: N/A;   LEFT HEART CATH AND CORONARY ANGIOGRAPHY N/A 07/27/2021   Procedure: LEFT HEART CATH AND CORONARY ANGIOGRAPHY;  Surgeon: Corky Crafts, MD;  Location:  [facility] INVASIVE CV LAB;  Service: Cardiovascular;  Laterality: N/A;    Social  History   Socioeconomic History   Marital status: Widowed    Spouse name: Not on file   Number of children: 0   Years of education: Not on file   Highest education level: Not on file  Occupational History   Occupation: retired  Tobacco Use   Smoking status: Former    Packs/day: .25    Types: Cigarettes    Quit date: 01/31/2022    Years since quitting: 1.0   Smokeless tobacco: Never  Vaping Use   Vaping Use: Never used  Substance and Sexual Activity   Alcohol use: Yes    Alcohol/week: 2.0 standard drinks of alcohol    Types: 2 Shots of liquor per week    Comment: 10-15 shot of Ree Kida daniel every other day   Drug use: Not Currently    Types: Cocaine, Marijuana    Comment: last week   Sexual activity: Not Currently  Other Topics Concern   Not on file  Social History Narrative   Not on file   Social Determinants of Health   Financial Resource Strain: Not on file  Food Insecurity: Not on file  Transportation Needs: Not on file  Physical Activity: Not on file  Stress: Not on file  Social Connections: Not on file  Intimate Partner Violence: Not on file    Outpatient Encounter Medications as of 02/21/2023  Medication Sig   aspirin EC 81 MG tablet Take 81 mg by mouth daily. Swallow whole.   atorvastatin (LIPITOR) 80 MG tablet TAKE ONE (1) TABLET BY MOUTH EVERY DAY   Blood Pressure KIT CHECK YOUR BLOOD PRESSURE TWICE A DAY   carvedilol (COREG) 12.5 MG tablet Take 1 tablet (12.5 mg total) by mouth 2 (two) times daily with a meal.   dapagliflozin propanediol (FARXIGA) 10 MG TABS tablet Take 1 tablet (10 mg total) by mouth daily before breakfast.   diclofenac Sodium (VOLTAREN) 1 % GEL APPLY 2 GRAMS 4 TIMES DAILY   ezetimibe (ZETIA) 10 MG tablet Take 1 tablet (10 mg total) by mouth daily.   nitroGLYCERIN (NITROSTAT) 0.4 MG SL tablet Place 1 tablet (0.4 mg total) under the tongue every 5 (five) minutes x 3 doses as needed for chest pain.   sacubitril-valsartan (ENTRESTO) 49-51 MG  Take 1 tablet by mouth 2 (two) times daily.   Semaglutide (RYBELSUS) 7 MG TABS Take 1 tablet (7 mg total) by mouth daily.   sildenafil (VIAGRA) 50 MG tablet Take 1 tablet (50 mg total) by mouth daily as needed for erectile dysfunction.   No facility-administered encounter medications on file as of 02/21/2023.    No Known Allergies  Review of Systems  Constitutional: Negative.  Negative for activity change, appetite change, fatigue and unexpected weight change.  HENT: Negative.    Eyes:  Positive for visual disturbance (sometimes takes a while to see well in AM).  Cardiovascular:  Negative for chest pain, palpitations and leg swelling.  Endocrine: Negative for polydipsia, polyphagia and polyuria.  Genitourinary:  Negative for decreased urine volume and difficulty urinating.  Musculoskeletal: Negative.   Skin: Negative.   Allergic/Immunologic: Negative.   Neurological: Negative.   Hematological: Negative.  Psychiatric/Behavioral:  Negative for self-injury and suicidal ideas.   All other systems reviewed and are negative.       Objective:  BP 134/74   Pulse 81   Temp (!) 97.5 F (36.4 C) (Temporal)   Ht 5\' 5"  (1.651 m)   Wt 160 lb 9.6 oz (72.8 kg)   SpO2 97%   BMI 26.73 kg/m    Wt Readings from Last 3 Encounters:  02/21/23 160 lb 9.6 oz (72.8 kg)  12/23/22 161 lb (73 kg)  11/24/22 158 lb (71.7 kg)    Physical Exam Vitals and nursing note reviewed.  Constitutional:      Appearance: He is normal weight.  HENT:     Head: Normocephalic and atraumatic.     Nose: Nose normal.     Mouth/Throat:     Mouth: Mucous membranes are moist.     Pharynx: Oropharynx is clear.  Eyes:     Extraocular Movements: Extraocular movements intact.     Conjunctiva/sclera: Conjunctivae normal.     Pupils: Pupils are equal, round, and reactive to light.  Cardiovascular:     Rate and Rhythm: Normal rate and regular rhythm.     Heart sounds: Normal heart sounds.  Pulmonary:     Effort:  Pulmonary effort is normal.     Breath sounds: Normal breath sounds.  Abdominal:     General: Abdomen is flat.     Palpations: Abdomen is soft.  Musculoskeletal:        General: Normal range of motion.     Cervical back: Normal range of motion.  Skin:    General: Skin is warm and dry.     Capillary Refill: Capillary refill takes less than 2 seconds.     Findings: Lesion present.       Neurological:     General: No focal deficit present.     Mental Status: He is alert and oriented to person, place, and time. Mental status is at baseline.  Psychiatric:        Mood and Affect: Mood normal.        Behavior: Behavior normal.        Thought Content: Thought content normal.        Judgment: Judgment normal.     Results for orders placed or performed in visit on 11/18/22  CMP14+EGFR  Result Value Ref Range   Glucose 155 (H) 70 - 99 mg/dL   BUN 14 8 - 27 mg/dL   Creatinine, Ser 1.61 0.76 - 1.27 mg/dL   eGFR 77 >09 UE/AVW/0.98   BUN/Creatinine Ratio 13 10 - 24   Sodium 140 134 - 144 mmol/L   Potassium 4.8 3.5 - 5.2 mmol/L   Chloride 103 96 - 106 mmol/L   CO2 19 (L) 20 - 29 mmol/L   Calcium 9.5 8.6 - 10.2 mg/dL   Total Protein 7.1 6.0 - 8.5 g/dL   Albumin 4.3 3.9 - 4.9 g/dL   Globulin, Total 2.8 1.5 - 4.5 g/dL   Albumin/Globulin Ratio 1.5 1.2 - 2.2   Bilirubin Total 0.2 0.0 - 1.2 mg/dL   Alkaline Phosphatase 81 44 - 121 IU/L   AST 23 0 - 40 IU/L   ALT 26 0 - 44 IU/L  Bayer DCA Hb A1c Waived  Result Value Ref Range   HB A1C (BAYER DCA - WAIVED) 7.8 (H) 4.8 - 5.6 %  VITAMIN D 25 Hydroxy (Vit-D Deficiency, Fractures)  Result Value Ref Range   Vit D, 25-Hydroxy 22.6 (  L) 30.0 - 100.0 ng/mL  Microalbumin / creatinine urine ratio  Result Value Ref Range   Creatinine, Urine 111.0 Not Estab. mg/dL   Microalbumin, Urine 161.0 Not Estab. ug/mL   Microalb/Creat Ratio 260 (H) 0 - 29 mg/g creat       Pertinent labs & imaging results that were available during my care of the patient  were reviewed by me and considered in my medical decision making.  Assessment & Plan:  Latavion was seen today for diabetes.  Diagnoses and all orders for this visit:  Hypertension associated with type 2 diabetes mellitus (HCC) BP well controlled. Changes were not made in regimen today. Goal BP is 134/74 Pt aware to report any persistent high or low readings. DASH diet and exercise encouraged. Exercise at least 150 minutes per week and increase as tolerated. Goal BMI > 25. Stress management encouraged. Avoid nicotine and tobacco product use. Avoid excessive alcohol and NSAID's. Avoid more than 2000 mg of sodium daily. Medications as prescribed. Follow up as scheduled.  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 A1c elvated 7.8% Needs GLP1 coverage due to uncontrolled diabetes and significant heart disease. Rybelsus 7mg  tablet samples provided in office today. He is followed by cardiology and has an appointment next month. Pt schedualed for eye exam later this month. Unsure of date. Reinforced importance of exam asked that Pt ask for Results to be sent to this office  -     Bayer DCA A1c waived  -     VITAMIN D 25 Hydroxy (Vit-D Deficiency, Fractures) -     Microalbumin / creatinine urine ratio -     Semaglutide (RYBELSUS) 7 MG TABS; Take 1 tablet (7 mg            total) by mouth daily. -      Start Glipizide 5mg  PO daily   Vitamin D deficiency Will check labs today and start repletion therapy if warranted.  -     VITAMIN D 25 Hydroxy (Vit-D Deficiency, Fractures)  Mid calf wound.   States been on calf for 3-4 years flares up occasionally and hurts. above level of boot . Denies  drainage bleeding. Pt states it usually goes away on its own. Denies any wound care or covering   CKD stage 3 due to type 2 diabetes mellitus (HCC) Followed by nephrology and has an appointment next month Poor historian on what medications he takes. Pt did not bring medications with him today.    Continue all other maintenance medications.  Follow up plan: Return in about 3 months (around 05/24/2023) for DM followup   Continue healthy lifestyle choices, including diet (rich in fruits, vegetables, and lean proteins, and low in salt and simple carbohydrates) and exercise (at least 30 minutes of moderate physical activity daily).  Educational handout given for DASH diet, Diabetes  The above assessment and management plan was discussed with the patient. The patient verbalized understanding of and has agreed to the management plan. Patient is aware to call the clinic if they develop any new symptoms or if symptoms persist or worsen. Patient is aware when to return to the clinic for a follow-up visit. Patient educated on  when it is appropriate to go to the emergency department.   Maryelizabeth Kaufmann NP Student [facility] [phone]   I personally was present during the history, physical exam, and medical decision-making activities of this visit and have verified that the services and findings are accurately documented in the nurse practitioner student's note.  Kari Baars, FNP-C [facility] [address] [place], [place] [address] [phone]

## 2023-02-22 LAB — CMP14+EGFR
ALT: 20 IU/L (ref 0–44)
Albumin: 3.9 g/dL (ref 3.9–4.9)
Alkaline Phosphatase: 68 IU/L (ref 44–121)
BUN/Creatinine Ratio: 11 (ref 10–24)
BUN: 16 mg/dL (ref 8–27)
CO2: 19 mmol/L — ABNORMAL LOW (ref 20–29)
Calcium: 8.7 mg/dL (ref 8.6–10.2)
Chloride: 106 mmol/L (ref 96–106)
Creatinine, Ser: 1.46 mg/dL — ABNORMAL HIGH (ref 0.76–1.27)
Globulin, Total: 2.5 g/dL (ref 1.5–4.5)
Potassium: 4.3 mmol/L (ref 3.5–5.2)
eGFR: 53 mL/min/{1.73_m2} — ABNORMAL LOW (ref >59)

## 2023-02-22 LAB — VITAMIN D 25 HYDROXY (VIT D DEFICIENCY, FRACTURES): Vit D, 25-Hydroxy: 16.8 ng/mL — ABNORMAL LOW (ref 30.0–100.0)

## 2023-02-23 ENCOUNTER — Other Ambulatory Visit: Payer: Self-pay | Admitting: Pharmacist

## 2023-02-23 DIAGNOSIS — E1169 Type 2 diabetes mellitus with other specified complication: Secondary | ICD-10-CM

## 2023-02-23 DIAGNOSIS — I5022 Chronic systolic (congestive) heart failure: Secondary | ICD-10-CM

## 2023-02-23 DIAGNOSIS — I25118 Atherosclerotic heart disease of native coronary artery with other forms of angina pectoris: Secondary | ICD-10-CM

## 2023-02-23 DIAGNOSIS — F40231 Fear of injections and transfusions: Secondary | ICD-10-CM

## 2023-02-23 MED ORDER — RYBELSUS 7 MG PO TABS
7.0000 mg | ORAL_TABLET | Freq: Every day | ORAL | 6 refills | Status: DC
Start: 2023-02-23 — End: 2023-07-04

## 2023-02-23 NOTE — Progress Notes (Signed)
   02/23/2023 Name: Randy Sanchez MRN: [id number] DOB: 02-18-1956  Chief Complaint  Patient presents with   Diabetes    Patient stable on current therapy.  A1c has improved with additional of Rybelsus.  Medicaid has not wanted to approve this medication, however no it says "no PA required".  New RX for Rybelsus 7mg  daily sent to pharmacy with notes to call me if there are issues.  Patient was given 2 months worth of 7mg  daily tablets at last PCP visit.  Denies personal and family history of Medullary thyroid cancer (MTC).  Continue all other medications as prescribed.  Will address adherence to statin/farxiga at f/u visit Farxiga PA completed as well to expedite potential augmentation of therapy.  He was previuosly prescribed Farxiga, but hasn't been able to get due to insurance.     Objective:  Lab Results  Component Value Date   HGBA1C 7.8 (H) 02/21/2023    Lab Results  Component Value Date   CREATININE 1.46 (H) 02/21/2023   BUN 16 02/21/2023   NA 140 02/21/2023   K 4.3 02/21/2023   CL 106 02/21/2023   CO2 19 (L) 02/21/2023    Lab Results  Component Value Date   CHOL 149 08/19/2022   HDL 59 08/19/2022   LDLCALC 69 08/19/2022   TRIG 120 08/19/2022   CHOLHDL 2.5 08/19/2022    Kieth Brightly, PharmD, BCACP Clinical Pharmacist, [facility]

## 2023-03-07 ENCOUNTER — Telehealth: Payer: Medicare HMO

## 2023-03-10 ENCOUNTER — Telehealth: Payer: Self-pay

## 2023-03-10 NOTE — Telephone Encounter (Signed)
Randy Sanchez (KeyJunious Silk) Rx #: [id number] Rybelsus 7MG  tablets Form [employer] Electronic PA Form Created 2 days ago Sent to Plan 4 minutes ago Plan Response 3 minutes ago Submit Clinical Questions less than a minute ago Determination Wait for Determination Please wait for [facility] NCPDP 2017 to return a determination.

## 2023-03-13 ENCOUNTER — Telehealth: Payer: Self-pay | Admitting: Family Medicine

## 2023-03-13 ENCOUNTER — Other Ambulatory Visit (HOSPITAL_COMMUNITY): Payer: Self-pay

## 2023-03-13 MED ORDER — ACCU-CHEK GUIDE VI STRP: ORAL_STRIP | 3 refills | Status: AC

## 2023-03-13 MED ORDER — ACCU-CHEK SOFTCLIX LANCETS MISC: 3 refills | Status: AC

## 2023-03-13 MED ORDER — ACCU-CHEK GUIDE VI STRP: ORAL_STRIP | 3 refills | Status: DC

## 2023-03-13 MED ORDER — ACCU-CHEK SOFTCLIX LANCETS MISC: 3 refills | Status: DC

## 2023-03-13 MED ORDER — ACCU-CHEK GUIDE ME W/DEVICE KIT: PACK | 0 refills | Status: AC

## 2023-03-13 NOTE — Telephone Encounter (Signed)
[facility] script sent for meter, test strips & lancets

## 2023-03-13 NOTE — Telephone Encounter (Signed)
Arlys John called from the pharmacy stating that the strips and lancets Rx that was sent needs to be more specific for insurance to cover.   How many times a day as needed?  Requesting to correct Rx and resend.

## 2023-03-13 NOTE — Telephone Encounter (Signed)
  Prescription Request  03/13/2023  Is this a "Controlled Substance" medicine? no  Have you seen your PCP in the last 2 weeks? no  If YES, route message to pool  -  If NO, patient needs to be scheduled for appointment.  What is the name of the medication or equipment? Needs glucose monitor, strips and lancets. Has never had one  Have you contacted your pharmacy to request a refill? no   Which pharmacy would you like this sent to? [facility]   Patient notified that their request is being sent to the clinical staff for review and that they should receive a response within 2 business days.

## 2023-03-13 NOTE — Telephone Encounter (Signed)
Patient Advocate Encounter  Prior Authorization for Rybelsus 7MG  tablets has been approved with [employer].    PA# [id number] Effective dates: 09/05/22 through 09/05/23  Per [facility] test claim, copay for 30 days supply is $0

## 2023-03-15 ENCOUNTER — Telehealth: Payer: Self-pay | Admitting: Family Medicine

## 2023-03-15 DIAGNOSIS — E119 Type 2 diabetes mellitus without complications: Secondary | ICD-10-CM | POA: Diagnosis not present

## 2023-03-15 DIAGNOSIS — H40013 Open angle with borderline findings, low risk, bilateral: Secondary | ICD-10-CM | POA: Diagnosis not present

## 2023-03-15 DIAGNOSIS — H2513 Age-related nuclear cataract, bilateral: Secondary | ICD-10-CM | POA: Diagnosis not present

## 2023-03-15 DIAGNOSIS — H35033 Hypertensive retinopathy, bilateral: Secondary | ICD-10-CM | POA: Diagnosis not present

## 2023-03-15 LAB — HM DIABETES EYE EXAM

## 2023-03-16 NOTE — Telephone Encounter (Signed)
TC to pharmacy, this has all been taken care of & pt has received

## 2023-03-20 DIAGNOSIS — N182 Chronic kidney disease, stage 2 (mild): Secondary | ICD-10-CM | POA: Diagnosis not present

## 2023-03-20 DIAGNOSIS — R6889 Other general symptoms and signs: Secondary | ICD-10-CM | POA: Diagnosis not present

## 2023-03-24 ENCOUNTER — Encounter (HOSPITAL_BASED_OUTPATIENT_CLINIC_OR_DEPARTMENT_OTHER): Payer: Self-pay | Admitting: Family

## 2023-03-24 ENCOUNTER — Ambulatory Visit (HOSPITAL_BASED_OUTPATIENT_CLINIC_OR_DEPARTMENT_OTHER): Payer: Medicare HMO | Admitting: Family

## 2023-03-24 VITALS — BP 150/88 | HR 77 | Ht 65.0 in | Wt 166.9 lb

## 2023-03-24 DIAGNOSIS — I25118 Atherosclerotic heart disease of native coronary artery with other forms of angina pectoris: Secondary | ICD-10-CM | POA: Diagnosis not present

## 2023-03-24 DIAGNOSIS — I255 Ischemic cardiomyopathy: Secondary | ICD-10-CM | POA: Diagnosis not present

## 2023-03-24 DIAGNOSIS — E785 Hyperlipidemia, unspecified: Secondary | ICD-10-CM

## 2023-03-24 DIAGNOSIS — N522 Drug-induced erectile dysfunction: Secondary | ICD-10-CM

## 2023-03-24 DIAGNOSIS — I10 Essential (primary) hypertension: Secondary | ICD-10-CM

## 2023-03-24 DIAGNOSIS — R6889 Other general symptoms and signs: Secondary | ICD-10-CM | POA: Diagnosis not present

## 2023-03-24 DIAGNOSIS — I5022 Chronic systolic (congestive) heart failure: Secondary | ICD-10-CM

## 2023-03-24 MED ORDER — CARVEDILOL 25 MG PO TABS
12.5000 mg | ORAL_TABLET | Freq: Two times a day (BID) | ORAL | 3 refills | Status: DC
Start: 2023-03-24 — End: 2023-06-23

## 2023-03-24 MED ORDER — SILDENAFIL CITRATE 100 MG PO TABS
100.0000 mg | ORAL_TABLET | Freq: Every day | ORAL | 0 refills | Status: DC | PRN
Start: 2023-03-24 — End: 2023-07-04

## 2023-03-24 NOTE — Progress Notes (Unsigned)
Cardiology Office Note:  .   Date:  03/30/2023  ID:  Randy Sanchez, DOB 1955/11/21, MRN [id number] PCP: Sonny Masters, FNP  [facility] Providers Cardiologist:  Chilton Si, MD    History of Present Illness: .   Randy Sanchez is a 67 y.o. male  with a hx of coronary disease s/p DES to Cx 07/2021, hypertension, ICM, HFrEF, hyperlipidemia, tobacco use, diabetes, OSA (declines CPAP). Last seen 12/23/2022.   He presented to the ED 07/25/2021 with chest pain with troponin peak >24,000.  Cardiac catheterization showed multivessel disease with culprit lesion being OM1 treated with PTCA.  Also 80% circumflex treated with DES.  Echo during admission showed LVEF 40-45%.  He had not been on any medications since 2018.  Jardiance was started for diabetes.  Entresto, carvedilol started for HFrEF and HTN.  Aspirin, statin, Imdur, beta-blocker started for coronary disease. Repeat echocardiogram 11/2021 LVEF 40-45%. At visit 12/20/21 with Dr. Duke Salvia BP not at goal, [facility] increased.    At telehealth visit 06/21/22 Imdur stopped due to ED. As no recurrent chest pain, he was given Rx for Sildenafil. Last seen 12/23/2022 with BP not at goal and carvedilol was increased to 12.5 mg twice daily.   Presents today for follow-up. Continue to walk for exercise.  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  Eating mostly at home and his sister brings him meals as well. Tells me he is not smoking. BP at home usually 150/80. No orthopnea, PND, edema.  On review notes he is still taking Imdur and we discussed that he cannot take with sildenafil so we will plan to discontinue.  Does not feel sildenafil is working well and interested in trying alternative for increased dose.  03/20/23 creatinine 1.2, gfr 67, K4.9Na 138  ROS: Please see the history of present illness.    All other systems reviewed and are negative.   Studies Reviewed: .        Cardiac Studies & Procedures   CARDIAC CATHETERIZATION  CARDIAC  CATHETERIZATION 07/27/2021  Narrative   1st Mrg-1 lesion is 100% stenosed.  Releatively small vessel. Culprit for MI.  Balloon angioplasty was performed using a BALLN SAPPHIRE 2.0X15.   Post intervention, there is a 25% residual stenosis.   1st Mrg-2 lesion is 75% stenosed.  Balloon angioplasty was performed using a BALLN SAPPHIRE 2.0X15.   Post intervention, there is a 25% residual stenosis.   Ost LAD to Prox LAD lesion is 25% stenosed.   Mid Cx lesion is 80% stenosed.   A drug-eluting stent was successfully placed using a STENT ONYX FRONTIER 3.0X15, postdilated to 3.25 mm.   Post intervention, there is a 0% residual stenosis.   2nd Diag lesion is 80% stenosed.   RPDA-2 lesion is 50% stenosed.   RPDA-1 lesion is 50% stenosed.   Prox RCA lesion is 25% stenosed.   There is mild to moderate left ventricular systolic dysfunction.   LV end diastolic pressure is low.   The left ventricular ejection fraction is 35-45% by visual estimate.   There is no aortic valve stenosis.  I stressed the importance of dual antiplatelet therapy.  He will need aggressive secondary prevention including avoidance of any illicit substances, or tobacco.  He will need aggressive blood pressure control.  High-dose statin.  Findings Coronary Findings Diagnostic  Dominance: Right  Left Anterior Descending Ost LAD to Prox LAD lesion is 25% stenosed.  Second Diagonal Branch 2nd Diag lesion is 80% stenosed.  Left Circumflex Mid Cx  lesion is 80% stenosed.  First Obtuse Marginal Branch 1st Mrg-1 lesion is 100% stenosed. Vessel is the culprit lesion. The lesion is type C. 1st Mrg-2 lesion is 75% stenosed.  Right Coronary Artery Vessel is large. There is mild diffuse disease throughout the vessel. Prox RCA lesion is 25% stenosed.  Right Posterior Descending Artery RPDA-1 lesion is 50% stenosed. RPDA-2 lesion is 50% stenosed.  Intervention  Mid Cx lesion Stent CATH LAUNCHER 6FR EBU 3.5 SH guide catheter  was inserted. Lesion crossed with guidewire using a WIRE ASAHI PROWATER 180CM. Pre-stent angioplasty was not performed. A drug-eluting stent was successfully placed using a STENT ONYX FRONTIER 3.0X15. Stent strut is well apposed. Post-stent angioplasty was performed using a BALLN SAPPHIRE [place] 3.25X10. Repeat PTCA of the OM was done after the stent was placed. Post-Intervention Lesion Assessment The intervention was successful. Pre-interventional TIMI flow is 3. Post-intervention TIMI flow is 3. No complications occurred at this lesion. There is a 0% residual stenosis post intervention.  1st Mrg-1 lesion Angioplasty CATH LAUNCHER 6FR EBU 3.5 SH guide catheter was inserted. WIRE HI TORQ BMW 190CM guidewire used to cross lesion. Balloon angioplasty was performed using a BALLN SAPPHIRE 2.0X15. Post-Intervention Lesion Assessment The intervention was successful. Pre-interventional TIMI flow is 0. Post-intervention TIMI flow is 3. No complications occurred at this lesion. There is a 25% residual stenosis post intervention.  1st Mrg-2 lesion Angioplasty CATH LAUNCHER 6FR EBU 3.5 SH guide catheter was inserted. WIRE HI TORQ BMW 190CM guidewire used to cross lesion. Balloon angioplasty was performed using a BALLN SAPPHIRE 2.0X15. Post-Intervention Lesion Assessment The intervention was successful. Pre-interventional TIMI flow is 3. Post-intervention TIMI flow is 3. No complications occurred at this lesion. Multiple doses of IC nitroglycerin helped improve the appearance of the OM. There is a 25% residual stenosis post intervention.     ECHOCARDIOGRAM  ECHOCARDIOGRAM COMPLETE 11/15/2021  Narrative ECHOCARDIOGRAM REPORT    Patient Name:   Randy Sanchez Date of Exam: 11/15/2021 Medical Rec #:  [id number]      Height:       67.0 in Accession #:    [id number]     Weight:       148.0 lb Date of Birth:  04/18/1956       BSA:          1.779 m Patient Age:    65 years       BP:           115/72  mmHg Patient Gender: M              HR:           79 bpm. [facility]  Procedure: 2D Echo, Cardiac Doppler and Color Doppler  Indications:    I25.5 (ICD-10-CM) - Ischemic cardiomyopathy I50.22 (ICD-10-CM) - Chronic systolic heart failure (HCC)  History:        Patient has prior history of Echocardiogram examinations, most recent 07/27/2021. Cardiomyopathy, Previous Myocardial Infarction and CAD; Risk Factors:Hypertension, Diabetes and Dyslipidemia.  Sonographer:    Celesta Gentile RCS Referring Phys: [id number] Casimer Russett S Eli Adami  IMPRESSIONS   1. Left ventricular ejection fraction, by estimation, is 40 to 45%. The left ventricle has mildly decreased function. The left ventricle demonstrates regional wall motion abnormalities (see scoring diagram/findings for description). The mid-to-distal anterolateral and inferolateral LV segments are hypokinetic. There is mild concentric left ventricular hypertrophy. Left ventricular diastolic parameters are consistent with Grade I diastolic dysfunction (impaired relaxation). 2. Right ventricular systolic function  is normal. The right ventricular size is normal. Tricuspid regurgitation signal is inadequate for assessing PA pressure. 3. The pericardial effusion is posterior to the left ventricle. 4. The mitral valve is normal in structure. Mild mitral valve regurgitation. 5. The aortic valve is tricuspid. There is mild calcification of the aortic valve. There is mild thickening of the aortic valve. Aortic valve regurgitation is not visualized. Aortic valve sclerosis/calcification is present, without any evidence of aortic stenosis. 6. The inferior vena cava is normal in size with greater than 50% respiratory variability, suggesting right atrial pressure of 3 mmHg.  Comparison(s): Compared to prior study on 07/2021, there is no significant change.  FINDINGS Left Ventricle: Left ventricular ejection fraction, by estimation, is 40 to 45%. The  left ventricle has mildly decreased function. The left ventricle demonstrates regional wall motion abnormalities. The mid-to-distal anterolateral and inferolateral walls are hypokinetic. The left ventricular internal cavity size was normal in size. There is mild concentric left ventricular hypertrophy. Left ventricular diastolic parameters are consistent with Grade I diastolic dysfunction (impaired relaxation).  Right Ventricle: The right ventricular size is normal. No increase in right ventricular wall thickness. Right ventricular systolic function is normal. Tricuspid regurgitation signal is inadequate for assessing PA pressure.  Left Atrium: Left atrial size was normal in size.  Right Atrium: Right atrial size was normal in size.  Pericardium: Trivial pericardial effusion is present. The pericardial effusion is posterior to the left ventricle.  Mitral Valve: The mitral valve is normal in structure. There is mild thickening of the mitral valve leaflet(s). There is mild calcification of the mitral valve leaflet(s). Mild mitral valve regurgitation.  Tricuspid Valve: The tricuspid valve is normal in structure. Tricuspid valve regurgitation is trivial.  Aortic Valve: The aortic valve is tricuspid. There is mild calcification of the aortic valve. There is mild thickening of the aortic valve. Aortic valve regurgitation is not visualized. Aortic valve sclerosis/calcification is present, without any evidence of aortic stenosis.  Pulmonic Valve: The pulmonic valve was normal in structure. Pulmonic valve regurgitation is trivial.  Aorta: The aortic root is normal in size and structure.  Venous: The inferior vena cava is normal in size with greater than 50% respiratory variability, suggesting right atrial pressure of 3 mmHg.  IAS/Shunts: The atrial septum is grossly normal.   LEFT VENTRICLE PLAX 2D LVIDd:         4.50 cm   Diastology LVIDs:         3.40 cm   LV e' medial:    3.37 cm/s LV PW:          1.30 cm   LV E/e' medial:  15.9 LV IVS:        1.20 cm   LV e' lateral:   6.96 cm/s LVOT diam:     1.90 cm   LV E/e' lateral: 7.7 LV SV:         42 LV SV Index:   24 LVOT Area:     2.84 cm   RIGHT VENTRICLE RV S prime:     12.20 cm/s TAPSE (M-mode): 1.3 cm  LEFT ATRIUM           Index        RIGHT ATRIUM           Index LA diam:      3.20 cm 1.80 cm/m   RA Area:     11.30 cm LA Vol (A4C): 23.8 ml 13.38 ml/m  RA Volume:   23.30 ml  13.09 ml/m AORTIC VALVE LVOT Vmax:   73.10 cm/s LVOT Vmean:  49.700 cm/s LVOT VTI:    0.148 m  AORTA Ao Root diam: 3.00 cm  MITRAL VALVE MV Area (PHT): 3.31 cm    SHUNTS MV Decel Time: 229 msec    Systemic VTI:  0.15 m MV E velocity: 53.50 cm/s  Systemic Diam: 1.90 cm MV A velocity: 79.90 cm/s MV E/A ratio:  0.67  Laurance Flatten MD Electronically signed by Laurance Flatten MD Signature Date/Time: 11/15/2021/4:48:27 PM    Final             Risk Assessment/Calculations:            Physical Exam:   VS:  BP (!) 150/88 (BP Location: Left Arm)   Pulse 77   Ht 5\' 5"  (1.651 m)   Wt 166 lb 14.4 oz (75.7 kg)   SpO2 99%   BMI 27.77 kg/m    Wt Readings from Last 3 Encounters:  03/24/23 166 lb 14.4 oz (75.7 kg)  02/21/23 160 lb 9.6 oz (72.8 kg)  12/23/22 161 lb (73 kg)    GEN: Well nourished, well developed in no acute distress NECK: No JVD; No carotid bruits CARDIAC: RRR, no murmurs, rubs, gallops RESPIRATORY:  Clear to auscultation without rales, wheezing or rhonchi  ABDOMEN: Soft, non-tender, non-distended EXTREMITIES:  No edema; No deformity   ASSESSMENT AND PLAN: .    CAD s/p DES Cx 07/2021 / HLD, LDL goal <70 / ED - Continue GDMT aspirin, Brilinta, Coreg, Jardiance. Imdur previously stopped due to ED however has inadvertently been taking-will discontinue. Stable with no anginal symptoms. No indication for ischemic evaluation.  Recommend aiming for 150 minutes of moderate intensity activity per week and following a  heart healthy diet.    08/2022 LDL 69   DM2 - Continue to follow with PCP.    HTN - BP not at goal. Increase Coreg to 25mg  BID. Continue Entresto 49-51mg  BID. Could further increase Entresto at follow up vs addition of Spironolactone. Discussed to monitor BP at home at least 2 hours after medications and sitting for 5-10 minutes. Discontinue Imdur due to utilization of Sildenafil.   ED - Increase Sildenafil to 100mg . If not effective, consider Cialise.    ICM / HFrEF - 11/2021 LVEF 40-45%. Euvolemic and well compensated on exam.  Continue GDMT Coreg, Jardiance, Entresto. Consider Spironolactone at follow up. Careful avoidance of polypharmacy. Low sodium diet, fluid restriction <2L, and daily weights encouraged. Educated to contact our office for weight gain of 2 lbs overnight or 5 lbs in one week.    Tobacco use - Continue smoking cessation encouraged. Recommend utilization of 1800QUITNOW.         Dispo: follow up in 3 months   Signed, Alver Sorrow, NP

## 2023-03-24 NOTE — Patient Instructions (Addendum)
Medication Instructions:  Your physician has recommended you make the following change in your medication:   STOP Isosorbide  CHANGE Sildenafil to 100mg  as needed for erectile dysfunction You may take two of your 50mg  tablets to use them up  CHANGE Carvedilol to 25mg  twice daily You may take two of your 12.5mg  tablet to use them up  *If you need a refill on your cardiac medications before your next appointment, please call your pharmacy*  Follow-Up: At [facility], you and your health needs are our priority.  As part of our continuing mission to provide you with exceptional heart care, we have created designated Provider Care Teams.  These Care Teams include your primary Cardiologist (physician) and Advanced Practice Providers (APPs -  Physician Assistants and Nurse Practitioners) who all work together to provide you with the care you need, when you need it.  We recommend signing up for the patient portal called "MyChart".  Sign up information is provided on this After Visit Summary.  MyChart is used to connect with patients for Virtual Visits (Telemedicine).  Patients are able to view lab/test results, encounter notes, upcoming appointments, etc.  Non-urgent messages can be sent to your provider as well.   To learn more about what you can do with MyChart, go to [email or web address].    Your next appointment:   3 month(s)  Provider:   Chilton Si, MD or Gillian Shields, NP    Other Instructions  Heart Healthy Diet Recommendations: A low-salt diet is recommended. Meats should be grilled, baked, or boiled. Avoid fried foods. Focus on lean protein sources like fish or chicken with vegetables and fruits. The American Heart Association is a Chief Technology Officer!  American Heart Association Diet and Lifeystyle Recommendations   Exercise recommendations: The American Heart Association recommends 150 minutes of moderate intensity exercise weekly. Try 30 minutes of moderate  intensity exercise 4-5 times per week. This could include walking, jogging, or swimming.

## 2023-03-29 ENCOUNTER — Telehealth: Payer: Self-pay | Admitting: Family Medicine

## 2023-03-29 NOTE — Telephone Encounter (Signed)
Patient is wanting a machine to check his blood sugar [facility] in [place].

## 2023-03-30 ENCOUNTER — Other Ambulatory Visit: Payer: Self-pay | Admitting: Family Medicine

## 2023-03-30 DIAGNOSIS — N182 Chronic kidney disease, stage 2 (mild): Secondary | ICD-10-CM | POA: Diagnosis not present

## 2023-03-30 DIAGNOSIS — E1122 Type 2 diabetes mellitus with diabetic chronic kidney disease: Secondary | ICD-10-CM | POA: Diagnosis not present

## 2023-03-30 DIAGNOSIS — E1169 Type 2 diabetes mellitus with other specified complication: Secondary | ICD-10-CM

## 2023-03-30 DIAGNOSIS — I129 Hypertensive chronic kidney disease with stage 1 through stage 4 chronic kidney disease, or unspecified chronic kidney disease: Secondary | ICD-10-CM | POA: Diagnosis not present

## 2023-03-30 DIAGNOSIS — E1129 Type 2 diabetes mellitus with other diabetic kidney complication: Secondary | ICD-10-CM | POA: Diagnosis not present

## 2023-03-30 DIAGNOSIS — R6889 Other general symptoms and signs: Secondary | ICD-10-CM | POA: Diagnosis not present

## 2023-03-30 MED ORDER — BLOOD GLUCOSE TEST VI STRP: 1.0000 | ORAL_STRIP | Freq: Three times a day (TID) | 0 refills | Status: AC

## 2023-03-30 MED ORDER — LANCETS MISC. MISC: 1.0000 | Freq: Three times a day (TID) | 0 refills | Status: AC

## 2023-03-30 MED ORDER — BLOOD GLUCOSE MONITORING SUPPL DEVI
1.0000 | Freq: Three times a day (TID) | 0 refills | Status: AC
Start: 2023-03-30 — End: ?

## 2023-03-30 MED ORDER — LANCET DEVICE MISC
1.0000 | Freq: Three times a day (TID) | 0 refills | Status: AC
Start: 2023-03-30 — End: 2023-04-29

## 2023-03-30 NOTE — Telephone Encounter (Signed)
This has been sent

## 2023-05-24 ENCOUNTER — Ambulatory Visit: Payer: Medicaid Other | Admitting: Family Medicine

## 2023-05-26 ENCOUNTER — Ambulatory Visit: Payer: Medicaid Other | Admitting: Family Medicine

## 2023-05-26 DIAGNOSIS — R6889 Other general symptoms and signs: Secondary | ICD-10-CM | POA: Diagnosis not present

## 2023-06-20 DIAGNOSIS — E1129 Type 2 diabetes mellitus with other diabetic kidney complication: Secondary | ICD-10-CM | POA: Diagnosis not present

## 2023-06-20 DIAGNOSIS — E1122 Type 2 diabetes mellitus with diabetic chronic kidney disease: Secondary | ICD-10-CM | POA: Diagnosis not present

## 2023-06-20 DIAGNOSIS — D631 Anemia in chronic kidney disease: Secondary | ICD-10-CM | POA: Diagnosis not present

## 2023-06-20 DIAGNOSIS — I5042 Chronic combined systolic (congestive) and diastolic (congestive) heart failure: Secondary | ICD-10-CM | POA: Diagnosis not present

## 2023-06-20 DIAGNOSIS — I129 Hypertensive chronic kidney disease with stage 1 through stage 4 chronic kidney disease, or unspecified chronic kidney disease: Secondary | ICD-10-CM | POA: Diagnosis not present

## 2023-06-20 DIAGNOSIS — R809 Proteinuria, unspecified: Secondary | ICD-10-CM | POA: Diagnosis not present

## 2023-06-20 DIAGNOSIS — N189 Chronic kidney disease, unspecified: Secondary | ICD-10-CM | POA: Diagnosis not present

## 2023-06-20 DIAGNOSIS — R6889 Other general symptoms and signs: Secondary | ICD-10-CM | POA: Diagnosis not present

## 2023-06-23 ENCOUNTER — Ambulatory Visit (INDEPENDENT_AMBULATORY_CARE_PROVIDER_SITE_OTHER): Payer: Medicare HMO | Admitting: Family

## 2023-06-23 ENCOUNTER — Encounter (HOSPITAL_BASED_OUTPATIENT_CLINIC_OR_DEPARTMENT_OTHER): Payer: Self-pay | Admitting: Family

## 2023-06-23 VITALS — BP 142/86 | HR 79 | Ht 67.0 in | Wt 163.6 lb

## 2023-06-23 DIAGNOSIS — I5022 Chronic systolic (congestive) heart failure: Secondary | ICD-10-CM

## 2023-06-23 DIAGNOSIS — I255 Ischemic cardiomyopathy: Secondary | ICD-10-CM | POA: Diagnosis not present

## 2023-06-23 DIAGNOSIS — E785 Hyperlipidemia, unspecified: Secondary | ICD-10-CM

## 2023-06-23 DIAGNOSIS — R6889 Other general symptoms and signs: Secondary | ICD-10-CM | POA: Diagnosis not present

## 2023-06-23 DIAGNOSIS — I10 Essential (primary) hypertension: Secondary | ICD-10-CM | POA: Diagnosis not present

## 2023-06-23 DIAGNOSIS — I25118 Atherosclerotic heart disease of native coronary artery with other forms of angina pectoris: Secondary | ICD-10-CM | POA: Diagnosis not present

## 2023-06-23 MED ORDER — CARVEDILOL 25 MG PO TABS: 25.0000 mg | ORAL_TABLET | Freq: Two times a day (BID) | ORAL | 3 refills | Status: DC

## 2023-06-23 MED ORDER — ENTRESTO 49-51 MG PO TABS
1.0000 | ORAL_TABLET | Freq: Two times a day (BID) | ORAL | 3 refills | Status: DC
Start: 2023-06-23 — End: 2023-10-03

## 2023-06-23 MED ORDER — EZETIMIBE 10 MG PO TABS
10.0000 mg | ORAL_TABLET | Freq: Every day | ORAL | 3 refills | Status: AC
Start: 2023-06-23 — End: 2024-06-17

## 2023-06-23 NOTE — Patient Instructions (Addendum)
Medication Instructions:  Your physician has recommended you make the following change in your medication:  INCREASE COREG 25 MG TWICE A DAY   *If you need a refill on your cardiac medications before your next appointment, please call your pharmacy*   Follow-Up: At [facility], you and your health needs are our priority.  As part of our continuing mission to provide you with exceptional heart care, we have created designated Provider Care Teams.  These Care Teams include your primary Cardiologist (physician) and Advanced Practice Providers (APPs -  Physician Assistants and Nurse Practitioners) who all work together to provide you with the care you need, when you need it.  We recommend signing up for the patient portal called "MyChart".  Sign up information is provided on this After Visit Summary.  MyChart is used to connect with patients for Virtual Visits (Telemedicine).  Patients are able to view lab/test results, encounter notes, upcoming appointments, etc.  Non-urgent messages can be sent to your provider as well.   To learn more about what you can do with MyChart, go to [email or web address].    Your next appointment:   Follow up in 3-4 months with Dr. Duke Salvia or Gillian Shields, NP  Other Instructions  To prevent or reduce lower extremity swelling: Eat a low salt diet. Salt makes the body hold onto extra fluid which causes swelling. Sit with legs elevated. For example, in the recliner or on an ottoman.  Wear knee-high compression stockings during the daytime. Ones labeled 15-20 mmHg provide good compression.

## 2023-06-23 NOTE — Progress Notes (Signed)
Cardiology Office Note:  .   Date:  06/23/2023  ID:  Randy Sanchez, DOB 10-11-1955, MRN [id number] PCP: Sonny Masters, FNP  [facility] Providers Cardiologist:  Chilton Si, MD    History of Present Illness: .   Randy Sanchez is a 67 y.o. male with a hx of coronary disease s/p DES to Cx 07/2021, hypertension, ICM, HFrEF, hyperlipidemia, tobacco use, diabetes, OSA (declines CPAP).    He presented to the ED 07/25/2021 with chest pain with troponin peak >24,000.  Cardiac catheterization showed multivessel disease with culprit lesion being OM1 treated with PTCA.  Also 80% circumflex treated with DES.  Echo during admission showed LVEF 40-45%.  He had not been on any medications since 2018.  Jardiance was started for diabetes.  Entresto, carvedilol started for HFrEF and HTN.  Aspirin, statin, Imdur, beta-blocker started for coronary disease. Repeat echocardiogram 11/2021 LVEF 40-45%. At visit 12/20/21 with Dr. Duke Salvia BP not at goal, [facility] increased.    At telehealth visit 06/21/22 Imdur stopped due to ED. As no recurrent chest pain, he was given Rx for Sildenafil. Seen 12/23/2022 with BP not at goal and carvedilol was increased to 12.5 mg twice daily.   He was last seen 03/24/23. He was inadvertently taking Imdur which had previously been stopped for ED to allow for Sildenafil, it was stopped. Coreg increased to 25mg  BID for BP. Sildenafil adjusted to 100mg  PRN as reported previous dose for ED ineffective.   Presents today for follow-up. Weight down 2 lbs from last office visit. Walking for exercise. Eats predominantly at home, sister brings him meals as well. 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 Has been taking half tablet of Coreg BID for dose of 12.5mg . Notes swelling and burling in his left foot/ankle that comes and goes particularly with rain. On exam, nonpitting pretibial edema and varicose veins. Isolated episode of left sided chest pain at rest since last seen. He took an Aspirin  which resolved pain after a couple minutes. Reassurance provided atypical for angina as occurred at rest. BP at home has been labile. Last night BP was 190s while "helping a friend solve some problems with his old lady". He attributes the elevated BP to stress. However, notes not routinely at goal <130/80 by home readings.   Of note, did have a fall off a porch per his report after taking the 100mg  of Sildenafil when he felt lightheaded or dizzy. No recurrent lightheadedness or dizziness. Discussed he could trial half tablet and to take it easy after Sildenafil.  ROS: Please see the history of present illness.    All other systems reviewed and are negative.   Studies Reviewed: .        Cardiac Studies & Procedures   CARDIAC CATHETERIZATION  CARDIAC CATHETERIZATION 07/27/2021  Narrative   1st Mrg-1 lesion is 100% stenosed.  Releatively small vessel. Culprit for MI.  Balloon angioplasty was performed using a BALLN SAPPHIRE 2.0X15.   Post intervention, there is a 25% residual stenosis.   1st Mrg-2 lesion is 75% stenosed.  Balloon angioplasty was performed using a BALLN SAPPHIRE 2.0X15.   Post intervention, there is a 25% residual stenosis.   Ost LAD to Prox LAD lesion is 25% stenosed.   Mid Cx lesion is 80% stenosed.   A drug-eluting stent was successfully placed using a STENT ONYX FRONTIER 3.0X15, postdilated to 3.25 mm.   Post intervention, there is a 0% residual stenosis.   2nd Diag lesion is 80% stenosed.  Cardiology Office Note:  .   Date:  06/23/2023  ID:  Randy Sanchez, DOB 10-11-1955, MRN [id number] PCP: Sonny Masters, FNP  [facility] Providers Cardiologist:  Chilton Si, MD    History of Present Illness: .   Randy Sanchez is a 67 y.o. male with a hx of coronary disease s/p DES to Cx 07/2021, hypertension, ICM, HFrEF, hyperlipidemia, tobacco use, diabetes, OSA (declines CPAP).    He presented to the ED 07/25/2021 with chest pain with troponin peak >24,000.  Cardiac catheterization showed multivessel disease with culprit lesion being OM1 treated with PTCA.  Also 80% circumflex treated with DES.  Echo during admission showed LVEF 40-45%.  He had not been on any medications since 2018.  Jardiance was started for diabetes.  Entresto, carvedilol started for HFrEF and HTN.  Aspirin, statin, Imdur, beta-blocker started for coronary disease. Repeat echocardiogram 11/2021 LVEF 40-45%. At visit 12/20/21 with Dr. Duke Salvia BP not at goal, [facility] increased.    At telehealth visit 06/21/22 Imdur stopped due to ED. As no recurrent chest pain, he was given Rx for Sildenafil. Seen 12/23/2022 with BP not at goal and carvedilol was increased to 12.5 mg twice daily.   He was last seen 03/24/23. He was inadvertently taking Imdur which had previously been stopped for ED to allow for Sildenafil, it was stopped. Coreg increased to 25mg  BID for BP. Sildenafil adjusted to 100mg  PRN as reported previous dose for ED ineffective.   Presents today for follow-up. Weight down 2 lbs from last office visit. Walking for exercise. Eats predominantly at home, sister brings him meals as well. 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 Has been taking half tablet of Coreg BID for dose of 12.5mg . Notes swelling and burling in his left foot/ankle that comes and goes particularly with rain. On exam, nonpitting pretibial edema and varicose veins. Isolated episode of left sided chest pain at rest since last seen. He took an Aspirin  which resolved pain after a couple minutes. Reassurance provided atypical for angina as occurred at rest. BP at home has been labile. Last night BP was 190s while "helping a friend solve some problems with his old lady". He attributes the elevated BP to stress. However, notes not routinely at goal <130/80 by home readings.   Of note, did have a fall off a porch per his report after taking the 100mg  of Sildenafil when he felt lightheaded or dizzy. No recurrent lightheadedness or dizziness. Discussed he could trial half tablet and to take it easy after Sildenafil.  ROS: Please see the history of present illness.    All other systems reviewed and are negative.   Studies Reviewed: .        Cardiac Studies & Procedures   CARDIAC CATHETERIZATION  CARDIAC CATHETERIZATION 07/27/2021  Narrative   1st Mrg-1 lesion is 100% stenosed.  Releatively small vessel. Culprit for MI.  Balloon angioplasty was performed using a BALLN SAPPHIRE 2.0X15.   Post intervention, there is a 25% residual stenosis.   1st Mrg-2 lesion is 75% stenosed.  Balloon angioplasty was performed using a BALLN SAPPHIRE 2.0X15.   Post intervention, there is a 25% residual stenosis.   Ost LAD to Prox LAD lesion is 25% stenosed.   Mid Cx lesion is 80% stenosed.   A drug-eluting stent was successfully placed using a STENT ONYX FRONTIER 3.0X15, postdilated to 3.25 mm.   Post intervention, there is a 0% residual stenosis.   2nd Diag lesion is 80% stenosed.  RPDA-2 lesion is 50% stenosed.   RPDA-1 lesion is 50% stenosed.   Prox RCA lesion is 25% stenosed.   There is mild to moderate left ventricular systolic dysfunction.   LV end diastolic pressure is low.   The left ventricular ejection fraction is 35-45% by visual estimate.   There is no aortic valve stenosis.  I stressed the importance of dual antiplatelet therapy.  He will need aggressive secondary prevention including avoidance of any illicit substances, or tobacco.  He will  need aggressive blood pressure control.  High-dose statin.  Findings Coronary Findings Diagnostic  Dominance: Right  Left Anterior Descending Ost LAD to Prox LAD lesion is 25% stenosed.  Second Diagonal Branch 2nd Diag lesion is 80% stenosed.  Left Circumflex Mid Cx lesion is 80% stenosed.  First Obtuse Marginal Branch 1st Mrg-1 lesion is 100% stenosed. Vessel is the culprit lesion. The lesion is type C. 1st Mrg-2 lesion is 75% stenosed.  Right Coronary Artery Vessel is large. There is mild diffuse disease throughout the vessel. Prox RCA lesion is 25% stenosed.  Right Posterior Descending Artery RPDA-1 lesion is 50% stenosed. RPDA-2 lesion is 50% stenosed.  Intervention  Mid Cx lesion Stent CATH LAUNCHER 6FR EBU 3.5 SH guide catheter was inserted. Lesion crossed with guidewire using a WIRE ASAHI PROWATER 180CM. Pre-stent angioplasty was not performed. A drug-eluting stent was successfully placed using a STENT ONYX FRONTIER 3.0X15. Stent strut is well apposed. Post-stent angioplasty was performed using a BALLN SAPPHIRE [place] 3.25X10. Repeat PTCA of the OM was done after the stent was placed. Post-Intervention Lesion Assessment The intervention was successful. Pre-interventional TIMI flow is 3. Post-intervention TIMI flow is 3. No complications occurred at this lesion. There is a 0% residual stenosis post intervention.  1st Mrg-1 lesion Angioplasty CATH LAUNCHER 6FR EBU 3.5 SH guide catheter was inserted. WIRE HI TORQ BMW 190CM guidewire used to cross lesion. Balloon angioplasty was performed using a BALLN SAPPHIRE 2.0X15. Post-Intervention Lesion Assessment The intervention was successful. Pre-interventional TIMI flow is 0. Post-intervention TIMI flow is 3. No complications occurred at this lesion. There is a 25% residual stenosis post intervention.  1st Mrg-2 lesion Angioplasty CATH LAUNCHER 6FR EBU 3.5 SH guide catheter was inserted. WIRE HI TORQ BMW 190CM guidewire used to  cross lesion. Balloon angioplasty was performed using a BALLN SAPPHIRE 2.0X15. Post-Intervention Lesion Assessment The intervention was successful. Pre-interventional TIMI flow is 3. Post-intervention TIMI flow is 3. No complications occurred at this lesion. Multiple doses of IC nitroglycerin helped improve the appearance of the OM. There is a 25% residual stenosis post intervention.     ECHOCARDIOGRAM  ECHOCARDIOGRAM COMPLETE 11/15/2021  Narrative ECHOCARDIOGRAM REPORT    Patient Name:   Randy Sanchez Date of Exam: 11/15/2021 Medical Rec #:  [id number]      Height:       67.0 in Accession #:    [id number]     Weight:       148.0 lb Date of Birth:  [birth year]       BSA:          1.779 m Patient Age:    65 years       BP:           115/72 mmHg Patient Gender: M              HR:           79 bpm. Exam Location:  [facility]  Procedure: 2D Echo, Cardiac Doppler and Color Doppler  Cardiology Office Note:  .   Date:  06/23/2023  ID:  Randy Sanchez, DOB 10-11-1955, MRN [id number] PCP: Sonny Masters, FNP  [facility] Providers Cardiologist:  Chilton Si, MD    History of Present Illness: .   Randy Sanchez is a 67 y.o. male with a hx of coronary disease s/p DES to Cx 07/2021, hypertension, ICM, HFrEF, hyperlipidemia, tobacco use, diabetes, OSA (declines CPAP).    He presented to the ED 07/25/2021 with chest pain with troponin peak >24,000.  Cardiac catheterization showed multivessel disease with culprit lesion being OM1 treated with PTCA.  Also 80% circumflex treated with DES.  Echo during admission showed LVEF 40-45%.  He had not been on any medications since 2018.  Jardiance was started for diabetes.  Entresto, carvedilol started for HFrEF and HTN.  Aspirin, statin, Imdur, beta-blocker started for coronary disease. Repeat echocardiogram 11/2021 LVEF 40-45%. At visit 12/20/21 with Dr. Duke Salvia BP not at goal, [facility] increased.    At telehealth visit 06/21/22 Imdur stopped due to ED. As no recurrent chest pain, he was given Rx for Sildenafil. Seen 12/23/2022 with BP not at goal and carvedilol was increased to 12.5 mg twice daily.   He was last seen 03/24/23. He was inadvertently taking Imdur which had previously been stopped for ED to allow for Sildenafil, it was stopped. Coreg increased to 25mg  BID for BP. Sildenafil adjusted to 100mg  PRN as reported previous dose for ED ineffective.   Presents today for follow-up. Weight down 2 lbs from last office visit. Walking for exercise. Eats predominantly at home, sister brings him meals as well. 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 Has been taking half tablet of Coreg BID for dose of 12.5mg . Notes swelling and burling in his left foot/ankle that comes and goes particularly with rain. On exam, nonpitting pretibial edema and varicose veins. Isolated episode of left sided chest pain at rest since last seen. He took an Aspirin  which resolved pain after a couple minutes. Reassurance provided atypical for angina as occurred at rest. BP at home has been labile. Last night BP was 190s while "helping a friend solve some problems with his old lady". He attributes the elevated BP to stress. However, notes not routinely at goal <130/80 by home readings.   Of note, did have a fall off a porch per his report after taking the 100mg  of Sildenafil when he felt lightheaded or dizzy. No recurrent lightheadedness or dizziness. Discussed he could trial half tablet and to take it easy after Sildenafil.  ROS: Please see the history of present illness.    All other systems reviewed and are negative.   Studies Reviewed: .        Cardiac Studies & Procedures   CARDIAC CATHETERIZATION  CARDIAC CATHETERIZATION 07/27/2021  Narrative   1st Mrg-1 lesion is 100% stenosed.  Releatively small vessel. Culprit for MI.  Balloon angioplasty was performed using a BALLN SAPPHIRE 2.0X15.   Post intervention, there is a 25% residual stenosis.   1st Mrg-2 lesion is 75% stenosed.  Balloon angioplasty was performed using a BALLN SAPPHIRE 2.0X15.   Post intervention, there is a 25% residual stenosis.   Ost LAD to Prox LAD lesion is 25% stenosed.   Mid Cx lesion is 80% stenosed.   A drug-eluting stent was successfully placed using a STENT ONYX FRONTIER 3.0X15, postdilated to 3.25 mm.   Post intervention, there is a 0% residual stenosis.   2nd Diag lesion is 80% stenosed.  RPDA-2 lesion is 50% stenosed.   RPDA-1 lesion is 50% stenosed.   Prox RCA lesion is 25% stenosed.   There is mild to moderate left ventricular systolic dysfunction.   LV end diastolic pressure is low.   The left ventricular ejection fraction is 35-45% by visual estimate.   There is no aortic valve stenosis.  I stressed the importance of dual antiplatelet therapy.  He will need aggressive secondary prevention including avoidance of any illicit substances, or tobacco.  He will  need aggressive blood pressure control.  High-dose statin.  Findings Coronary Findings Diagnostic  Dominance: Right  Left Anterior Descending Ost LAD to Prox LAD lesion is 25% stenosed.  Second Diagonal Branch 2nd Diag lesion is 80% stenosed.  Left Circumflex Mid Cx lesion is 80% stenosed.  First Obtuse Marginal Branch 1st Mrg-1 lesion is 100% stenosed. Vessel is the culprit lesion. The lesion is type C. 1st Mrg-2 lesion is 75% stenosed.  Right Coronary Artery Vessel is large. There is mild diffuse disease throughout the vessel. Prox RCA lesion is 25% stenosed.  Right Posterior Descending Artery RPDA-1 lesion is 50% stenosed. RPDA-2 lesion is 50% stenosed.  Intervention  Mid Cx lesion Stent CATH LAUNCHER 6FR EBU 3.5 SH guide catheter was inserted. Lesion crossed with guidewire using a WIRE ASAHI PROWATER 180CM. Pre-stent angioplasty was not performed. A drug-eluting stent was successfully placed using a STENT ONYX FRONTIER 3.0X15. Stent strut is well apposed. Post-stent angioplasty was performed using a BALLN SAPPHIRE [place] 3.25X10. Repeat PTCA of the OM was done after the stent was placed. Post-Intervention Lesion Assessment The intervention was successful. Pre-interventional TIMI flow is 3. Post-intervention TIMI flow is 3. No complications occurred at this lesion. There is a 0% residual stenosis post intervention.  1st Mrg-1 lesion Angioplasty CATH LAUNCHER 6FR EBU 3.5 SH guide catheter was inserted. WIRE HI TORQ BMW 190CM guidewire used to cross lesion. Balloon angioplasty was performed using a BALLN SAPPHIRE 2.0X15. Post-Intervention Lesion Assessment The intervention was successful. Pre-interventional TIMI flow is 0. Post-intervention TIMI flow is 3. No complications occurred at this lesion. There is a 25% residual stenosis post intervention.  1st Mrg-2 lesion Angioplasty CATH LAUNCHER 6FR EBU 3.5 SH guide catheter was inserted. WIRE HI TORQ BMW 190CM guidewire used to  cross lesion. Balloon angioplasty was performed using a BALLN SAPPHIRE 2.0X15. Post-Intervention Lesion Assessment The intervention was successful. Pre-interventional TIMI flow is 3. Post-intervention TIMI flow is 3. No complications occurred at this lesion. Multiple doses of IC nitroglycerin helped improve the appearance of the OM. There is a 25% residual stenosis post intervention.     ECHOCARDIOGRAM  ECHOCARDIOGRAM COMPLETE 11/15/2021  Narrative ECHOCARDIOGRAM REPORT    Patient Name:   Randy Sanchez Date of Exam: 11/15/2021 Medical Rec #:  [id number]      Height:       67.0 in Accession #:    [id number]     Weight:       148.0 lb Date of Birth:  [birth year]       BSA:          1.779 m Patient Age:    65 years       BP:           115/72 mmHg Patient Gender: M              HR:           79 bpm. Exam Location:  [facility]  Procedure: 2D Echo, Cardiac Doppler and Color Doppler

## 2023-06-24 ENCOUNTER — Other Ambulatory Visit (HOSPITAL_BASED_OUTPATIENT_CLINIC_OR_DEPARTMENT_OTHER): Payer: Self-pay | Admitting: Family

## 2023-06-27 ENCOUNTER — Ambulatory Visit: Payer: Medicare HMO | Admitting: Family Medicine

## 2023-06-27 ENCOUNTER — Encounter: Payer: Self-pay | Admitting: Family Medicine

## 2023-07-03 DIAGNOSIS — R6889 Other general symptoms and signs: Secondary | ICD-10-CM | POA: Diagnosis not present

## 2023-07-04 ENCOUNTER — Encounter: Payer: Self-pay | Admitting: Family Medicine

## 2023-07-04 ENCOUNTER — Other Ambulatory Visit: Payer: Self-pay

## 2023-07-04 ENCOUNTER — Ambulatory Visit: Payer: Medicare HMO | Admitting: Family Medicine

## 2023-07-04 ENCOUNTER — Ambulatory Visit (INDEPENDENT_AMBULATORY_CARE_PROVIDER_SITE_OTHER): Payer: Medicare HMO

## 2023-07-04 VITALS — BP 143/91 | HR 92 | Temp 97.5°F | Ht 67.0 in | Wt 166.6 lb

## 2023-07-04 DIAGNOSIS — N522 Drug-induced erectile dysfunction: Secondary | ICD-10-CM

## 2023-07-04 DIAGNOSIS — E1122 Type 2 diabetes mellitus with diabetic chronic kidney disease: Secondary | ICD-10-CM

## 2023-07-04 DIAGNOSIS — M7989 Other specified soft tissue disorders: Secondary | ICD-10-CM | POA: Diagnosis not present

## 2023-07-04 DIAGNOSIS — Z23 Encounter for immunization: Secondary | ICD-10-CM

## 2023-07-04 DIAGNOSIS — E1159 Type 2 diabetes mellitus with other circulatory complications: Secondary | ICD-10-CM

## 2023-07-04 DIAGNOSIS — M79672 Pain in left foot: Secondary | ICD-10-CM

## 2023-07-04 DIAGNOSIS — I152 Hypertension secondary to endocrine disorders: Secondary | ICD-10-CM

## 2023-07-04 DIAGNOSIS — I25118 Atherosclerotic heart disease of native coronary artery with other forms of angina pectoris: Secondary | ICD-10-CM

## 2023-07-04 DIAGNOSIS — E1169 Type 2 diabetes mellitus with other specified complication: Secondary | ICD-10-CM | POA: Diagnosis not present

## 2023-07-04 DIAGNOSIS — M19072 Primary osteoarthritis, left ankle and foot: Secondary | ICD-10-CM | POA: Diagnosis not present

## 2023-07-04 DIAGNOSIS — N183 Chronic kidney disease, stage 3 unspecified: Secondary | ICD-10-CM

## 2023-07-04 DIAGNOSIS — R6889 Other general symptoms and signs: Secondary | ICD-10-CM | POA: Diagnosis not present

## 2023-07-04 DIAGNOSIS — I5022 Chronic systolic (congestive) heart failure: Secondary | ICD-10-CM | POA: Diagnosis not present

## 2023-07-04 DIAGNOSIS — F40231 Fear of injections and transfusions: Secondary | ICD-10-CM | POA: Diagnosis not present

## 2023-07-04 DIAGNOSIS — E559 Vitamin D deficiency, unspecified: Secondary | ICD-10-CM

## 2023-07-04 LAB — BAYER DCA HB A1C WAIVED: HB A1C (BAYER DCA - WAIVED): 7.4 % — ABNORMAL HIGH (ref 4.8–5.6)

## 2023-07-04 MED ORDER — SILDENAFIL CITRATE 100 MG PO TABS: 100.0000 mg | ORAL_TABLET | Freq: Every day | ORAL | 0 refills | Status: DC | PRN

## 2023-07-04 MED ORDER — GLIPIZIDE 5 MG PO TABS: 5.0000 mg | ORAL_TABLET | Freq: Two times a day (BID) | ORAL | 3 refills | Status: DC

## 2023-07-04 MED ORDER — RYBELSUS 7 MG PO TABS: 7.0000 mg | ORAL_TABLET | Freq: Every day | ORAL | 6 refills | Status: DC

## 2023-07-04 NOTE — Progress Notes (Signed)
Subjective:  Patient ID: Randy Sanchez, male    DOB: October 12, 1955, 67 y.o.   MRN: [id number]  Patient Care Team: Sonny Masters, FNP as PCP - General (Family Medicine) Chilton Si, MD as PCP - Cardiology (Cardiology)   Chief Complaint:  Diabetes (3 month follow up )   HPI: Randy Sanchez is a 67 y.o. male presenting on 07/04/2023 for Diabetes (3 month follow up )   Discussed the use of AI scribe software for clinical note transcription with the patient, who gave verbal consent to proceed.  History of Present Illness   The patient, with a history of cardiomyopathy, diabetes, and cardiovascular disease, presents today for management of chronic medical conditions. He has a complaint of foot pain that started approximately three weeks ago. The pain is localized to the top of the left foot and has been severe enough to limit mobility. The patient denies any known injury to the foot. The pain was associated with swelling, which was noted during a recent cardiology appointment.  The patient also reports issues with transportation to appointments, resulting in missed visits. He has been experiencing difficulties in reaching the office by phone.  Regarding his diabetes, the patient checks his blood sugar levels at home daily and reports that the readings have been good. However, he has been experiencing increased thirst.  The patient also reports erectile dysfunction, which he attributes to his current medications. He has tried Viagra in the past with limited success.  The patient's current medications include Entresto and Coreg for his heart condition, and Marcelline Deist for his diabetes. The patient is running low on Farxiga and will need a refill soon.  The patient's foot pain and erectile dysfunction are his primary concerns at this time.          Relevant past medical, surgical, family, and social history reviewed and updated as indicated.  Allergies and medications reviewed and  updated. Data reviewed: Chart in Epic.   Past Medical History:  Diagnosis Date   Arthritis    Chronic headaches    Diabetes mellitus type II, non insulin dependent (HCC) 07/28/2021   HTN, goal below 130/80 07/28/2021   Hyperlipidemia associated with type 2 diabetes mellitus (HCC), goal LDL < 70 07/28/2021   Ischemic cardiomyopathy 07/28/2021   NSTEMI (non-ST elevated myocardial infarction) (HCC) 07/27/2021   Tobacco abuse 12/20/2021    Past Surgical History:  Procedure Laterality Date   CORONARY STENT INTERVENTION N/A 07/27/2021   Procedure: CORONARY STENT INTERVENTION;  Surgeon: Corky Crafts, MD;  Location: [facility] INVASIVE CV [facility];  Service: Cardiovascular;  Laterality: N/A;   LEFT HEART CATH AND CORONARY ANGIOGRAPHY N/A 07/27/2021   Procedure: LEFT HEART CATH AND CORONARY ANGIOGRAPHY;  Surgeon: Corky Crafts, MD;  Location: [facility]VASIVE CV LAB;  Service: Cardiovascular;  Laterality: N/A;    Social History   Socioeconomic History   Marital status: Widowed    Spouse name: Not on file   Number of children: 0   Years of education: Not on file   Highest education level: Not on file  Occupational History   Occupation: retired  Tobacco Use   Smoking status: Former    Current packs/day: 0.00    Types: Cigarettes    Quit date: 01/31/2022    Years since quitting: 1.4   Smokeless tobacco: Never  Vaping Use   Vaping status: Never Used  Substance and Sexual Activity   Alcohol use: Yes    Alcohol/week: 2.0 standard drinks of alcohol  Types: 2 Shots of liquor per week    Comment: 10-15 shot of Ree Kida daniel every other day   Drug use: Not Currently    Types: Cocaine, Marijuana    Comment: last week   Sexual activity: Not Currently  Other Topics Concern   Not on file  Social History Narrative   Not on file   Social Determinants of Health   Financial Resource Strain: Not on file  Food Insecurity: Not on file  Transportation Needs: Not on file  Physical Activity:  Not on file  Stress: Not on file  Social Connections: Not on file  Intimate Partner Violence: Not on file    Outpatient Encounter Medications as of 07/04/2023  Medication Sig   Accu-Chek Softclix Lancets lancets Test BS daily and 3x as needed Dx E11.9   aspirin EC 81 MG tablet Take 81 mg by mouth daily. Swallow whole.   atorvastatin (LIPITOR) 80 MG tablet TAKE ONE (1) TABLET BY MOUTH EVERY DAY   Blood Glucose Monitoring Suppl (ACCU-CHEK GUIDE ME) w/Device KIT Test BS daily and as needed Dx E11.9   Blood Glucose Monitoring Suppl DEVI 1 each by Does not apply route in the morning, at noon, and at bedtime. May substitute to any manufacturer covered by patient's insurance.   Blood Pressure KIT CHECK YOUR BLOOD PRESSURE TWICE A DAY   carvedilol (COREG) 25 MG tablet Take 1 tablet (25 mg total) by mouth 2 (two) times daily with a meal.   dapagliflozin propanediol (FARXIGA) 10 MG TABS tablet Take 1 tablet (10 mg total) by mouth daily before breakfast.   ezetimibe (ZETIA) 10 MG tablet Take 1 tablet (10 mg total) by mouth daily.   glucose blood (ACCU-CHEK GUIDE) test strip Test BS daily and 3x as needed Dx E11.9   nitroGLYCERIN (NITROSTAT) 0.4 MG SL tablet Place 1 tablet (0.4 mg total) under the tongue every 5 (five) minutes x 3 doses as needed for chest pain.   sacubitril-valsartan (ENTRESTO) 49-51 MG Take 1 tablet by mouth 2 (two) times daily.   [DISCONTINUED] glipiZIDE (GLUCOTROL) 5 MG tablet Take 1 tablet (5 mg total) by mouth 2 (two) times daily before a meal.   [DISCONTINUED] Semaglutide (RYBELSUS) 7 MG TABS Take 1 tablet (7 mg total) by mouth daily. DX: E11.65   glipiZIDE (GLUCOTROL) 5 MG tablet Take 1 tablet (5 mg total) by mouth 2 (two) times daily before a meal.   Semaglutide (RYBELSUS) 7 MG TABS Take 1 tablet (7 mg total) by mouth daily. DX: E11.65   sildenafil (VIAGRA) 100 MG tablet Take 1 tablet (100 mg total) by mouth daily as needed for erectile dysfunction.   [DISCONTINUED] diclofenac  Sodium (VOLTAREN) 1 % GEL APPLY 2 GRAMS 4 TIMES DAILY   [DISCONTINUED] sildenafil (VIAGRA) 100 MG tablet Take 1 tablet (100 mg total) by mouth daily as needed for erectile dysfunction.   No facility-administered encounter medications on file as of 07/04/2023.    No Known Allergies  Pertinent ROS per HPI, otherwise unremarkable      Objective:  BP (!) 143/91   Pulse 92   Temp (!) 97.5 F (36.4 C) (Temporal)   Ht 5\' 7"  (1.702 m)   Wt 166 lb 9.6 oz (75.6 kg)   SpO2 95%   BMI 26.09 kg/m    Wt Readings from Last 3 Encounters:  07/04/23 166 lb 9.6 oz (75.6 kg)  06/23/23 163 lb 9.6 oz (74.2 kg)  03/24/23 166 lb 14.4 oz (75.7 kg)    Physical  Exam Vitals and nursing note reviewed.  Constitutional:      General: He is not in acute distress.    Appearance: Normal appearance. He is not ill-appearing, toxic-appearing or diaphoretic.  HENT:     Head: Normocephalic and atraumatic.     Mouth/Throat:     Mouth: Mucous membranes are moist.     Pharynx: Oropharynx is clear.  Eyes:     Conjunctiva/sclera: Conjunctivae normal.     Pupils: Pupils are equal, round, and reactive to light.  Cardiovascular:     Rate and Rhythm: Normal rate and regular rhythm.     Pulses:          Dorsalis pedis pulses are 2+ on the left side.       Posterior tibial pulses are 2+ on the left side.     Heart sounds: Normal heart sounds.  Pulmonary:     Effort: Pulmonary effort is normal.     Breath sounds: Normal breath sounds.  Musculoskeletal:     Cervical back: Neck supple.     Right lower leg: No edema.     Left lower leg: No edema.       Feet:  Skin:    General: Skin is warm and dry.     Capillary Refill: Capillary refill takes less than 2 seconds.  Neurological:     General: No focal deficit present.     Mental Status: He is alert and oriented to person, place, and time.  Psychiatric:        Mood and Affect: Mood normal.        Behavior: Behavior normal.        Thought Content: Thought  content normal.        Judgment: Judgment normal.    Physical Exam   EXTREMITIES: Good pulses in the foot. Tenderness on palpation of the top of the foot. Arthritis changes on foot X-ray.        Results for orders placed or performed in visit on 03/20/23  HM DIABETES EYE EXAM  Result Value Ref Range   HM Diabetic Eye Exam No Retinopathy No Retinopathy       Pertinent labs & imaging results that were available during my care of the patient were reviewed by me and considered in my medical decision making.  Assessment & Plan:  Shmuel was seen today for diabetes.  Diagnoses and all orders for this visit:  Type 2 diabetes mellitus with other specified complication, without long-term current use of insulin (HCC) -     CMP14+EGFR -     Bayer DCA Hb A1c Waived -     glipiZIDE (GLUCOTROL) 5 MG tablet; Take 1 tablet (5 mg total) by mouth 2 (two) times daily before a meal. -     Semaglutide (RYBELSUS) 7 MG TABS; Take 1 tablet (7 mg total) by mouth daily. DX: E11.65 -     Pneumococcal conjugate vaccine 20-valent (Prevnar 20)  Vitamin D deficiency -     CMP14+EGFR -     VITAMIN D 25 Hydroxy (Vit-D Deficiency, Fractures)  Chronic systolic heart failure (HCC) -     CMP14+EGFR -     Semaglutide (RYBELSUS) 7 MG TABS; Take 1 tablet (7 mg total) by mouth daily. DX: E11.65  Severe needle phobia -     Semaglutide (RYBELSUS) 7 MG TABS; Take 1 tablet (7 mg total) by mouth daily. DX: E11.65  Coronary artery disease of native artery of native heart with stable angina pectoris (HCC) -  CMP14+EGFR -     Semaglutide (RYBELSUS) 7 MG TABS; Take 1 tablet (7 mg total) by mouth daily. DX: E11.65  Hyperlipidemia associated with type 2 diabetes mellitus (HCC), goal LDL < 70 -     CMP14+EGFR  Hypertension associated with type 2 diabetes mellitus (HCC) -     CMP14+EGFR  CKD stage 3 due to type 2 diabetes mellitus (HCC) -     CMP14+EGFR  Left foot pain -     DG Foot Complete Left  Drug-induced  erectile dysfunction -     sildenafil (VIAGRA) 100 MG tablet; Take 1 tablet (100 mg total) by mouth daily as needed for erectile dysfunction.     Assessment and Plan    Foot Pain New onset foot pain for the past 3 weeks, no known injury. X-ray showed no fractures, but possible arthritis changes. -Apply Voltaren gel twice daily. -Consider further imaging or referral to orthopedics if pain worsens.  Cardiomyopathy On Entresto and Coreg, with consideration for addition of spironolactone if current regimen is not effective. Next echocardiogram due soon to assess ejection fraction. -Continue current medications. -Consider addition of spironolactone if current regimen is not effective.  Diabetes Mellitus Blood glucose levels reportedly well-controlled at home, but recent HbA1c was 7.4. -Encouraged to maintain good glycemic control, with a goal HbA1c below 7.  Erectile Dysfunction Likely secondary to cardiovascular disease, diabetes, and current medications (Entresto and Coreg). Viagra reportedly not very effective. -Resend prescription for Viagra. -Consider referral to urology if Viagra remains ineffective.  General Health Maintenance -Administered pneumonia vaccine today. -Plan to complete comprehensive labs at the beginning of the year.          Continue all other maintenance medications.  Follow up plan: Return in about 3 months (around 10/04/2023) for chroni follow up, all labs.   Continue healthy lifestyle choices, including diet (rich in fruits, vegetables, and lean proteins, and low in salt and simple carbohydrates) and exercise (at least 30 minutes of moderate physical activity daily).  Educational handout given for DM  The above assessment and management plan was discussed with the patient. The patient verbalized understanding of and has agreed to the management plan. Patient is aware to call the clinic if they develop any new symptoms or if symptoms persist or worsen.  Patient is aware when to return to the clinic for a follow-up visit. Patient educated on when it is appropriate to go to the emergency department.   Kari Baars, FNP-C [facility] [phone]

## 2023-07-04 NOTE — Patient Instructions (Addendum)

## 2023-07-05 LAB — CMP14+EGFR
ALT: 21 [IU]/L (ref 0–44)
AST: 17 [IU]/L (ref 0–40)
Albumin: 4 g/dL (ref 3.9–4.9)
Alkaline Phosphatase: 78 IU/L (ref 44–121)
BUN/Creatinine Ratio: 9 — ABNORMAL LOW (ref 10–24)
BUN: 11 mg/dL (ref 8–27)
Bilirubin Total: 0.4 mg/dL (ref 0.0–1.2)
CO2: 21 mmol/L (ref 20–29)
Calcium: 9.3 mg/dL (ref 8.6–10.2)
Chloride: 106 mmol/L (ref 96–106)
Creatinine, Ser: 1.29 mg/dL — ABNORMAL HIGH (ref 0.76–1.27)
Globulin, Total: 2.9 g/dL (ref 1.5–4.5)
Glucose: 177 mg/dL — ABNORMAL HIGH (ref 70–99)
Potassium: 4.7 mmol/L (ref 3.5–5.2)
Sodium: 141 mmol/L (ref 134–144)
Total Protein: 6.9 g/dL (ref 6.0–8.5)
eGFR: 62 mL/min/{1.73_m2} (ref >59)

## 2023-07-05 LAB — VITAMIN D 25 HYDROXY (VIT D DEFICIENCY, FRACTURES): Vit D, 25-Hydroxy: 19 ng/mL — ABNORMAL LOW (ref 30.0–100.0)

## 2023-07-13 DIAGNOSIS — E1122 Type 2 diabetes mellitus with diabetic chronic kidney disease: Secondary | ICD-10-CM | POA: Diagnosis not present

## 2023-07-13 DIAGNOSIS — N182 Chronic kidney disease, stage 2 (mild): Secondary | ICD-10-CM | POA: Diagnosis not present

## 2023-07-13 DIAGNOSIS — R6889 Other general symptoms and signs: Secondary | ICD-10-CM | POA: Diagnosis not present

## 2023-07-13 DIAGNOSIS — I129 Hypertensive chronic kidney disease with stage 1 through stage 4 chronic kidney disease, or unspecified chronic kidney disease: Secondary | ICD-10-CM | POA: Diagnosis not present

## 2023-07-13 DIAGNOSIS — E1129 Type 2 diabetes mellitus with other diabetic kidney complication: Secondary | ICD-10-CM | POA: Diagnosis not present

## 2023-09-26 ENCOUNTER — Ambulatory Visit: Payer: Self-pay | Admitting: Family Medicine

## 2023-09-26 NOTE — Telephone Encounter (Signed)
  Chief Complaint: R arm swelling Symptoms: R arm swelling, pain, warmth Frequency: 4 days Pertinent Negatives: Patient denies SOB, drainage Disposition: [x] ED /[] Urgent Care (no appt availability in office) / [] Appointment(In office/virtual)/ []  [facility]/ [] Home Care/ [x] Refused Recommended Disposition /[] [facility]/ []  Follow-up with PCP Additional Notes: Patient called reporting R arm swelling from elbow to shoulder x4 days. States it is hot to touch, no redness or drainage noted. States skin is very tight and 9/10 pain with touch. Patient denies injury, fever. Per protocol, patient to be evaluated in ED now. Patient refused and during conversation regarding recommendations, patient stated "I will just soak it in warm water, bye" and disconnected call with this RN. Contacted [facility], spoke with Leeroy Bock at desk who states refusal will be relayed to RN when available. Unable to review care advice. Denies further questions at this time. Alerting PCP for review.    Copied from CRM [id number]. Topic: Clinical - Red Word Triage >> Sep 26, 2023  9:46 AM Rona Ravens wrote: [facility] that prompted transfer to Nurse Triage: Sarajane Marek (sister) called in stating that the patient cannot get through to speak to anyone. Reached out to patient directly and he advised that his right arm is swelling from his elbow towards his shoulder and is very painful and has been happening since Saturday 09/23/2023. Reason for Disposition  SEVERE arm swelling (e.g., all of arm looks swollen)  Answer Assessment - Initial Assessment Questions 1. ONSET: "When did the swelling start?" (e.g., minutes, hours, days)     4 days 2. LOCATION: "What part of the arm is swollen?"  "Are both arms swollen or just one arm?"     R arm around elbow up to shoulder 3. SEVERITY: "How bad is the swelling?" (e.g., localized; mild, moderate, severe)   - LOCALIZED: Small area of puffiness or swelling on just one arm   -  JOINT SWELLING: Swelling of one joint   - MILD: Puffiness or swelling of hand   - MODERATE: Puffiness or swollen feeling of entire arm    - SEVERE: All of arm looks swollen; pitting edema     Severe 4. REDNESS: "Does the swelling look red or infected?"     Denies 5. PAIN: "Is the swelling painful to touch?" If Yes, ask: "How painful is it?"   (Scale 1-10; mild, moderate or severe)     9/10 6. FEVER: "Do you have a fever?" If Yes, ask: "What is it, how was it measured, and when did it start?"      Denies 7. CAUSE: "What do you think is causing the arm swelling?"     Unsure 8. MEDICAL HISTORY: "Do you have a history of heart failure, kidney disease, liver failure, or cancer?"     Heart attack 2024 9. RECURRENT SYMPTOM: "Have you had arm swelling before?" If Yes, ask: "When was the last time?" "What happened that time?"     Yes, but not this bad- approx 6-8 months ago, unknown cause 10. OTHER SYMPTOMS: "Do you have any other symptoms?" (e.g., chest pain, difficulty breathing)       Denies at this time.  Protocols used: Arm Swelling and Edema-A-AH

## 2023-09-29 ENCOUNTER — Encounter (HOSPITAL_BASED_OUTPATIENT_CLINIC_OR_DEPARTMENT_OTHER): Payer: Self-pay

## 2023-10-03 ENCOUNTER — Encounter (HOSPITAL_BASED_OUTPATIENT_CLINIC_OR_DEPARTMENT_OTHER): Payer: Self-pay | Admitting: Family

## 2023-10-03 ENCOUNTER — Ambulatory Visit (INDEPENDENT_AMBULATORY_CARE_PROVIDER_SITE_OTHER): Payer: Medicare HMO | Admitting: Family

## 2023-10-03 VITALS — BP 144/92 | HR 76 | Ht 64.0 in | Wt 170.0 lb

## 2023-10-03 DIAGNOSIS — I10 Essential (primary) hypertension: Secondary | ICD-10-CM

## 2023-10-03 DIAGNOSIS — I25118 Atherosclerotic heart disease of native coronary artery with other forms of angina pectoris: Secondary | ICD-10-CM

## 2023-10-03 DIAGNOSIS — Z5181 Encounter for therapeutic drug level monitoring: Secondary | ICD-10-CM

## 2023-10-03 DIAGNOSIS — I255 Ischemic cardiomyopathy: Secondary | ICD-10-CM

## 2023-10-03 DIAGNOSIS — E785 Hyperlipidemia, unspecified: Secondary | ICD-10-CM | POA: Diagnosis not present

## 2023-10-03 MED ORDER — SACUBITRIL-VALSARTAN 97-103 MG PO TABS: 1.0000 | ORAL_TABLET | Freq: Two times a day (BID) | ORAL | 5 refills | Status: AC

## 2023-10-03 NOTE — Patient Instructions (Addendum)
Medication Instructions:  INCREASE YOUR ENTRESTO TO 97-106 MG TWICE A DAY. OK TO DOUBLE UP ON WHAT YOU HAVE UNTIL YOU RUN OUT  Labwork: BMET IN 1 TO 2 WEEKS   Testing/Procedures: NONE  Follow-Up: 3 MONTHS WITH EITHER [facility] OR DR [place]wood   Any Other Special Instructions Will Be Listed Below (If Applicable).  Recommend keeping your right arm elevated as much as possible May take Tylenol 2 tablets three times day (every 6-8 hours) Recommend ice on the elbow for 20 minutes at a time  RICE Therapy for Routine Care of Injuries Many injuries can be cared for with rest, ice, compression, and elevation. This is also called RICE therapy. RICE therapy includes: Resting the injured body part. Putting ice on the injury. Putting pressure on the injury. This is also called compression. Raising the injured part. This is also called elevation. RICE therapy can help reduce pain and swelling. Supplies needed: Ice. Plastic bag. Towel. Elastic bandage. Pillow or pillows to raise the injured body part. How to care for your injury with RICE therapy Rest Try to rest the injured part of your body. You can go back to your normal activities when your health care provider says it's okay to do them and when you can do them without pain. Ask what things are safe for you to do. Some injuries heal better with early movement instead of resting. If you rest the injury too much, it may not heal as well. Ask your provider if you should do exercises to help your injury get better. Ice Putting ice on your injury can help to lessen swelling and pain. Do not apply ice directly to your skin. Use ice on as many days as told by your provider. If told, put ice on the area. Put ice in a plastic bag. Place a towel between your skin and the bag. Leave the ice on for 20 minutes, 2-3 times a day. If your skin turns bright red, take off the ice right away to prevent skin damage. The risk of damage is higher if you  can't feel pain, heat, or cold.  Compression Put pressure, also called compression, on your injured area. This can be done with an elastic bandage. If this type of bandage has been put on your injury: Follow instructions on the package the bandage came in about how to use it. Do not wrap the bandage too tightly. Wrap the bandage more loosely if part of your body beyond the bandage looks blue, or is swollen, cold, painful, or loses feeling. Take off the bandage and put it on again every 3-4 hours or as told by your provider. Call your provider if the bandage seems to make your injury worse.  Elevation Raise the injured area above the level of your heart while you're sitting or lying down. Use a pillow to support your injured area as needed. Follow these instructions at home: If your symptoms get worse or last a long time, make a follow-up appointment with your provider. You may need to have imaging tests, such as X-rays or an MRI. If you have imaging tests, ask how to get your results when they are ready. Contact a health care provider if: You keep having pain and swelling. Your symptoms get worse. Get help right away if: You have sudden, very bad pain at your injury or lower than your injury. You have tingling or numbness at your injury or lower than your injury, and it does not go away when you  take the bandage off. This information is not intended to replace advice given to you by your health care provider. Make sure you discuss any questions you have with your health care provider. Document Revised: 05/25/2023 Document Reviewed: 11/07/2022 Elsevier Patient Education  2024 [employer].

## 2023-10-03 NOTE — Progress Notes (Signed)
Cardiology Office Note:  .   Date:  10/03/2023  ID:  [name], DOB 09-18-1955, MRN [id number] PCP: Sonny Masters, FNP  [facility] Providers Cardiologist:  Chilton Si, MD    History of Present Illness: .   Randy Sanchez is a 68 y.o. male with a hx of coronary disease s/p DES to Cx 07/2021, hypertension, ICM, HFrEF, hyperlipidemia, tobacco use, diabetes, OSA (declines CPAP).    He presented to the ED 07/25/2021 with chest pain with troponin peak >24,000.  Cardiac catheterization showed multivessel disease with culprit lesion being OM1 treated with PTCA.  Also 80% circumflex treated with DES.  Echo during admission showed LVEF 40-45%.  He had not been on any medications since 2018.  Jardiance was started for diabetes.  Entresto, carvedilol started for HFrEF and HTN.  Aspirin, statin, Imdur, beta-blocker started for coronary disease. Repeat echocardiogram 11/2021 LVEF 40-45%. At visit 12/20/21 with Dr. Duke Salvia BP not at goal, [facility] increased.    At telehealth visit 06/21/22 Imdur stopped due to ED. As no recurrent chest pain, he was given Rx for Sildenafil. Seen 12/23/2022 with BP not at goal and carvedilol was increased to 12.5 mg twice daily. At visit 03/24/23. He was inadvertently taking Imdur which had previously been stopped for ED to allow for Sildenafil, it was stopped. Coreg increased to 25mg  BID for BP. Last seen 06/23/23 with BP not at goal, was taking Coreg 12.5mg  instead of 25 mg dose and it was increased.   Presents today for follow-up.  He notes R upper arm swelling for 10 days starting at elbow and radiating up to R shoulder. No known injury nor fall. He kept it wrapped up with some improvement. Reports pain describes as throbbing and burning which she has taken as needed Tylenol with some improvement. Isolated episode of chest pain which self-resolved  not associated with right arm swelling. No dyspnea, lightheadedness, dizziness, near syncope, syncope, palpitations,  LE edema, orthopnea, PND.  Continues to walk for exercise.  ROS: Please see the history of present illness.    All other systems reviewed and are negative.   Studies Reviewed: .           Risk Assessment/Calculations:     HYPERTENSION CONTROL Vitals:   10/03/23 0904 10/03/23 0925  BP: (!) 140/90 (!) 144/92    The patient's blood pressure is elevated above target today.  In order to address the patient's elevated BP: A current anti-hypertensive medication was adjusted today.          Physical Exam:   VS:  BP (!) 144/92   Pulse 76   Ht 5\' 4"  (1.626 m)   Wt 170 lb (77.1 kg)   SpO2 99%   BMI 29.18 kg/m    Wt Readings from Last 3 Encounters:  10/03/23 170 lb (77.1 kg)  07/04/23 166 lb 9.6 oz (75.6 kg)  06/23/23 163 lb 9.6 oz (74.2 kg)    Vitals:   10/03/23 0904 10/03/23 0925  BP: (!) 140/90 (!) 144/92  Pulse: 76   Height: 5\' 4"  (1.626 m)   Weight: 170 lb (77.1 kg)   SpO2: 99%   BMI (Calculated): 29.17     GEN: Well nourished, well developed in no acute distress NECK: No JVD; No carotid bruits CARDIAC: RRR, no murmurs, rubs, gallops RESPIRATORY:  Clear to auscultation without rales, wheezing or rhonchi  ABDOMEN: Soft, non-tender, non-distended EXTREMITIES: No edema. R elbow with localized swelling, tender on palpation  ASSESSMENT AND PLAN: .  R elbow swelling - Onset 10 days ago. No known fall nor injury. Swelling initially radiated up to shoulder but improving. Limited ROM with flexion. Concern for R elbow bursitis. Recommend ice, elevation, and PRN Tylenol. Defer NSAID due to CKD. Has follow up with PCP tomorrow for further eval.  CAD s/p DES Cx 07/2021 / HLD, LDL goal <70 / ED - Continue GDMT aspirin, Coreg, Atorvastatin, Zetia, Jardiance. Imdur previously stopped due to ED requiring Sildenafil. As no recurrent symptoms, will not resume.. Stable with no anginal symptoms. No indication for ischemic evaluation.  Recommend aiming for 150 minutes of moderate  intensity activity per week and following a heart healthy diet.       DM2 - Continue to follow with PCP.    HTN - BP not at goal with reading in clinic 140/90 ? 144/92. Increase Entresto to 97/103mg  BID. Will need BMP in 1-2 weeks. Will reach out to PCP to see if feasible at their office as closer for Mr. Siever who requires transport. Continue Coreg 25mg  BID. If additional agent needed in future, consider Spironolactone.    ICM / HFrEF - 11/2021 LVEF 40-45%. Euvolemic and well compensated on exam.  Continue GDMT Coreg, Jardiance, Entresto. Consider Spironolactone if needed in future for BP control.  Deferred today as he is euvolemic.. Careful avoidance of polypharmacy. Low sodium diet, fluid restriction <2L, and daily weights encouraged. Educated to contact our office for weight gain of 2 lbs overnight or 5 lbs in one week.    Tobacco use - Continue smoking cessation encouraged. Recommend utilization of [id number].          Dispo: follow up in 3-4 mos  Signed, Alver Sorrow, NP

## 2023-10-04 ENCOUNTER — Encounter: Payer: Self-pay | Admitting: Family Medicine

## 2023-10-04 ENCOUNTER — Ambulatory Visit: Payer: Medicare HMO | Admitting: Family Medicine

## 2023-10-04 VITALS — BP 146/82 | HR 60 | Temp 97.4°F | Ht 64.0 in | Wt 168.0 lb

## 2023-10-04 DIAGNOSIS — F40231 Fear of injections and transfusions: Secondary | ICD-10-CM

## 2023-10-04 DIAGNOSIS — I5022 Chronic systolic (congestive) heart failure: Secondary | ICD-10-CM | POA: Diagnosis not present

## 2023-10-04 DIAGNOSIS — Z7984 Long term (current) use of oral hypoglycemic drugs: Secondary | ICD-10-CM

## 2023-10-04 DIAGNOSIS — E785 Hyperlipidemia, unspecified: Secondary | ICD-10-CM | POA: Diagnosis not present

## 2023-10-04 DIAGNOSIS — E1159 Type 2 diabetes mellitus with other circulatory complications: Secondary | ICD-10-CM | POA: Diagnosis not present

## 2023-10-04 DIAGNOSIS — N183 Chronic kidney disease, stage 3 unspecified: Secondary | ICD-10-CM

## 2023-10-04 DIAGNOSIS — E559 Vitamin D deficiency, unspecified: Secondary | ICD-10-CM

## 2023-10-04 DIAGNOSIS — E1122 Type 2 diabetes mellitus with diabetic chronic kidney disease: Secondary | ICD-10-CM

## 2023-10-04 DIAGNOSIS — I25118 Atherosclerotic heart disease of native coronary artery with other forms of angina pectoris: Secondary | ICD-10-CM | POA: Diagnosis not present

## 2023-10-04 DIAGNOSIS — E1169 Type 2 diabetes mellitus with other specified complication: Secondary | ICD-10-CM

## 2023-10-04 DIAGNOSIS — I152 Hypertension secondary to endocrine disorders: Secondary | ICD-10-CM | POA: Diagnosis not present

## 2023-10-04 DIAGNOSIS — M25521 Pain in right elbow: Secondary | ICD-10-CM

## 2023-10-04 LAB — BAYER DCA HB A1C WAIVED: HB A1C (BAYER DCA - WAIVED): 8.1 % — ABNORMAL HIGH (ref 4.8–5.6)

## 2023-10-04 LAB — LIPID PANEL

## 2023-10-04 MED ORDER — DAPAGLIFLOZIN PROPANEDIOL 10 MG PO TABS: 10.0000 mg | ORAL_TABLET | Freq: Every day | ORAL | 6 refills | Status: DC

## 2023-10-04 MED ORDER — GLIPIZIDE 5 MG PO TABS: 5.0000 mg | ORAL_TABLET | Freq: Two times a day (BID) | ORAL | 3 refills | Status: DC

## 2023-10-04 MED ORDER — GLIPIZIDE 5 MG PO TABS: 5.0000 mg | ORAL_TABLET | Freq: Two times a day (BID) | ORAL | 3 refills | Status: AC

## 2023-10-04 MED ORDER — PREDNISONE 20 MG PO TABS: 40.0000 mg | ORAL_TABLET | Freq: Every day | ORAL | 0 refills | Status: AC

## 2023-10-04 MED ORDER — ATORVASTATIN CALCIUM 80 MG PO TABS: 80.0000 mg | ORAL_TABLET | Freq: Every day | ORAL | 11 refills | Status: DC

## 2023-10-04 MED ORDER — RYBELSUS 7 MG PO TABS: 7.0000 mg | ORAL_TABLET | Freq: Every day | ORAL | 6 refills | Status: DC

## 2023-10-04 NOTE — Progress Notes (Signed)
Subjective:  Patient ID: Randy Sanchez, male    DOB: 1956-05-17, 68 y.o.   MRN: [id number]  Patient Care Team: Sonny Masters, FNP as PCP - General (Family Medicine) Chilton Si, MD as PCP - Cardiology (Cardiology)   Chief Complaint:  Medical Management of Chronic Issues   HPI: [name] is a 68 y.o. male presenting on 10/04/2023 for Medical Management of Chronic Issues   Discussed the use of AI scribe software for clinical note transcription with the patient, who gave verbal consent to proceed.  History of Present Illness   The patient presents for management of chronic medical conditions and with a swollen right elbow.  He has experienced swelling in his right elbow for 11 days without any preceding trauma or injury. The swelling has slightly decreased but continues to cause pain. No other joint involvement is noted.  Recently, there was a change in his medication regimen with an increase in the dose of Entresto, which was adjusted the day before this visit. He is uncertain about his tolerance to the new dose as it was just initiated.  He has a history of diabetes, with a recent blood sugar reading of 154 mg/dL. He experiences increased thirst and is currently taking Farxiga 10 mg once daily, Rybelsus 7 mg, and is out of glipizide 5 mg. He also takes atorvastatin 80 mg and Zetia for cholesterol management.  His hypertension is managed with carvedilol and Entresto. No headaches, chest pain, or leg swelling but reports shortness of breath.  He is on atorvastatin and Zetia for hyperlipidemia, which appears to be well-tolerated.          Relevant past medical, surgical, family, and social history reviewed and updated as indicated.  Allergies and medications reviewed and updated. Data reviewed: Chart in Epic.   Past Medical History:  Diagnosis Date   Arthritis    Chronic headaches    Diabetes mellitus type II, non insulin dependent (HCC) 07/28/2021   HTN, goal  below 130/80 07/28/2021   Hyperlipidemia associated with type 2 diabetes mellitus (HCC), goal LDL < 70 07/28/2021   Ischemic cardiomyopathy 07/28/2021   NSTEMI (non-ST elevated myocardial infarction) (HCC) 07/27/2021   Tobacco abuse 12/20/2021    Past Surgical History:  Procedure Laterality Date   CORONARY STENT INTERVENTION N/A 07/27/2021   Procedure: CORONARY STENT INTERVENTION;  Surgeon: Corky Crafts, MD;  Location: [facility] INVASIVE CV [facility];  Service: Cardiovascular;  Laterality: N/A;   LEFT HEART CATH AND CORONARY ANGIOGRAPHY N/A 07/27/2021   Procedure: LEFT HEART CATH AND CORONARY ANGIOGRAPHY;  Surgeon: Corky Crafts, MD;  Location: [facility] INVASIVE CV LAB;  Service: Cardiovascular;  Laterality: N/A;    Social History   Socioeconomic History   Marital status: Widowed    Spouse name: Not on file   Number of children: 0   Years of education: Not on file   Highest education level: Not on file  Occupational History   Occupation: retired  Tobacco Use   Smoking status: Former    Current packs/day: 0.00    Types: Cigarettes    Quit date: 01/31/2022    Years since quitting: 1.6   Smokeless tobacco: Never  Vaping Use   Vaping status: Never Used  Substance and Sexual Activity   Alcohol use: Yes    Alcohol/week: 2.0 standard drinks of alcohol    Types: 2 Shots of liquor per week    Comment: 10-15 shot of Ree Kida daniel every other day   Drug use:  Not Currently    Types: Cocaine, Marijuana    Comment: last week   Sexual activity: Not Currently  Other Topics Concern   Not on file  Social History Narrative   Not on file   Social Drivers of Health   Financial Resource Strain: Not on file  Food Insecurity: Not on file  Transportation Needs: Not on file  Physical Activity: Not on file  Stress: Not on file  Social Connections: Not on file  Intimate Partner Violence: Not on file    Outpatient Encounter Medications as of 10/04/2023  Medication Sig   Accu-Chek Softclix  Lancets lancets Test BS daily and 3x as needed Dx E11.9   aspirin EC 81 MG tablet Take 81 mg by mouth daily. Swallow whole.   Blood Glucose Monitoring Suppl (ACCU-CHEK GUIDE ME) w/Device KIT Test BS daily and as needed Dx E11.9   Blood Glucose Monitoring Suppl DEVI 1 each by Does not apply route in the morning, at noon, and at bedtime. May substitute to any manufacturer covered by patient's insurance.   Blood Pressure KIT CHECK YOUR BLOOD PRESSURE TWICE A DAY   carvedilol (COREG) 25 MG tablet Take 1 tablet (25 mg total) by mouth 2 (two) times daily with a meal.   ezetimibe (ZETIA) 10 MG tablet Take 1 tablet (10 mg total) by mouth daily.   glucose blood (ACCU-CHEK GUIDE) test strip Test BS daily and 3x as needed Dx E11.9   nitroGLYCERIN (NITROSTAT) 0.4 MG SL tablet Place 1 tablet (0.4 mg total) under the tongue every 5 (five) minutes x 3 doses as needed for chest pain.   predniSONE (DELTASONE) 20 MG tablet Take 2 tablets (40 mg total) by mouth daily with breakfast for 5 days. 2 po daily for 5 days   sacubitril-valsartan (ENTRESTO) 97-103 MG Take 1 tablet by mouth 2 (two) times daily.   sildenafil (VIAGRA) 100 MG tablet Take 1 tablet (100 mg total) by mouth daily as needed for erectile dysfunction.   [DISCONTINUED] atorvastatin (LIPITOR) 80 MG tablet TAKE ONE (1) TABLET BY MOUTH EVERY DAY   [DISCONTINUED] dapagliflozin propanediol (FARXIGA) 10 MG TABS tablet Take 1 tablet (10 mg total) by mouth daily before breakfast.   [DISCONTINUED] glipiZIDE (GLUCOTROL) 5 MG tablet Take 1 tablet (5 mg total) by mouth 2 (two) times daily before a meal.   [DISCONTINUED] Semaglutide (RYBELSUS) 7 MG TABS Take 1 tablet (7 mg total) by mouth daily. DX: E11.65   atorvastatin (LIPITOR) 80 MG tablet Take 1 tablet (80 mg total) by mouth daily.   dapagliflozin propanediol (FARXIGA) 10 MG TABS tablet Take 1 tablet (10 mg total) by mouth daily before breakfast.   glipiZIDE (GLUCOTROL) 5 MG tablet Take 1 tablet (5 mg total) by  mouth 2 (two) times daily before a meal.   Semaglutide (RYBELSUS) 7 MG TABS Take 1 tablet (7 mg total) by mouth daily. DX: E11.65   [DISCONTINUED] dapagliflozin propanediol (FARXIGA) 10 MG TABS tablet Take 1 tablet (10 mg total) by mouth daily before breakfast.   [DISCONTINUED] glipiZIDE (GLUCOTROL) 5 MG tablet Take 1 tablet (5 mg total) by mouth 2 (two) times daily before a meal.   [DISCONTINUED] Semaglutide (RYBELSUS) 7 MG TABS Take 1 tablet (7 mg total) by mouth daily. DX: E11.65   No facility-administered encounter medications on file as of 10/04/2023.    No Known Allergies  Pertinent ROS per HPI, otherwise unremarkable      Objective:  BP (!) 146/82   Pulse 60   Temp (!)  97.4 F (36.3 C)   Ht 5\' 4"  (1.626 m)   Wt 168 lb (76.2 kg)   SpO2 96%   BMI 28.84 kg/m    Wt Readings from Last 3 Encounters:  10/04/23 168 lb (76.2 kg)  10/03/23 170 lb (77.1 kg)  07/04/23 166 lb 9.6 oz (75.6 kg)    Physical Exam Vitals and nursing note reviewed.  Constitutional:      Appearance: Normal appearance.  HENT:     Head: Normocephalic and atraumatic.     Nose: Nose normal.     Mouth/Throat:     Mouth: Mucous membranes are moist.  Eyes:     Conjunctiva/sclera: Conjunctivae normal.     Pupils: Pupils are equal, round, and reactive to light.  Cardiovascular:     Rate and Rhythm: Normal rate and regular rhythm.     Heart sounds: Normal heart sounds.  Pulmonary:     Effort: Pulmonary effort is normal.     Breath sounds: Normal breath sounds.  Musculoskeletal:     Right upper arm: Normal.     Right elbow: Swelling (minimal) present. No deformity, effusion or lacerations. Normal range of motion. Tenderness present in lateral epicondyle. No radial head, medial epicondyle or olecranon process tenderness.     Right forearm: Normal.     Cervical back: Neck supple.     Right lower leg: No edema.     Left lower leg: No edema.  Skin:    General: Skin is warm and dry.     Capillary Refill:  Capillary refill takes less than 2 seconds.  Neurological:     General: No focal deficit present.     Mental Status: He is alert and oriented to person, place, and time.  Psychiatric:        Mood and Affect: Mood normal.        Behavior: Behavior normal.        Thought Content: Thought content normal.        Judgment: Judgment normal.       Results for orders placed or performed in visit on 07/04/23  Bayer DCA Hb A1c Waived   Collection Time: 07/04/23  9:57 AM  Result Value Ref Range   HB A1C (BAYER DCA - WAIVED) 7.4 (H) 4.8 - 5.6 %  CMP14+EGFR   Collection Time: 07/04/23 10:26 AM  Result Value Ref Range   Glucose 177 (H) 70 - 99 mg/dL   BUN 11 8 - 27 mg/dL   Creatinine, Ser 1.61 (H) 0.76 - 1.27 mg/dL   eGFR 62 >09 UE/AVW/0.98   BUN/Creatinine Ratio 9 (L) 10 - 24   Sodium 141 134 - 144 mmol/L   Potassium 4.7 3.5 - 5.2 mmol/L   Chloride 106 96 - 106 mmol/L   CO2 21 20 - 29 mmol/L   Calcium 9.3 8.6 - 10.2 mg/dL   Total Protein 6.9 6.0 - 8.5 g/dL   Albumin 4.0 3.9 - 4.9 g/dL   Globulin, Total 2.9 1.5 - 4.5 g/dL   Bilirubin Total 0.4 0.0 - 1.2 mg/dL   Alkaline Phosphatase 78 44 - 121 IU/L   AST 17 0 - 40 IU/L   ALT 21 0 - 44 IU/L  VITAMIN D 25 Hydroxy (Vit-D Deficiency, Fractures)   Collection Time: 07/04/23 10:26 AM  Result Value Ref Range   Vit D, 25-Hydroxy 19.0 (L) 30.0 - 100.0 ng/mL       Pertinent labs & imaging results that were available during my care of the  patient were reviewed by me and considered in my medical decision making.  Assessment & Plan:  Jaceon was seen today for medical management of chronic issues.  Diagnoses and all orders for this visit:  Hyperlipidemia associated with type 2 diabetes mellitus (HCC), goal LDL < 70 -     CMP14+EGFR -     Lipid panel -     atorvastatin (LIPITOR) 80 MG tablet; Take 1 tablet (80 mg total) by mouth daily.  Hypertension associated with type 2 diabetes mellitus (HCC) -     CMP14+EGFR -     VITAMIN D 25  Hydroxy (Vit-D Deficiency, Fractures) -     Discontinue: dapagliflozin propanediol (FARXIGA) 10 MG TABS tablet; Take 1 tablet (10 mg total) by mouth daily before breakfast. -     CBC with Differential/Platelet -     Lipid panel -     Thyroid Panel With TSH -     dapagliflozin propanediol (FARXIGA) 10 MG TABS tablet; Take 1 tablet (10 mg total) by mouth daily before breakfast.  CKD stage 3 due to type 2 diabetes mellitus (HCC) -     Bayer DCA Hb A1c Waived -     CMP14+EGFR -     VITAMIN D 25 Hydroxy (Vit-D Deficiency, Fractures) -     Discontinue: dapagliflozin propanediol (FARXIGA) 10 MG TABS tablet; Take 1 tablet (10 mg total) by mouth daily before breakfast. -     CBC with Differential/Platelet -     dapagliflozin propanediol (FARXIGA) 10 MG TABS tablet; Take 1 tablet (10 mg total) by mouth daily before breakfast.  Type 2 diabetes mellitus with other specified complication, without long-term current use of insulin (HCC) -     Bayer DCA Hb A1c Waived -     CMP14+EGFR -     VITAMIN D 25 Hydroxy (Vit-D Deficiency, Fractures) -     Discontinue: dapagliflozin propanediol (FARXIGA) 10 MG TABS tablet; Take 1 tablet (10 mg total) by mouth daily before breakfast. -     Discontinue: glipiZIDE (GLUCOTROL) 5 MG tablet; Take 1 tablet (5 mg total) by mouth 2 (two) times daily before a meal. -     Discontinue: Semaglutide (RYBELSUS) 7 MG TABS; Take 1 tablet (7 mg total) by mouth daily. DX: E11.65 -     CBC with Differential/Platelet -     Lipid panel -     Thyroid Panel With TSH -     glipiZIDE (GLUCOTROL) 5 MG tablet; Take 1 tablet (5 mg total) by mouth 2 (two) times daily before a meal. -     Semaglutide (RYBELSUS) 7 MG TABS; Take 1 tablet (7 mg total) by mouth daily. DX: E11.65 -     dapagliflozin propanediol (FARXIGA) 10 MG TABS tablet; Take 1 tablet (10 mg total) by mouth daily before breakfast.  Chronic systolic heart failure (HCC) -     CMP14+EGFR -     VITAMIN D 25 Hydroxy (Vit-D  Deficiency, Fractures) -     Discontinue: Semaglutide (RYBELSUS) 7 MG TABS; Take 1 tablet (7 mg total) by mouth daily. DX: E11.65 -     CBC with Differential/Platelet -     Lipid panel -     Semaglutide (RYBELSUS) 7 MG TABS; Take 1 tablet (7 mg total) by mouth daily. DX: E11.65  Severe needle phobia -     Discontinue: Semaglutide (RYBELSUS) 7 MG TABS; Take 1 tablet (7 mg total) by mouth daily. DX: E11.65 -     Semaglutide (RYBELSUS) 7 MG  TABS; Take 1 tablet (7 mg total) by mouth daily. DX: E11.65  Coronary artery disease of native artery of native heart with stable angina pectoris (HCC) -     Bayer DCA Hb A1c Waived -     CMP14+EGFR -     VITAMIN D 25 Hydroxy (Vit-D Deficiency, Fractures) -     Discontinue: Semaglutide (RYBELSUS) 7 MG TABS; Take 1 tablet (7 mg total) by mouth daily. DX: E11.65 -     CBC with Differential/Platelet -     Lipid panel -     Thyroid Panel With TSH -     Semaglutide (RYBELSUS) 7 MG TABS; Take 1 tablet (7 mg total) by mouth daily. DX: E11.65  Vitamin D deficiency -     CMP14+EGFR -     VITAMIN D 25 Hydroxy (Vit-D Deficiency, Fractures)  Right elbow pain -     predniSONE (DELTASONE) 20 MG tablet; Take 2 tablets (40 mg total) by mouth daily with breakfast for 5 days. 2 po daily for 5 days     Assessment and Plan    Right Elbow Swelling Swelling in the right elbow for 11 days without trauma. Pain and swelling have slightly decreased. NSAIDs contraindicated due to [facility] use. Discussed prednisone burst as an alternative, with a need to monitor blood sugars closely due to potential hyperglycemia. - Prescribe prednisone burst: 2 pills every morning for 5 days - Monitor blood sugars closely - Return for evaluation if no improvement in elbow swelling  Type 2 Diabetes Mellitus Blood sugar readings around 150 mg/dL with increased thirst. Currently on Farxiga, Rybelsus, and out of glipizide. Discussed importance of monitoring blood sugar levels closely during  prednisone treatment and the need to check A1c and adjust medications if necessary. - Refill glipizide 5 mg - Continue Farxiga 10 mg once daily - Continue Rybelsus 7 mg - Monitor blood sugar levels closely during prednisone treatment - Check A1c and adjust medications if necessary  Heart Failure On Entresto and carvedilol. Recent dose adjustment of Entresto by cardiologist. No current symptoms of headaches, chest pain, leg swelling, or shortness of breath. Discussed monitoring for new symptoms or side effects from the increased dose. - Continue Entresto and carvedilol as prescribed - Monitor for new symptoms or side effects from the increased dose of Entresto  Hyperlipidemia Currently on atorvastatin 80 mg and Zetia. No new issues reported. - Continue atorvastatin 80 mg - Continue Zetia  General Health Maintenance Routine follow-up and medication management. - Send in refills for all medications - Schedule follow-up appointment in 3 months  Follow-up - Return for follow-up in 3 months - Return sooner if elbow swelling does not improve.        Total time spent with patient 40 minutes.  Greater than 50% of encounter spent in coordination of care/counseling.   Continue all other maintenance medications.  Follow up plan: Return in about 3 months (around 01/02/2024) for DM.   Continue healthy lifestyle choices, including diet (rich in fruits, vegetables, and lean proteins, and low in salt and simple carbohydrates) and exercise (at least 30 minutes of moderate physical activity daily).  Educational handout given for DM  The above assessment and management plan was discussed with the patient. The patient verbalized understanding of and has agreed to the management plan. Patient is aware to call the clinic if they develop any new symptoms or if symptoms persist or worsen. Patient is aware when to return to the clinic for a follow-up visit. Patient educated  on when it is appropriate to  go to the emergency department.   Kari Baars, FNP-C [facility] [phone]

## 2023-10-04 NOTE — Patient Instructions (Addendum)

## 2023-10-05 LAB — CBC WITH DIFFERENTIAL/PLATELET
Basophils Absolute: 0.1 10*3/uL (ref 0.0–0.2)
Basos: 1 %
EOS (ABSOLUTE): 0.3 10*3/uL (ref 0.0–0.4)
Eos: 4 %
Hematocrit: 45.6 % (ref 37.5–51.0)
Hemoglobin: 14.7 g/dL (ref 13.0–17.7)
Immature Grans (Abs): 0 10*3/uL (ref 0.0–0.1)
Immature Granulocytes: 0 %
Lymphocytes Absolute: 2 10*3/uL (ref 0.7–3.1)
Lymphs: 30 %
MCH: 27.7 pg (ref 26.6–33.0)
MCHC: 32.2 g/dL (ref 31.5–35.7)
MCV: 86 fL (ref 79–97)
Monocytes Absolute: 0.8 10*3/uL (ref 0.1–0.9)
Monocytes: 12 %
Neutrophils Absolute: 3.4 10*3/uL (ref 1.4–7.0)
Neutrophils: 53 %
Platelets: 365 10*3/uL (ref 150–450)
RBC: 5.3 x10E6/uL (ref 4.14–5.80)
RDW: 13.3 % (ref 11.6–15.4)
WBC: 6.5 10*3/uL (ref 3.4–10.8)

## 2023-10-05 LAB — CMP14+EGFR
ALT: 16 IU/L (ref 0–44)
AST: 18 IU/L (ref 0–40)
Albumin: 3.8 g/dL — ABNORMAL LOW (ref 3.9–4.9)
Alkaline Phosphatase: 85 IU/L (ref 44–121)
BUN/Creatinine Ratio: 14 (ref 10–24)
BUN: 17 mg/dL (ref 8–27)
Bilirubin Total: 0.2 mg/dL (ref 0.0–1.2)
CO2: 21 mmol/L (ref 20–29)
Calcium: 9.3 mg/dL (ref 8.6–10.2)
Chloride: 106 mmol/L (ref 96–106)
Creatinine, Ser: 1.23 mg/dL (ref 0.76–1.27)
Globulin, Total: 3 g/dL (ref 1.5–4.5)
Glucose: 141 mg/dL — ABNORMAL HIGH (ref 70–99)
Potassium: 4.4 mmol/L (ref 3.5–5.2)
Sodium: 142 mmol/L (ref 134–144)
Total Protein: 6.8 g/dL (ref 6.0–8.5)
eGFR: 65 mL/min/{1.73_m2} (ref >59)

## 2023-10-05 LAB — THYROID PANEL WITH TSH
Free Thyroxine Index: 2.8 (ref 1.2–4.9)
T3 Uptake Ratio: 33 % (ref 24–39)
T4, Total: 8.5 ug/dL (ref 4.5–12.0)
TSH: 5.76 u[IU]/mL — ABNORMAL HIGH (ref 0.450–4.500)

## 2023-10-05 LAB — LIPID PANEL
Cholesterol, Total: 167 mg/dL (ref 100–199)
HDL: 39 mg/dL — ABNORMAL LOW (ref >39)
LDL CALC COMMENT:: 4.3 ratio (ref 0.0–5.0)
LDL Chol Calc (NIH): 104 mg/dL — ABNORMAL HIGH (ref 0–99)
Triglycerides: 136 mg/dL (ref 0–149)
VLDL Cholesterol Cal: 24 mg/dL (ref 5–40)

## 2023-10-05 LAB — VITAMIN D 25 HYDROXY (VIT D DEFICIENCY, FRACTURES): Vit D, 25-Hydroxy: 10.6 ng/mL — ABNORMAL LOW (ref 30.0–100.0)

## 2023-10-05 MED ORDER — VITAMIN D (ERGOCALCIFEROL) 1.25 MG (50000 UNIT) PO CAPS: 50000.0000 [IU] | ORAL_CAPSULE | ORAL | 3 refills | Status: AC

## 2023-10-05 NOTE — Addendum Note (Signed)
Addended by: Sonny Masters on: 10/05/2023 01:46 PM   Modules accepted: Orders

## 2023-10-20 ENCOUNTER — Other Ambulatory Visit: Payer: Medicare HMO

## 2023-10-30 ENCOUNTER — Telehealth: Payer: Self-pay | Admitting: Family Medicine

## 2023-10-30 NOTE — Telephone Encounter (Signed)
 Copied from CRM [id number]. Topic: Clinical - Lab/Test Results >> Oct 30, 2023 11:22 AM Shelah Lewandowsky wrote: Reason for CRM: Patient finished Vitamin D prescription on Friday and wants to know if he needs to get labs done before the wellness visit, please call Liborio Nixon [phone]

## 2023-10-30 NOTE — Telephone Encounter (Signed)
 Vmb not set up Randy Sanchez 02/24. He does not need labs before appt

## 2023-11-02 DIAGNOSIS — Z5181 Encounter for therapeutic drug level monitoring: Secondary | ICD-10-CM | POA: Diagnosis not present

## 2023-11-02 DIAGNOSIS — I10 Essential (primary) hypertension: Secondary | ICD-10-CM | POA: Diagnosis not present

## 2023-11-03 ENCOUNTER — Telehealth (HOSPITAL_BASED_OUTPATIENT_CLINIC_OR_DEPARTMENT_OTHER): Payer: Self-pay

## 2023-11-03 DIAGNOSIS — Z5181 Encounter for therapeutic drug level monitoring: Secondary | ICD-10-CM

## 2023-11-03 DIAGNOSIS — I10 Essential (primary) hypertension: Secondary | ICD-10-CM

## 2023-11-03 LAB — BASIC METABOLIC PANEL
BUN/Creatinine Ratio: 13 (ref 10–24)
BUN: 19 mg/dL (ref 8–27)
CO2: 22 mmol/L (ref 20–29)
Calcium: 9.3 mg/dL (ref 8.6–10.2)
Chloride: 104 mmol/L (ref 96–106)
Creatinine, Ser: 1.46 mg/dL — ABNORMAL HIGH (ref 0.76–1.27)
Glucose: 155 mg/dL — ABNORMAL HIGH (ref 70–99)
Potassium: 4.6 mmol/L (ref 3.5–5.2)
Sodium: 139 mmol/L (ref 134–144)
eGFR: 53 mL/min/{1.73_m2} — ABNORMAL LOW (ref >59)

## 2023-11-03 NOTE — Telephone Encounter (Addendum)
 Called patient, reviewed results with patient. He will go get labs in two weeks. He states his blood pressure is going down, he has been calmer and meditating. He states his friend has been helping him check his pressures so he will have to ask him what numbers he has been getting. He will call back with these numbers.    ----- Message from Alver Sorrow sent at 11/03/2023  7:52 AM EST ----- Slight increase in creatinine expected with increased dose Entresto. Recommend repeat BMP in 2 weeks for monitoring to ensure stable. If he will provide BP log since increasing dose of Entresto.

## 2023-11-07 ENCOUNTER — Encounter: Payer: Medicare HMO | Admitting: Family Medicine

## 2023-11-10 ENCOUNTER — Telehealth: Payer: Self-pay | Admitting: Pharmacist

## 2023-11-10 NOTE — Telephone Encounter (Signed)
 Patient was identified as falling into the [place] Measure - Diabetes.   Patient was: Appointment scheduled with primary care provider in the next 30 days.  Referred to pharmacy for chronic disease management.   Appt made with PharmD in 2 weeks for DM  Kieth Brightly, PharmD, BCACP, CPP Clinical Pharmacist, [facility]

## 2023-11-28 ENCOUNTER — Other Ambulatory Visit

## 2023-11-28 ENCOUNTER — Telehealth: Payer: Self-pay | Admitting: Pharmacist

## 2023-11-28 NOTE — Telephone Encounter (Signed)
 Patient was identified as falling into the [place] Measure - Diabetes.   Patient was: Referred to pharmacy for chronic disease management.   Unsuccessful outreach to patient today; no VM available; will f/u; next PCP not until 01/2024  Randy Sanchez, PharmD, BCACP, CPP Clinical Pharmacist, [facility]

## 2024-01-02 ENCOUNTER — Ambulatory Visit (HOSPITAL_BASED_OUTPATIENT_CLINIC_OR_DEPARTMENT_OTHER): Payer: Medicare HMO | Admitting: Family

## 2024-01-02 ENCOUNTER — Encounter (HOSPITAL_BASED_OUTPATIENT_CLINIC_OR_DEPARTMENT_OTHER): Payer: Self-pay | Admitting: Family

## 2024-01-02 VITALS — BP 140/78 | HR 64 | Ht 64.0 in | Wt 164.8 lb

## 2024-01-02 DIAGNOSIS — I10 Essential (primary) hypertension: Secondary | ICD-10-CM

## 2024-01-02 DIAGNOSIS — E785 Hyperlipidemia, unspecified: Secondary | ICD-10-CM | POA: Diagnosis not present

## 2024-01-02 DIAGNOSIS — I5022 Chronic systolic (congestive) heart failure: Secondary | ICD-10-CM | POA: Diagnosis not present

## 2024-01-02 DIAGNOSIS — I25118 Atherosclerotic heart disease of native coronary artery with other forms of angina pectoris: Secondary | ICD-10-CM | POA: Diagnosis not present

## 2024-01-02 DIAGNOSIS — Z5181 Encounter for therapeutic drug level monitoring: Secondary | ICD-10-CM | POA: Diagnosis not present

## 2024-01-02 DIAGNOSIS — I255 Ischemic cardiomyopathy: Secondary | ICD-10-CM | POA: Diagnosis not present

## 2024-01-02 LAB — BASIC METABOLIC PANEL WITH GFR
BUN/Creatinine Ratio: 13 (ref 10–24)
BUN: 27 mg/dL (ref 8–27)
CO2: 18 mmol/L — ABNORMAL LOW (ref 20–29)
Calcium: 9.5 mg/dL (ref 8.6–10.2)
Chloride: 105 mmol/L (ref 96–106)
Creatinine, Ser: 2.02 mg/dL — ABNORMAL HIGH (ref 0.76–1.27)
Glucose: 192 mg/dL — ABNORMAL HIGH (ref 70–99)
Potassium: 5.3 mmol/L — ABNORMAL HIGH (ref 3.5–5.2)
Sodium: 139 mmol/L (ref 134–144)
eGFR: 36 mL/min/{1.73_m2} — ABNORMAL LOW (ref >59)

## 2024-01-02 MED ORDER — NITROGLYCERIN 0.4 MG SL SUBL: 0.4000 mg | SUBLINGUAL_TABLET | SUBLINGUAL | 4 refills | Status: AC | PRN

## 2024-01-02 NOTE — Progress Notes (Signed)
 Cardiology Office Note:  .   Date:  01/02/2024  ID:  Randy Sanchez, DOB 07-08-1956, MRN [id number] PCP: Galvin Jules, FNP  [facility] Providers Cardiologist:  Maudine Sos, MD    History of Present Illness: .   Randy Sanchez is a 68 y.o. male with a hx of coronary disease s/p DES to Cx 07/2021, hypertension, ICM, HFrEF, hyperlipidemia, tobacco use, diabetes, OSA (declines CPAP).    He presented to the ED 07/25/2021 with chest pain with troponin peak >24,000.  Cardiac catheterization showed multivessel disease with culprit lesion being OM1 treated with PTCA.  Also 80% circumflex treated with DES.  Echo during admission showed LVEF 40-45%.  He had not been on any medications since 2018.  Jardiance  was started for diabetes.  Entresto , carvedilol  started for HFrEF and HTN.  Aspirin , statin, Imdur , beta-blocker started for coronary disease. Repeat echocardiogram 11/2021 LVEF 40-45%. At visit 12/20/21 with Dr. Theodis Fiscal BP not at goal, Entresto  increased.    At telehealth visit 06/21/22 Imdur  stopped due to ED. As no recurrent chest pain, he was given Rx for Sildenafil . Seen 12/23/2022 with BP not at goal and carvedilol  was increased to 12.5 mg twice daily. At visit 03/24/23. He was inadvertently taking Imdur  which had previously been stopped for ED to allow for Sildenafil , it was stopped. Coreg  increased to 25mg  BID for BP. Last seen 06/23/23 with BP not at goal, was taking Coreg  12.5mg  instead of 25 mg dose and it was increased.   Last seen 10/03/23 with BP not at goal, Entresto  increased to 97-103mg  BID.   Presents today for follow-up.  Isolated episode of chest pain at rest which resolved with Aspirin  81mg . No exertional chest pain, pressure, tightness. No dyspnea, lightheadedness, dizziness, near syncope, syncope, palpitations, LE edema, orthopnea, PND.  Continues to walk for exercise. Eats predominantly at home and sister assists with meals.   ROS: Please see the history of present  illness.    All other systems reviewed and are negative.   Studies Reviewed: Aaron Aas   EKG Interpretation Date/Time:  Tuesday January 02 2024 09:08:05 EDT Ventricular Rate:  64 PR Interval:  188 QRS Duration:  90 QT Interval:  418 QTC Calculation: 431 R Axis:   -25  Text Interpretation: Normal sinus rhythm  Lateral TWI stable from previous. Confirmed by Neomi Banks ([id number]) on 01/02/2024 1:19:14 PM       Risk Assessment/Calculations:     HYPERTENSION CONTROL Vitals:   01/02/24 0903 01/02/24 0948  BP: 110/72 (!) 140/78    The patient's blood pressure is elevated above target today.  In order to address the patient's elevated BP:           Physical Exam:   VS:  BP (!) 140/78   Pulse 64   Ht 5\' 4"  (1.626 m)   Wt 164 lb 12.8 oz (74.8 kg)   SpO2 99%   BMI 28.29 kg/m    Wt Readings from Last 3 Encounters:  01/02/24 164 lb 12.8 oz (74.8 kg)  10/04/23 168 lb (76.2 kg)  10/03/23 170 lb (77.1 kg)    Vitals:   01/02/24 0903 01/02/24 0948  BP: 110/72 (!) 140/78  Pulse: 64   Height: 5\' 4"  (1.626 m)   Weight: 164 lb 12.8 oz (74.8 kg)   SpO2: 99%   BMI (Calculated): 28.27    GEN: Well nourished, well developed in no acute distress NECK: No JVD; No carotid bruits CARDIAC: RRR, no murmurs, rubs, gallops RESPIRATORY:  Clear to  auscultation without rales, wheezing or rhonchi  ABDOMEN: Soft, non-tender, non-distended EXTREMITIES: No edema. No deformity.  ASSESSMENT AND PLAN: .    CAD s/p DES Cx 07/2021 / HLD, LDL goal <70 / ED - Stable with no anginal symptoms. No indication for ischemic evaluation.  EKG stable T wave inversion in lateral leads.   Continue GDMT aspirin  81 mg daily, Coreg  25 mg twice daily, Atorvastatin  80 mg daily, Zetia  10 mg daily, Farxiga  10 mg daily. Imdur  previously stopped due to ED requiring Sildenafil .  Recommend aiming for 150 minutes of moderate intensity activity per week and following a heart healthy diet.     10/04/23 LDL of 104 previously 69. Notes  he may have missed doses of Atorvastatin  or Zetia . He will ensure his pill bottles at home are correct. Consider repeat lipid at follow up in 6 months   DM2 - Continue to follow with PCP.    HTN -initial BP 110/72 with repeat 140/78.  Has not yet taken his medications today.  Continue Entresto  97/103mg  BID, carvedilol  25 mg twice daily.  Update BMP for monitoring. If additional agent needed in future, consider Spironolactone .    ICM / HFrEF - 11/2021 LVEF 40-45%. Euvolemic and well compensated on exam.  Continue GDMT Coreg , Farxiga , Entresto . Consider Spironolactone  if needed in future for BP control.  Deferred today as he is euvolemic.. Careful avoidance of polypharmacy. Low sodium diet, fluid restriction <2L, and daily weights encouraged. Educated to contact our office for weight gain of 2 lbs overnight or 5 lbs in one week.    Tobacco use - Continued smoking cessation encouraged. Recommend utilization of [id number].          Dispo: follow up in 6 mos  Signed, Clearnce Curia, NP

## 2024-01-02 NOTE — Patient Instructions (Addendum)
 Medication Instructions:  Your physician recommends that you continue on your current medications as directed. Please refer to the Current Medication list given to you today.  PLEASE ENSURE YOU ARE TAKING BOTH ATORVASTATIN  AND ZETIA    *If you need a refill on your cardiac medications before your next appointment, please call your pharmacy*  Lab Work: LAB TODAY- BMP   Follow-Up: At [facility], you and your health needs are our priority.  As part of our continuing mission to provide you with exceptional heart care, our providers are all part of one team.  This team includes your primary Cardiologist (physician) and Advanced Practice Providers or APPs (Physician Assistants and Nurse Practitioners) who all work together to provide you with the care you need, when you need it.  Please follow up in with Dr. Theodis Fiscal, Slater Duncan, NP or Neomi Banks, NP

## 2024-01-04 ENCOUNTER — Encounter: Payer: Self-pay | Admitting: Family Medicine

## 2024-01-04 ENCOUNTER — Telehealth (HOSPITAL_BASED_OUTPATIENT_CLINIC_OR_DEPARTMENT_OTHER): Payer: Self-pay

## 2024-01-04 ENCOUNTER — Ambulatory Visit: Payer: Medicare HMO | Admitting: Family Medicine

## 2024-01-04 VITALS — BP 172/100 | HR 69 | Temp 97.6°F | Ht 64.0 in | Wt 164.8 lb

## 2024-01-04 DIAGNOSIS — E1159 Type 2 diabetes mellitus with other circulatory complications: Secondary | ICD-10-CM

## 2024-01-04 DIAGNOSIS — I25118 Atherosclerotic heart disease of native coronary artery with other forms of angina pectoris: Secondary | ICD-10-CM

## 2024-01-04 DIAGNOSIS — N183 Chronic kidney disease, stage 3 unspecified: Secondary | ICD-10-CM | POA: Diagnosis not present

## 2024-01-04 DIAGNOSIS — R7989 Other specified abnormal findings of blood chemistry: Secondary | ICD-10-CM | POA: Diagnosis not present

## 2024-01-04 DIAGNOSIS — I152 Hypertension secondary to endocrine disorders: Secondary | ICD-10-CM

## 2024-01-04 DIAGNOSIS — E785 Hyperlipidemia, unspecified: Secondary | ICD-10-CM

## 2024-01-04 DIAGNOSIS — E559 Vitamin D deficiency, unspecified: Secondary | ICD-10-CM

## 2024-01-04 DIAGNOSIS — E1169 Type 2 diabetes mellitus with other specified complication: Secondary | ICD-10-CM

## 2024-01-04 DIAGNOSIS — I5022 Chronic systolic (congestive) heart failure: Secondary | ICD-10-CM | POA: Diagnosis not present

## 2024-01-04 DIAGNOSIS — E1122 Type 2 diabetes mellitus with diabetic chronic kidney disease: Secondary | ICD-10-CM | POA: Diagnosis not present

## 2024-01-04 DIAGNOSIS — Z7984 Long term (current) use of oral hypoglycemic drugs: Secondary | ICD-10-CM

## 2024-01-04 DIAGNOSIS — Z23 Encounter for immunization: Secondary | ICD-10-CM

## 2024-01-04 LAB — BAYER DCA HB A1C WAIVED: HB A1C (BAYER DCA - WAIVED): 8.1 % — ABNORMAL HIGH (ref 4.8–5.6)

## 2024-01-04 MED ORDER — RYBELSUS 14 MG PO TABS
14.0000 mg | ORAL_TABLET | Freq: Every day | ORAL | 3 refills | Status: DC
Start: 2024-01-04 — End: 2024-02-07

## 2024-01-04 NOTE — Telephone Encounter (Addendum)
 2nd call attempt, no answer, unable to leave message due to VM not being set up.    ----- Message from Clearnce Curia sent at 01/03/2024  2:53 PM EDT ----- Kidney function significantly decreased from prior. Potassium mildly elevated. Ensure not taking potassium supplement.   Hold Entresto  for 2 days. Then resume at reduced dose 49-51mg  BID. Repeat BMET in 1 week for monitoring (likely will require reminder call). Ensure staying well hydrated.   Recommend referral to nephrology in [place] at [facility]. Phone [phone]. Fax [phone]]

## 2024-01-04 NOTE — Patient Instructions (Signed)

## 2024-01-04 NOTE — Progress Notes (Signed)
 Subjective:  Patient ID: Randy Sanchez, male    DOB: Apr 16, 1956, 68 y.o.   MRN: [id number]  Patient Care Team: Galvin Jules, FNP as PCP - General (Family Medicine) Maudine Sos, MD as PCP - Cardiology (Cardiology)   Chief Complaint:  Diabetes (3 month follow up )   HPI: Randy Sanchez is a 69 y.o. male presenting on 01/04/2024 for Diabetes (3 month follow up )   History of Present Illness   Randy Sanchez is a 68 year old male with diabetes and heart failure who presents for routine follow-up.  He experiences thirst but does not provide specific blood sugar readings. He is currently taking Farxiga  and Rybelsus  but is unsure about his use of glipizide . His A1c remains at 8.1, consistent with previous measurements. No increased hunger or urination. He experienced a single episode of dizziness while working but has not had recurrent episodes.  He denies consuming potatoes but dislikes wheat bread, which affects his sandwich taste. No side effects from Rybelsus  and is compliant with his medication regimen, including Entresto  for heart failure. He saw his cardiologist yesterday and underwent an EKG.  No leg swelling, shortness of breath, or chest pain. Urine output is normal. He is scheduled to see his kidney doctor on the 29th of the month. His TSH was previously elevated, and he will have it rechecked today. No significant fatigue beyond normal levels.          Relevant past medical, surgical, family, and social history reviewed and updated as indicated.  Allergies and medications reviewed and updated. Data reviewed: Chart in Epic.   Past Medical History:  Diagnosis Date   Arthritis    Chronic headaches    Diabetes mellitus type II, non insulin  dependent (HCC) 07/28/2021   HTN, goal below 130/80 07/28/2021   Hyperlipidemia associated with type 2 diabetes mellitus (HCC), goal LDL < 70 07/28/2021   Ischemic cardiomyopathy 07/28/2021   NSTEMI (non-ST elevated myocardial  infarction) (HCC) 07/27/2021   Tobacco abuse 12/20/2021    Past Surgical History:  Procedure Laterality Date   CORONARY STENT INTERVENTION N/A 07/27/2021   Procedure: CORONARY STENT INTERVENTION;  Surgeon: Lucendia Rusk, MD;  Location: [facility] INVASIVE CV [facility];  Service: Cardiovascular;  Laterality: N/A;   LEFT HEART CATH AND CORONARY ANGIOGRAPHY N/A 07/27/2021   Procedure: LEFT HEART CATH AND CORONARY ANGIOGRAPHY;  Surgeon: Lucendia Rusk, MD;  Location: [facility] INVASIVE CV LAB;  Service: Cardiovascular;  Laterality: N/A;    Social History   Socioeconomic History   Marital status: Widowed    Spouse name: Not on file   Number of children: 0   Years of education: Not on file   Highest education level: Not on file  Occupational History   Occupation: retired  Tobacco Use   Smoking status: Former    Current packs/day: 0.00    Types: Cigarettes    Quit date: 01/31/2022    Years since quitting: 1.9   Smokeless tobacco: Never  Vaping Use   Vaping status: Never Used  Substance and Sexual Activity   Alcohol use: Yes    Alcohol/week: 2.0 standard drinks of alcohol    Types: 2 Shots of liquor per week    Comment: 10-15 shot of Jack daniel every other day   Drug use: Not Currently    Types: Cocaine, Marijuana    Comment: last week   Sexual activity: Not Currently  Other Topics Concern   Not on file  Social History Narrative  Not on file   Social Drivers of Health   Financial Resource Strain: Not on file  Food Insecurity: Not on file  Transportation Needs: Not on file  Physical Activity: Not on file  Stress: Not on file  Social Connections: Not on file  Intimate Partner Violence: Not on file    Outpatient Encounter Medications as of 01/04/2024  Medication Sig   Accu-Chek Softclix Lancets lancets Test BS daily and 3x as needed Dx E11.9   aspirin  EC 81 MG tablet Take 81 mg by mouth daily. Swallow whole.   atorvastatin  (LIPITOR ) 80 MG tablet Take 1 tablet (80 mg total) by  mouth daily.   Blood Glucose Monitoring Suppl (ACCU-CHEK GUIDE ME) w/Device KIT Test BS daily and as needed Dx E11.9   Blood Glucose Monitoring Suppl DEVI 1 each by Does not apply route in the morning, at noon, and at bedtime. May substitute to any manufacturer covered by patient's insurance.   Blood Pressure KIT CHECK YOUR BLOOD PRESSURE TWICE A DAY   carvedilol  (COREG ) 25 MG tablet Take 1 tablet (25 mg total) by mouth 2 (two) times daily with a meal.   dapagliflozin  propanediol (FARXIGA ) 10 MG TABS tablet Take 1 tablet (10 mg total) by mouth daily before breakfast.   ezetimibe  (ZETIA ) 10 MG tablet Take 1 tablet (10 mg total) by mouth daily.   glipiZIDE  (GLUCOTROL ) 5 MG tablet Take 1 tablet (5 mg total) by mouth 2 (two) times daily before a meal.   glucose blood (ACCU-CHEK GUIDE) test strip Test BS daily and 3x as needed Dx E11.9   nitroGLYCERIN  (NITROSTAT ) 0.4 MG SL tablet Place 1 tablet (0.4 mg total) under the tongue every 5 (five) minutes x 3 doses as needed for chest pain.   sacubitril -valsartan  (ENTRESTO ) 97-103 MG Take 1 tablet by mouth 2 (two) times daily.   Semaglutide  (RYBELSUS ) 14 MG TABS Take 1 tablet (14 mg total) by mouth daily.   sildenafil  (VIAGRA ) 100 MG tablet Take 1 tablet (100 mg total) by mouth daily as needed for erectile dysfunction.   Vitamin D , Ergocalciferol , (DRISDOL ) 1.25 MG (50000 UNIT) CAPS capsule Take 1 capsule (50,000 Units total) by mouth every 7 (seven) days.   [DISCONTINUED] Semaglutide  (RYBELSUS ) 7 MG TABS Take 1 tablet (7 mg total) by mouth daily. DX: E11.65   No facility-administered encounter medications on file as of 01/04/2024.    No Known Allergies  Pertinent ROS per HPI, otherwise unremarkable      Objective:  BP (!) 172/100   Pulse 69   Temp 97.6 F (36.4 C)   Ht 5\' 4"  (1.626 m)   Wt 164 lb 12.8 oz (74.8 kg)   SpO2 98%   BMI 28.29 kg/m    Wt Readings from Last 3 Encounters:  01/04/24 164 lb 12.8 oz (74.8 kg)  01/02/24 164 lb 12.8 oz  (74.8 kg)  10/04/23 168 lb (76.2 kg)    Physical Exam Vitals and nursing note reviewed.  Constitutional:      General: He is not in acute distress.    Appearance: Normal appearance. He is well-developed and well-groomed. He is not ill-appearing, toxic-appearing or diaphoretic.  HENT:     Head: Normocephalic and atraumatic.     Jaw: There is normal jaw occlusion.     Right Ear: Hearing normal.     Left Ear: Hearing normal.     Nose: Nose normal.     Mouth/Throat:     Lips: Pink.     Mouth: Mucous membranes  are moist.     Pharynx: Oropharynx is clear. Uvula midline.  Eyes:     General: Lids are normal.     Extraocular Movements: Extraocular movements intact.     Conjunctiva/sclera: Conjunctivae normal.     Pupils: Pupils are equal, round, and reactive to light.  Neck:     Thyroid : No thyroid  mass, thyromegaly or thyroid  tenderness.     Vascular: No carotid bruit or JVD.     Trachea: Trachea and phonation normal.  Cardiovascular:     Rate and Rhythm: Normal rate and regular rhythm.     Chest Wall: PMI is not displaced.     Pulses: Normal pulses.          Dorsalis pedis pulses are 2+ on the right side and 2+ on the left side.       Posterior tibial pulses are 2+ on the right side and 2+ on the left side.     Heart sounds: Normal heart sounds. No murmur heard.    No friction rub. No gallop.  Pulmonary:     Effort: Pulmonary effort is normal. No respiratory distress.     Breath sounds: Normal breath sounds. No wheezing.  Abdominal:     General: Bowel sounds are normal. There is no distension or abdominal bruit.     Palpations: Abdomen is soft. There is no hepatomegaly or splenomegaly.     Tenderness: There is no abdominal tenderness. There is no right CVA tenderness or left CVA tenderness.     Hernia: No hernia is present.  Musculoskeletal:        General: Normal range of motion.     Cervical back: Normal range of motion and neck supple.     Right lower leg: No edema.      Left lower leg: No edema.     Right foot: Normal range of motion. No deformity, bunion, Charcot foot, foot drop or prominent metatarsal heads.     Left foot: Normal range of motion. No deformity, bunion, Charcot foot, foot drop or prominent metatarsal heads.  Feet:     Right foot:     Protective Sensation: 10 sites tested.  10 sites sensed.     Skin integrity: Skin integrity normal.     Toenail Condition: Right toenails are normal.     Left foot:     Protective Sensation: 10 sites tested.  10 sites sensed.     Skin integrity: Skin integrity normal.     Toenail Condition: Left toenails are normal.  Lymphadenopathy:     Cervical: No cervical adenopathy.  Skin:    General: Skin is warm and dry.     Capillary Refill: Capillary refill takes less than 2 seconds.     Coloration: Skin is not cyanotic, jaundiced or pale.     Findings: No rash.  Neurological:     General: No focal deficit present.     Mental Status: He is alert and oriented to person, place, and time.     Sensory: Sensation is intact.     Motor: Motor function is intact.     Coordination: Coordination is intact.     Gait: Gait is intact.     Deep Tendon Reflexes: Reflexes are normal and symmetric.  Psychiatric:        Attention and Perception: Attention and perception normal.        Mood and Affect: Mood and affect normal.        Speech: Speech normal.  Behavior: Behavior normal. Behavior is cooperative.        Thought Content: Thought content normal.        Cognition and Memory: Cognition and memory normal.        Judgment: Judgment normal.     Results for orders placed or performed in visit on 11/03/23  Basic metabolic panel   Collection Time: 01/02/24 10:03 AM  Result Value Ref Range   Glucose 192 (H) 70 - 99 mg/dL   BUN 27 8 - 27 mg/dL   Creatinine, Ser 4.09 (H) 0.76 - 1.27 mg/dL   eGFR 36 (L) >81 XB/JYN/8.29   BUN/Creatinine Ratio 13 10 - 24   Sodium 139 134 - 144 mmol/L   Potassium 5.3 (H) 3.5 - 5.2  mmol/L   Chloride 105 96 - 106 mmol/L   CO2 18 (L) 20 - 29 mmol/L   Calcium  9.5 8.6 - 10.2 mg/dL       Pertinent labs & imaging results that were available during my care of the patient were reviewed by me and considered in my medical decision making.  Assessment & Plan:  Aydan was seen today for diabetes.  Diagnoses and all orders for this visit:  Type 2 diabetes mellitus with other specified complication, without long-term current use of insulin  (HCC) -     Bayer DCA Hb A1c Waived -     CBC with Differential/Platelet -     CMP14+EGFR -     Lipid panel -     Microalbumin / creatinine urine ratio -     Semaglutide  (RYBELSUS ) 14 MG TABS; Take 1 tablet (14 mg total) by mouth daily.  Hypertension associated with type 2 diabetes mellitus (HCC) -     CBC with Differential/Platelet -     CMP14+EGFR -     Lipid panel -     Microalbumin / creatinine urine ratio  CKD stage 3 due to type 2 diabetes mellitus (HCC) -     CBC with Differential/Platelet -     CMP14+EGFR -     Vitamin D , 25-hydroxy -     Microalbumin / creatinine urine ratio  Chronic systolic heart failure (HCC) -     CBC with Differential/Platelet -     CMP14+EGFR  Hyperlipidemia associated with type 2 diabetes mellitus (HCC) -     CMP14+EGFR -     Lipid panel  Vitamin D  deficiency -     CMP14+EGFR -     Vitamin D , 25-hydroxy  Coronary artery disease of native artery of native heart with stable angina pectoris (HCC) -     CBC with Differential/Platelet -     Lipid panel  Elevated TSH -     Thyroid  Function Panel (THS+T3+T4+Free)  Need for vaccination -     Zoster Recombinant (Shingrix  )     Assessment and Plan    Type 2 diabetes mellitus with hyperglycemia A1c remains at 8.1, indicating suboptimal control. Reports increased thirst without increased hunger or urination. Currently taking Farxiga  and Rybelsus , but unsure about glipizide . No significant side effects from Rybelsus . Plan to increase Rybelsus   dose to improve glycemic control and benefit heart failure management. - Increase Rybelsus  (semaglutide ) to 14 mg daily. - Instruct to take two 7 mg Rybelsus  tablets until current supply is exhausted, then switch to 14 mg tablets. - Discuss dietary habits, emphasizing reduction of high-carbohydrate foods. - Order urine test to check for proteinuria.  Heart failure Under management with Entresto . Recent cardiology visit with EKG performed,  no abnormalities reported. Rybelsus  is beneficial for heart failure management. - Continue current heart failure management with Entresto . - Confirm recent cardiology follow-up and EKG results.  Elevated TSH Previously noted elevated TSH, indicating potential thyroid  dysfunction. No new symptoms of fatigue reported. - Repeat TSH test to evaluate thyroid  function.          Continue all other maintenance medications.  Follow up plan: Return in about 3 months (around 04/05/2024) for DM.   Continue healthy lifestyle choices, including diet (rich in fruits, vegetables, and lean proteins, and low in salt and simple carbohydrates) and exercise (at least 30 minutes of moderate physical activity daily).  Educational handout given for DM  The above assessment and management plan was discussed with the patient. The patient verbalized understanding of and has agreed to the management plan. Patient is aware to call the clinic if they develop any new symptoms or if symptoms persist or worsen. Patient is aware when to return to the clinic for a follow-up visit. Patient educated on when it is appropriate to go to the emergency department.   Kattie Parrot, FNP-C [facility] [phone]

## 2024-01-05 ENCOUNTER — Telehealth (HOSPITAL_BASED_OUTPATIENT_CLINIC_OR_DEPARTMENT_OTHER): Payer: Self-pay

## 2024-01-05 ENCOUNTER — Encounter (HOSPITAL_BASED_OUTPATIENT_CLINIC_OR_DEPARTMENT_OTHER): Payer: Self-pay

## 2024-01-05 DIAGNOSIS — I10 Essential (primary) hypertension: Secondary | ICD-10-CM

## 2024-01-05 DIAGNOSIS — N183 Chronic kidney disease, stage 3 unspecified: Secondary | ICD-10-CM

## 2024-01-05 LAB — MICROALBUMIN / CREATININE URINE RATIO
Creatinine, Urine: 52.9 mg/dL
Microalb/Creat Ratio: 696 mg/g{creat} — ABNORMAL HIGH (ref 0–29)
Microalbumin, Urine: 368 ug/mL

## 2024-01-05 NOTE — Telephone Encounter (Signed)
-----   Message from Clearnce Curia sent at 01/03/2024  2:53 PM EDT ----- Kidney function significantly decreased from prior. Potassium mildly elevated. Ensure not taking potassium supplement.   Hold Entresto  for 2 days. Then resume at reduced dose 49-51mg  BID. Repeat BMET in 1 week for monitoring (likely will require reminder call). Ensure staying well hydrated.   Recommend referral to nephrology in [place] at [facility]. Phone [phone]. Fax [phone]]

## 2024-01-05 NOTE — Telephone Encounter (Signed)
 Called and spoke to pt. DOB verified.  Advised pt to stop taking potassium supplement. Discussed APP's recommendations and informed pt we will call to remind him of repeat labs. Referral sent for Nephrology referral; pt made aware. Pt verbalized understanding.

## 2024-01-10 DIAGNOSIS — D631 Anemia in chronic kidney disease: Secondary | ICD-10-CM | POA: Diagnosis not present

## 2024-01-10 DIAGNOSIS — N189 Chronic kidney disease, unspecified: Secondary | ICD-10-CM | POA: Diagnosis not present

## 2024-01-10 DIAGNOSIS — R809 Proteinuria, unspecified: Secondary | ICD-10-CM | POA: Diagnosis not present

## 2024-01-12 ENCOUNTER — Telehealth (HOSPITAL_BASED_OUTPATIENT_CLINIC_OR_DEPARTMENT_OTHER): Payer: Self-pay

## 2024-01-12 NOTE — Telephone Encounter (Signed)
-----   Message from Nurse Kennis Wissmann C sent at 01/05/2024  8:22 AM EDT ----- Please remind him to get BMP labs!!

## 2024-01-12 NOTE — Telephone Encounter (Signed)
 Called and spoke to pt of getting BMET. Pt stated, "I went and got labs on Wednesday at [facility] and sent blood over at [facility] too." Will check to see if labs are in. If not, pt made awar that he will need to get labs drawn for cardiology. Pt verbalized understanding.

## 2024-01-15 DIAGNOSIS — N182 Chronic kidney disease, stage 2 (mild): Secondary | ICD-10-CM | POA: Diagnosis not present

## 2024-01-15 DIAGNOSIS — E1129 Type 2 diabetes mellitus with other diabetic kidney complication: Secondary | ICD-10-CM | POA: Diagnosis not present

## 2024-01-15 DIAGNOSIS — N179 Acute kidney failure, unspecified: Secondary | ICD-10-CM | POA: Diagnosis not present

## 2024-01-15 DIAGNOSIS — E1122 Type 2 diabetes mellitus with diabetic chronic kidney disease: Secondary | ICD-10-CM | POA: Diagnosis not present

## 2024-01-15 LAB — CMP14+EGFR
ALT: 24 IU/L (ref 0–44)
AST: 22 IU/L (ref 0–40)
Albumin: 4.2 g/dL (ref 3.9–4.9)
Alkaline Phosphatase: 74 IU/L (ref 44–121)
BUN/Creatinine Ratio: 15 (ref 10–24)
BUN: 17 mg/dL (ref 8–27)
Bilirubin Total: <0.2 mg/dL (ref 0.0–1.2)
CO2: 21 mmol/L (ref 20–29)
Calcium: 9.6 mg/dL (ref 8.6–10.2)
Chloride: 103 mmol/L (ref 96–106)
Creatinine, Ser: 1.17 mg/dL (ref 0.76–1.27)
Globulin, Total: 2.7 g/dL (ref 1.5–4.5)
Glucose: 197 mg/dL — ABNORMAL HIGH (ref 70–99)
Potassium: 4.9 mmol/L (ref 3.5–5.2)
Sodium: 138 mmol/L (ref 134–144)
Total Protein: 6.9 g/dL (ref 6.0–8.5)
eGFR: 69 mL/min/{1.73_m2} (ref >59)

## 2024-01-15 LAB — CBC WITH DIFFERENTIAL/PLATELET
Basophils Absolute: 0.1 10*3/uL (ref 0.0–0.2)
Basos: 1 %
EOS (ABSOLUTE): 0.3 10*3/uL (ref 0.0–0.4)
Eos: 6 %
Hematocrit: 48.2 % (ref 37.5–51.0)
Hemoglobin: 15.9 g/dL (ref 13.0–17.7)
Immature Grans (Abs): 0 10*3/uL (ref 0.0–0.1)
Immature Granulocytes: 0 %
Lymphocytes Absolute: 1.6 10*3/uL (ref 0.7–3.1)
Lymphs: 29 %
MCH: 28 pg (ref 26.6–33.0)
MCHC: 33 g/dL (ref 31.5–35.7)
MCV: 85 fL (ref 79–97)
Monocytes Absolute: 0.4 10*3/uL (ref 0.1–0.9)
Monocytes: 8 %
Neutrophils Absolute: 3.1 10*3/uL (ref 1.4–7.0)
Neutrophils: 56 %
Platelets: 258 10*3/uL (ref 150–450)
RBC: 5.68 x10E6/uL (ref 4.14–5.80)
RDW: 13.4 % (ref 11.6–15.4)
WBC: 5.5 10*3/uL (ref 3.4–10.8)

## 2024-01-15 LAB — VITAMIN D 25 HYDROXY (VIT D DEFICIENCY, FRACTURES): Vit D, 25-Hydroxy: 21.5 ng/mL — ABNORMAL LOW (ref 30.0–100.0)

## 2024-01-15 LAB — TSH+T3+FREE T4+T3 FREE
Free T-3: 3.4 pg/mL
Free T4 by Dialysis: 1.1 ng/dL
TSH: 3.1 uU/mL
Triiodothyronine (T-3), Serum: 124 ng/dL

## 2024-01-15 LAB — LIPID PANEL
Chol/HDL Ratio: 4.9 ratio (ref 0.0–5.0)
Cholesterol, Total: 282 mg/dL — ABNORMAL HIGH (ref 100–199)
HDL: 57 mg/dL (ref >39)
LDL Chol Calc (NIH): 195 mg/dL — ABNORMAL HIGH (ref 0–99)
Triglycerides: 163 mg/dL — ABNORMAL HIGH (ref 0–149)
VLDL Cholesterol Cal: 30 mg/dL (ref 5–40)

## 2024-01-31 DIAGNOSIS — I130 Hypertensive heart and chronic kidney disease with heart failure and stage 1 through stage 4 chronic kidney disease, or unspecified chronic kidney disease: Secondary | ICD-10-CM | POA: Diagnosis not present

## 2024-01-31 DIAGNOSIS — N182 Chronic kidney disease, stage 2 (mild): Secondary | ICD-10-CM | POA: Diagnosis not present

## 2024-01-31 DIAGNOSIS — I5020 Unspecified systolic (congestive) heart failure: Secondary | ICD-10-CM | POA: Diagnosis not present

## 2024-01-31 DIAGNOSIS — E1122 Type 2 diabetes mellitus with diabetic chronic kidney disease: Secondary | ICD-10-CM | POA: Diagnosis not present

## 2024-01-31 DIAGNOSIS — E785 Hyperlipidemia, unspecified: Secondary | ICD-10-CM | POA: Diagnosis not present

## 2024-02-07 ENCOUNTER — Telehealth: Payer: Self-pay | Admitting: Pharmacist

## 2024-02-07 ENCOUNTER — Other Ambulatory Visit (INDEPENDENT_AMBULATORY_CARE_PROVIDER_SITE_OTHER)

## 2024-02-07 DIAGNOSIS — I5022 Chronic systolic (congestive) heart failure: Secondary | ICD-10-CM

## 2024-02-07 DIAGNOSIS — Z7984 Long term (current) use of oral hypoglycemic drugs: Secondary | ICD-10-CM

## 2024-02-07 DIAGNOSIS — E1169 Type 2 diabetes mellitus with other specified complication: Secondary | ICD-10-CM

## 2024-02-07 DIAGNOSIS — E785 Hyperlipidemia, unspecified: Secondary | ICD-10-CM

## 2024-02-07 MED ORDER — RYBELSUS 14 MG PO TABS: 14.0000 mg | ORAL_TABLET | Freq: Every day | ORAL | 3 refills | Status: DC

## 2024-02-07 NOTE — Telephone Encounter (Signed)
   This patient is appearing on a report for being at risk of failing the adherence measure for diabetes medications this calendar year.   Medication: Rybelsus  14 mg  Last fill date: 01/05/24 for 30 day supply A1c still uncontrolled at 8.1% on 01/04/24 Rybelsus  increased to 14mg  in April 2025  Left voicemail for patient to return my call at their convenience. and Will collaborate with provider to facilitate refill needs.   Marta Bouie Dattero Ella Golomb, PharmD, BCACP, CPP Clinical Pharmacist, [facility]

## 2024-02-07 NOTE — Progress Notes (Signed)
 02/07/2024 Name: Randy Sanchez MRN: [id number] DOB: 1956-07-05  Chief Complaint  Patient presents with   Diabetes   Hypertension   Hyperlipidemia   Congestive Heart Failure    Randy Sanchez is a 68 y.o. year old male who presented for a telephone visit. I connected with  [name] on 02/07/24 by telephone and verified that I am speaking with the correct person using two identifiers. I discussed the limitations of evaluation and management by telemedicine. The patient expressed understanding and agreed to proceed.    Patient was identified as falling into the [place] Measure - Diabetes.   Patient was: Referred to pharmacy for chronic disease management.   Care Team: Primary Care Provider: Galvin Jules, FNP ; Next Scheduled Visit: 02/08/24  Medication Access/Adherence  Current Pharmacy:  [facility] - [facility], [place] - [address] [place] [address] Phone: [phone] Fax: [phone]   Patient reports affordability concerns with their medications: No  Patient reports access/transportation concerns to their pharmacy: No  Patient reports adherence concerns with their medications:  Yes  - thought he was supposed to stop taking atorvastatin  and ezetimibe    Diabetes:  Current medications: Rybelsus  14 mg daily, Farxiga  10 mg daily, glipizide  5 mg BID with meals Medications tried in the past: Jardiance   Patient reports he is not currently checking his BG, but confirms he has supplies at home. Noted he has been busy and had increased personal stress lately.  Patient denies hypoglycemic s/sx including dizziness, shakiness, sweating. Patient denies hyperglycemic symptoms including polyuria, polydipsia, polyphagia, nocturia, neuropathy, blurred vision.   Hyperlipidemia/ASCVD Risk Reduction  Current lipid lowering medications: atorvastatin  80 mg daily, ezetimibe  10 mg daily  Patient reports he thought he was supposed to stop taking  atorvastatin  and ezetimibe . He ran out of atorvastatin , but still has some ezetimibe  at home.   Antiplatelet regimen: aspirin  81 mg daily  ASCVD History: NSTEMI, CAD s/p DES to Cx Risk Factors: DM, HTN, CKD, HF   Heart Failure (EF 40-45%):  Current medications:  ACEi/ARB/ARNI: Entresto  97/103 mg BID SGLT2i: Farxiga  10 mg daily Beta blocker: carvedilol  25 mg BID Mineralocorticoid Receptor Antagonist: none Diuretic regimen: none   Medication Management:  Patient reports good adherence to medications and denies missed doses or forgetting to take his medications. We reviewed his medication bottles over the phone. He had each of the medications listed below except atorvastatin  and vitamin D . He also had an old bottle of isosorbide  mononitrate, but he had not been taking it (discontinued in 2024 d/t ED, now has a prescription for sildenafil ). He confirmed he threw this bottle away while we were on the phone. Stated he does not use a pill box, but that his sister bought him one. Although he reports adherence, fill history shows potential non-adherence, but this may not be pulling accurately.  Recent fill dates:  Atorvastatin  - 10/04/23 for 30 DS Ezetimibe  - 06/24/23 for 90 DS Entresto  - 10/04/23 for 30 DS Carvedilol  - 01/05/24 for 30 DS Farxiga  - 01/05/24 for 30 DS Glipizide  - 10/04/23 for 30 DS Rybelsus  14 mg - 01/05/24 Vitamin D  50,000 units - not in fill history  Objective:  Lab Results  Component Value Date   HGBA1C 8.1 (H) 01/04/2024    Lab Results  Component Value Date   CREATININE 1.17 01/04/2024   BUN 17 01/04/2024   NA 138 01/04/2024   K 4.9 01/04/2024   CL 103 01/04/2024   CO2 21 01/04/2024  Lab Results  Component Value Date   CHOL 282 (H) 01/04/2024   HDL 57 01/04/2024   LDLCALC 195 (H) 01/04/2024   TRIG 163 (H) 01/04/2024   CHOLHDL 4.9 01/04/2024    Medications Reviewed Today     Reviewed by Philmore Bream, RPH (Pharmacist) on 02/07/24 at 1628  Med List  Status: <None>   Medication Order Taking? Sig Documenting Provider Last Dose Status Informant  Accu-Chek Softclix Lancets lancets [id number]  Test BS daily and 3x as needed Dx E11.9 Delfina Feller, FNP  Active   aspirin  EC 81 MG tablet [id number] Yes Take 81 mg by mouth daily. Swallow whole. [provider] Taking Active Self  atorvastatin  (LIPITOR ) 80 MG tablet [id number] No Take 1 tablet (80 mg total) by mouth daily.  Patient not taking: Reported on 02/07/2024   Galvin Jules, FNP Not Taking Active   Blood Glucose Monitoring Suppl (ACCU-CHEK GUIDE ME) w/Device KIT [id number]  Test BS daily and as needed Dx E11.9 Galvin Jules, FNP  Active   Blood Glucose Monitoring Suppl DEVI [id number]  1 each by Does not apply route in the morning, at noon, and at bedtime. May substitute to any manufacturer covered by patient's insurance. Galvin Jules, FNP  Active   Blood Pressure KIT [id number]  CHECK YOUR BLOOD PRESSURE TWICE A Lorene Roman, MD  Active   carvedilol  (COREG ) 25 MG tablet [id number] Yes Take 1 tablet (25 mg total) by mouth 2 (two) times daily with a meal. Walker, Caitlin S, NP Taking Active   dapagliflozin  propanediol (FARXIGA ) 10 MG TABS tablet [id number] Yes Take 1 tablet (10 mg total) by mouth daily before breakfast. Galvin Jules, FNP Taking Active   ezetimibe  (ZETIA ) 10 MG tablet [id number] No Take 1 tablet (10 mg total) by mouth daily.  Patient not taking: Reported on 02/07/2024   Clearnce Curia, NP Not Taking Active   glipiZIDE  (GLUCOTROL ) 5 MG tablet [id number] Yes Take 1 tablet (5 mg total) by mouth 2 (two) times daily before a meal. Rakes, Georgeann Kindred, FNP Taking Active   glucose blood (ACCU-CHEK GUIDE) test strip [id number]  Test BS daily and 3x as needed Dx E11.9 Delfina Feller, FNP  Active   nitroGLYCERIN  (NITROSTAT ) 0.4 MG SL tablet [id number]  Place 1 tablet (0.4 mg total) under the tongue every 5 (five) minutes x 3 doses as needed for chest pain. Clearnce Curia, NP  Active   sacubitril -valsartan  (ENTRESTO ) 97-103 MG [id number] Yes Take 1 tablet by mouth 2 (two) times daily. Clearnce Curia, NP Taking Active   Semaglutide  (RYBELSUS ) 14 MG TABS [id number] Yes Take 1 tablet (14 mg total) by mouth daily. Galvin Jules, FNP Taking Active   sildenafil  (VIAGRA ) 100 MG tablet [id number]  Take 1 tablet (100 mg total) by mouth daily as needed for erectile dysfunction. Galvin Jules, FNP  Active   Vitamin D , Ergocalciferol , (DRISDOL ) 1.25 MG (50000 UNIT) CAPS capsule [id number] No Take 1 capsule (50,000 Units total) by mouth every 7 (seven) days.  Patient not taking: Reported on 02/07/2024   Galvin Jules, FNP Not Taking Active               Assessment/Plan:   Diabetes: - Currently uncontrolled based on last A1C 8.1% on 01/04/24. Unable to assess glycemic control since Rybelsus  was increased as he has not been monitoring his BG. - Reviewed long term cardiovascular and renal outcomes of uncontrolled blood sugar - Reviewed goal A1c, goal fasting,  and goal 2 hour post prandial glucose - Recommend to continue current regimen - UACR 696 mg/g on 01/04/24 - discussed importance of glycemic control and hypertension control to protect his kidneys. He is currently treated with an SGLT-2 and ARB. - Patient denies personal or family history of multiple endocrine neoplasia type 2, medullary thyroid  cancer; personal history of pancreatitis or gallbladder disease. - Recommend to check fasting blood glucose daily   Hyperlipidemia/ASCVD Risk Reduction: - Currently uncontrolled wit last LDL 195 mg/dL, above goal <13 mg/dL given history of NSTEMI with risk factors DM, HTN, CKD, and HF. High intensity statin indicated for secondary prevention. Patient misunderstood and stopped all cholesterol medications. Instructed him to resume atorvastatin  and ezetimibe . - Reviewed long term complications of uncontrolled cholesterol - Recommend to restart atorvastatin  80 mg daily  and ezetimibe  10 mg daily  - Recommend to recheck lipid panel in 2-3 months    Heart Failure: - Heart failure currently managed by cardiology. BP has been elevated at last few office visits. Ideally he would benefit from addition of spironolactone  for HF GDMT and hypertension control, but most recent K was 4.9 on 01/04/24 and 5.3 on 01/02/24. Can re-evaluate in the future if K decreased. Consider addition of amlodipine  for hypertension control. Unable to discuss this today as the patient did not have much time to talk and we focused on making sure he had each of his medications. Will address at future pharmacist or PCP visits. - Recommend to continue Entresto  97/103 mg BID  - Recommend to continue Farxiga  10 mg daily - Recommend to continue carvedilol  25 mg BID    Medication Management: - Currently strategy insufficient to maintain appropriate adherence to prescribed medication regimen - Suggested use of weekly pill box to organize medications. He said he would start using the one his sister got him. - He said he would talk to his sister about getting refills of atorvastatin  and vitamin D  from the pharmacy.   Follow Up Plan: PCP tomorrow, PharmD on 02/29/24  Georga Killings, PharmD PGY-1 Pharmacy Resident

## 2024-02-08 ENCOUNTER — Encounter: Payer: Self-pay | Admitting: Family Medicine

## 2024-02-08 ENCOUNTER — Ambulatory Visit: Admitting: Family Medicine

## 2024-02-08 VITALS — BP 158/88 | HR 89 | Temp 97.1°F | Ht 64.0 in | Wt 162.2 lb

## 2024-02-08 DIAGNOSIS — Z0000 Encounter for general adult medical examination without abnormal findings: Secondary | ICD-10-CM | POA: Diagnosis not present

## 2024-02-08 NOTE — Progress Notes (Deleted)
 Complete physical exam  Patient: Randy Sanchez   DOB: [birth year]   68 y.o. Male  MRN: [id number]  Subjective:     Chief Complaint  Patient presents with   welcome to medicare    Randy Pruiett is a 68 y.o. male who presents today for a complete physical exam. He reports consuming a {diet types:17450} diet. {types:19826} He generally feels {DESC; WELL/FAIRLY WELL/POORLY:18703}. He reports sleeping {DESC; WELL/FAIRLY WELL/POORLY:18703}. He {does/does not:200015} have additional problems to discuss today.    Most recent fall risk assessment:    01/04/2024    8:47 AM  Fall Risk   Falls in the past year? 0  Number falls in past yr: 0  Injury with Fall? 0  Risk for fall due to : No Fall Risks  Follow up Falls evaluation completed     Most recent depression screenings:    01/04/2024    8:47 AM 10/04/2023    7:57 AM  PHQ 2/9 Scores  PHQ - 2 Score 0 0  PHQ- 9 Score 0 0    {VISON DENTAL STD PSA (Optional):27386}  {History (Optional):23778}  Patient Care Team: Galvin Jules, FNP as PCP - General (Family Medicine) Maudine Sos, MD as PCP - Cardiology (Cardiology)   Outpatient Medications Prior to Visit  Medication Sig   Accu-Chek Softclix Lancets lancets Test BS daily and 3x as needed Dx E11.9   aspirin  EC 81 MG tablet Take 81 mg by mouth daily. Swallow whole.   atorvastatin  (LIPITOR ) 80 MG tablet Take 1 tablet (80 mg total) by mouth daily.   Blood Glucose Monitoring Suppl (ACCU-CHEK GUIDE ME) w/Device KIT Test BS daily and as needed Dx E11.9   Blood Glucose Monitoring Suppl DEVI 1 each by Does not apply route in the morning, at noon, and at bedtime. May substitute to any manufacturer covered by patient's insurance.   Blood Pressure KIT CHECK YOUR BLOOD PRESSURE TWICE A DAY   carvedilol  (COREG ) 25 MG tablet Take 1 tablet (25 mg total) by mouth 2 (two) times daily with a meal.   dapagliflozin  propanediol (FARXIGA ) 10 MG TABS tablet Take 1 tablet (10 mg total) by mouth  daily before breakfast.   ezetimibe  (ZETIA ) 10 MG tablet Take 1 tablet (10 mg total) by mouth daily.   glipiZIDE  (GLUCOTROL ) 5 MG tablet Take 1 tablet (5 mg total) by mouth 2 (two) times daily before a meal.   glucose blood (ACCU-CHEK GUIDE) test strip Test BS daily and 3x as needed Dx E11.9   nitroGLYCERIN  (NITROSTAT ) 0.4 MG SL tablet Place 1 tablet (0.4 mg total) under the tongue every 5 (five) minutes x 3 doses as needed for chest pain.   sacubitril -valsartan  (ENTRESTO ) 97-103 MG Take 1 tablet by mouth 2 (two) times daily.   Semaglutide  (RYBELSUS ) 14 MG TABS Take 1 tablet (14 mg total) by mouth daily.   sildenafil  (VIAGRA ) 100 MG tablet Take 1 tablet (100 mg total) by mouth daily as needed for erectile dysfunction.   Vitamin D , Ergocalciferol , (DRISDOL ) 1.25 MG (50000 UNIT) CAPS capsule Take 1 capsule (50,000 Units total) by mouth every 7 (seven) days.   No facility-administered medications prior to visit.    ROS        Objective:     BP (!) 178/101   Pulse 89   Temp (!) 97.1 F (36.2 C)   Ht 5\' 4"  (1.626 m)   Wt 162 lb 3.2 oz (73.6 kg)   SpO2 97%   BMI 27.84 kg/m  {Vitals  History (Optional):23777}  Physical Exam   No results found for any visits on 02/08/24. {Show previous labs (optional):23779}    Assessment & Plan:    Routine Health Maintenance and Physical Exam  Immunization History  Administered Date(s) Administered   Influenza,inj,Quad PF,6+ Mos 05/20/2022   Influenza-Unspecified 05/06/2021, 04/03/2023   Moderna Sars-Covid-2 Vaccination 12/31/2019, 01/05/2020, 08/25/2020   PNEUMOCOCCAL CONJUGATE-20 07/04/2023   Tdap 08/19/2022   Zoster Recombinant(Shingrix ) 01/04/2024    Health Maintenance  Topic Date Due   Medicare Annual Wellness (AWV)  Never done   COVID-19 Vaccine (4 - 2024-25 season) 02/24/2024 (Originally 05/07/2023)   Zoster Vaccines- Shingrix  (2 of 2) 02/29/2024   OPHTHALMOLOGY EXAM  03/14/2024   INFLUENZA VACCINE  04/05/2024   HEMOGLOBIN A1C   07/06/2024   Diabetic kidney evaluation - eGFR measurement  01/03/2025   Diabetic kidney evaluation - Urine ACR  01/03/2025   FOOT EXAM  01/03/2025   Colonoscopy  04/05/2029   DTaP/Tdap/Td (2 - Td or Tdap) 08/19/2032   Pneumonia Vaccine 72+ Years old  Completed   Hepatitis C Screening  Completed   HIV Screening  Completed   HPV VACCINES  Aged Out   Meningococcal B Vaccine  Aged Out    Discussed health benefits of physical activity, and encouraged him to engage in regular exercise appropriate for his age and condition.  Problem List Items Addressed This Visit   None  Return in about 1 year (around 02/07/2025) for AWV.     Wynelle Heather, RMA

## 2024-02-08 NOTE — Progress Notes (Signed)
 Subjective:    Randy Sanchez is a 68 y.o. male who presents for a Welcome to Medicare exam.   Cardiac Risk Factors include: male gender;diabetes mellitus;hypertension;smoking/ tobacco exposure     Objective:    Today's Vitals   02/08/24 1359 02/08/24 1400  BP: (!) 179/103 (!) 178/101  Pulse: 89   Temp: (!) 97.1 F (36.2 C)   SpO2: 97%   Weight: 162 lb 3.2 oz (73.6 kg)   Height: 5\' 4"  (1.626 m)    Body mass index is 27.84 kg/m.  Medications Outpatient Encounter Medications as of 02/08/2024  Medication Sig   Accu-Chek Softclix Lancets lancets Test BS daily and 3x as needed Dx E11.9   aspirin  EC 81 MG tablet Take 81 mg by mouth daily. Swallow whole.   Blood Glucose Monitoring Suppl (ACCU-CHEK GUIDE ME) w/Device KIT Test BS daily and as needed Dx E11.9   Blood Glucose Monitoring Suppl DEVI 1 each by Does not apply route in the morning, at noon, and at bedtime. May substitute to any manufacturer covered by patient's insurance.   Blood Pressure KIT CHECK YOUR BLOOD PRESSURE TWICE A DAY   carvedilol  (COREG ) 25 MG tablet Take 1 tablet (25 mg total) by mouth 2 (two) times daily with a meal.   dapagliflozin  propanediol (FARXIGA ) 10 MG TABS tablet Take 1 tablet (10 mg total) by mouth daily before breakfast.   ezetimibe  (ZETIA ) 10 MG tablet Take 1 tablet (10 mg total) by mouth daily.   glipiZIDE  (GLUCOTROL ) 5 MG tablet Take 1 tablet (5 mg total) by mouth 2 (two) times daily before a meal.   glucose blood (ACCU-CHEK GUIDE) test strip Test BS daily and 3x as needed Dx E11.9   nitroGLYCERIN  (NITROSTAT ) 0.4 MG SL tablet Place 1 tablet (0.4 mg total) under the tongue every 5 (five) minutes x 3 doses as needed for chest pain.   sacubitril -valsartan  (ENTRESTO ) 97-103 MG Take 1 tablet by mouth 2 (two) times daily.   Semaglutide  (RYBELSUS ) 14 MG TABS Take 1 tablet (14 mg total) by mouth daily.   sildenafil  (VIAGRA ) 100 MG tablet Take 1 tablet (100 mg total) by mouth daily as needed for erectile  dysfunction.   Vitamin D , Ergocalciferol , (DRISDOL ) 1.25 MG (50000 UNIT) CAPS capsule Take 1 capsule (50,000 Units total) by mouth every 7 (seven) days.   [DISCONTINUED] atorvastatin  (LIPITOR ) 80 MG tablet Take 1 tablet (80 mg total) by mouth daily.   No facility-administered encounter medications on file as of 02/08/2024.     History: Past Medical History:  Diagnosis Date   Arthritis    Chronic headaches    Diabetes mellitus type II, non insulin  dependent (HCC) 07/28/2021   HTN, goal below 130/80 07/28/2021   Hyperlipidemia associated with type 2 diabetes mellitus (HCC), goal LDL < 70 07/28/2021   Ischemic cardiomyopathy 07/28/2021   NSTEMI (non-ST elevated myocardial infarction) (HCC) 07/27/2021   Tobacco abuse 12/20/2021   Past Surgical History:  Procedure Laterality Date   CORONARY STENT INTERVENTION N/A 07/27/2021   Procedure: CORONARY STENT INTERVENTION;  Surgeon: Lucendia Rusk, MD;  Location: [facility] INVASIVE CV LAB;  Service: Cardiovascular;  Laterality: N/A;   LEFT HEART CATH AND CORONARY ANGIOGRAPHY N/A 07/27/2021   Procedure: LEFT HEART CATH AND CORONARY ANGIOGRAPHY;  Surgeon: Lucendia Rusk, MD;  [facility] IN[facility] CV [facility];  Service: Cardiovascular;  Laterality: N/A;    Family History  Problem Relation Age of Onset   Stomach cancer Mother    Ovarian cancer Mother  Bursitis Mother    Arthritis Mother    Colitis Mother    Hypertension Mother    Stroke Father    Hypertension Father    Stroke Sister    Other Sister        died at birth   Hypertension Sister        died at birth   Other Sister    Stroke Sister    Hypertension Sister    Arthritis Sister    Anemia Sister    Hypertension Brother    Coronary artery disease Brother    Heart attack Brother    Alcoholism Brother    Arthritis Brother    Heart attack Paternal Grandmother    Heart attack Paternal Grandfather    Sleep apnea Neg Hx    Colon cancer Neg Hx    Rectal cancer Neg Hx    Social  History   Occupational History   Occupation: retired  Tobacco Use   Smoking status: Former    Current packs/day: 0.00    Types: Cigarettes    Quit date: 01/31/2022    Years since quitting: 2.0   Smokeless tobacco: Never  Vaping Use   Vaping status: Never Used  Substance and Sexual Activity   Alcohol use: Yes    Alcohol/week: 2.0 standard drinks of alcohol    Types: 2 Shots of liquor per week    Comment: 10-15 shot of Jack daniel every other day   Drug use: Not Currently    Types: Cocaine, Marijuana    Comment: last week   Sexual activity: Not Currently    Tobacco Counseling Counseling given: Yes   Immunizations and Health Maintenance Immunization History  Administered Date(s) Administered   Influenza,inj,Quad PF,6+ Mos 05/20/2022   Influenza-Unspecified 05/06/2021, 04/03/2023   Moderna Sars-Covid-2 Vaccination 12/31/2019, 01/05/2020, 08/25/2020   PNEUMOCOCCAL CONJUGATE-20 07/04/2023   Tdap 08/19/2022   Zoster Recombinant(Shingrix ) 01/04/2024     Activities of Daily Living    02/08/2024    2:01 PM  In your present state of health, do you have any difficulty performing the following activities:  Hearing? 0  Vision? 0  Difficulty concentrating or making decisions? 1  Walking or climbing stairs? 0  Dressing or bathing? 0  Doing errands, shopping? 0  Preparing Food and eating ? N  Using the Toilet? N  In the past six months, have you accidently leaked urine? N  Do you have problems with loss of bowel control? N  Managing your Medications? N  Managing your Finances? N  Housekeeping or managing your Housekeeping? N    Advanced Directives: Does Patient Have a Medical Advance Directive?: No Would patient like information on creating a medical advance directive?: No - Patient declined      Assessment:    This is a routine wellness  examination for this patient  Encounter for Medicare annual wellness exam   Vision/Hearing screen Not examined   Goals       DIET - REDUCE SODIUM INTAKE     Have 3 meals a day         Depression Screen    02/08/2024    2:16 PM 01/04/2024    8:47 AM 10/04/2023    7:57 AM 07/04/2023   10:00 AM  PHQ 2/9 Scores  PHQ - 2 Score 0 0 0 0  PHQ- 9 Score 1 0 0 0     Fall Risk    02/08/2024    2:16 PM  Fall Risk   Falls  in the past year? 0  Number falls in past yr: 0  Injury with Fall? 0  Risk for fall due to : No Fall Risks  Follow up Falls evaluation completed    Cognitive Function    02/08/2024    2:19 PM 02/08/2024    2:02 PM  MMSE - Mini Mental State Exam  Orientation to time 5 3  Orientation to Place 5 2  Registration 3 3  Attention/ Calculation 5 2  Recall 3 2  Language- name 2 objects 2 2  Language- repeat 1 0  Language- follow 3 step command 3 3  Language- read & follow direction 1 1  Write a sentence 1 1  Copy design 1 1  Total score 30 20        Patient Care Team: Galvin Jules, FNP as PCP - General (Family Medicine) Maudine Sos, MD as PCP - Cardiology (Cardiology)     Plan:  I have personally reviewed and noted the following in the patient's chart:   Medical and social history Use of alcohol, tobacco or illicit drugs  Current medications and supplements Functional ability and status Nutritional status Physical activity Advanced directives List of other physicians Hospitalizations, surgeries, and ER visits in previous 12 months Vitals Screenings to include cognitive, depression, and falls Referrals and appointments  In addition, I have reviewed and discussed with patient certain preventive protocols, quality metrics, and best practice recommendations. A written personalized care plan for preventive services as well as general preventive health recommendations were provided to patient.     Kattie Parrot, [place] 02/08/2024

## 2024-02-29 ENCOUNTER — Other Ambulatory Visit

## 2024-03-01 DIAGNOSIS — N182 Chronic kidney disease, stage 2 (mild): Secondary | ICD-10-CM | POA: Diagnosis not present

## 2024-03-06 ENCOUNTER — Telehealth: Payer: Self-pay | Admitting: Pharmacy Technician

## 2024-03-06 NOTE — Progress Notes (Signed)
   03/06/2024  Patient ID: Randy Sanchez, male   DOB: Dec 12, 1955, 68 y.o.   MRN: [id number]  Patient engaged with clinical pharmacist for management of diabetes on 02/07/2024. Outreach by [employer] technician was requested.   Outreached patient to discuss diabetes medication management. Unable to leave voicemail as a generic automated message came on saying voicemail box not set up, please try call again later, goodbye.  Gae Bihl, CPhT [facility] Office: [phone] Email: [email or web address]

## 2024-03-11 ENCOUNTER — Telehealth: Payer: Self-pay | Admitting: Pharmacy Technician

## 2024-03-11 DIAGNOSIS — I130 Hypertensive heart and chronic kidney disease with heart failure and stage 1 through stage 4 chronic kidney disease, or unspecified chronic kidney disease: Secondary | ICD-10-CM | POA: Diagnosis not present

## 2024-03-11 DIAGNOSIS — I2510 Atherosclerotic heart disease of native coronary artery without angina pectoris: Secondary | ICD-10-CM | POA: Diagnosis not present

## 2024-03-11 DIAGNOSIS — I5020 Unspecified systolic (congestive) heart failure: Secondary | ICD-10-CM | POA: Diagnosis not present

## 2024-03-11 DIAGNOSIS — E1122 Type 2 diabetes mellitus with diabetic chronic kidney disease: Secondary | ICD-10-CM | POA: Diagnosis not present

## 2024-03-11 DIAGNOSIS — N182 Chronic kidney disease, stage 2 (mild): Secondary | ICD-10-CM | POA: Diagnosis not present

## 2024-03-11 NOTE — Progress Notes (Signed)
   03/11/2024  Patient ID: Sherida Slicker, male   DOB: 1956/07/13, 68 y.o.   MRN: [id number]  Patient engaged with clinical pharmacist for management of diabetes on 02/07/2024. Outreach by [employer] technician was requested.   Outreached patient to discuss diabetes medication management. Voicemail NOT able to be left as an automated message came on stating voicemail box not set up, please try call again later, goodbye.  Yachet Mattson, CPhT [facility] Office: [phone] Email: [email or web address]

## 2024-03-14 ENCOUNTER — Telehealth: Payer: Self-pay | Admitting: Pharmacy Technician

## 2024-03-14 NOTE — Progress Notes (Signed)
 03/14/2024 Name: Randy Sanchez MRN: [id number] DOB: Jul 15, 1956  Patient is appearing on a report for [facility] [place] [facility] Diabetes and last engaged with the clinical pharmacist to discuss diabetes on 02/07/2024. Contacted patient today to discuss diabetes management and completed medication review.   Diabetes Plan from last clinical pharmacist appointment:  Diabetes: - Currently uncontrolled based on last A1C 8.1% on 01/04/24. Unable to assess glycemic control since Rybelsus  was increased as he has not been monitoring his BG. - Reviewed long term cardiovascular and renal outcomes of uncontrolled blood sugar - Reviewed goal A1c, goal fasting, and goal 2 hour post prandial glucose - Recommend to continue current regimen - UACR 696 mg/g on 01/04/24 - discussed importance of glycemic control and hypertension control to protect his kidneys. He is currently treated with an SGLT-2 and ARB. - Patient denies personal or family history of multiple endocrine neoplasia type 2, medullary thyroid  cancer; personal history of pancreatitis or gallbladder disease. - Recommend to check fasting blood glucose daily Hyperlipidemia/ASCVD Risk Reduction: - Currently uncontrolled wit last LDL 195 mg/dL, above goal <44 mg/dL given history of NSTEMI with risk factors DM, HTN, CKD, and HF. High intensity statin indicated for secondary prevention. Patient misunderstood and stopped all cholesterol medications. Instructed him to resume atorvastatin  and ezetimibe . - Reviewed long term complications of uncontrolled cholesterol - Recommend to restart atorvastatin  80 mg daily and ezetimibe  10 mg daily  - Recommend to recheck lipid panel in 2-3 months  Heart Failure: - Heart failure currently managed by cardiology. BP has been elevated at last few office visits. Ideally he would benefit from addition of spironolactone  for HF GDMT and hypertension control, but most recent K was 4.9 on 01/04/24 and 5.3 on 01/02/24. Can re-evaluate in the future  if K decreased. Consider addition of amlodipine  for hypertension control. Unable to discuss this today as the patient did not have much time to talk and we focused on making sure he had each of his medications. Will address at future pharmacist or PCP visits. - Recommend to continue Entresto  97/103 mg BID  - Recommend to continue Farxiga  10 mg daily - Recommend to continue carvedilol  25 mg BID  Medication Management: - Currently strategy insufficient to maintain appropriate adherence to prescribed medication regimen - Suggested use of weekly pill box to organize medications. He said he would start using the one his sister got him. - He said he would talk to his sister about getting refills of atorvastatin  and vitamin D  from the pharmacy. Follow Up Plan: PCP tomorrow, PharmD on 02/29/24   Medication Adherence Barriers Identified:  Patient made recommended medication changes per plan: Yes Patient informs he is taking Farixga 10mg  daily, Rubyelsus 14mg  daily and Glipizide  5mg  twice a day for diabetes.Per Dr Annemarie, these were all filled for 30 day supplies on 02/07/24.Encouraged patient to call in refills. Paitent informs he did restart the cholesterol medications Atorvastatin  and Ezetimibe  and informs he has this on hand.Per Dr Annemarie, Atorvastatin  last filled on 6/4 for 30 day supply and Ezetimibe  last filled on 06/24/23 for 90 day supply. Encourage patient to call in refills. Patient informs he is also taking Entresto  and Carvedilol  for Heart Failure and informs he has these on hand as well. Per Dr Annemarie, Carvedilol  last filled for 30 days supply on 6/4 and Entresto  last filled for 30 days supply on 10/04/23. Encourage patient to call in for refills. Access issues with any new medication or testing device: No Patient informs he has all his medications  on hand.  Patient is checking blood sugars as prescribed: No Patient informs he is NOT checking his blood sugars. He informs he doesn't have the time as he  is running back and forth to doctor appointments. Advised patient to check blood sugar at least once a day and write down the results to bring to his appointments as that would enable providers to treat him better. He informs he will start checking his blood sugars. Patient informs he has his sister help him with managing his medications.  Last A1c in May was 8.1. Upcoming PCP appointment on 04/09/24.  Medication Adherence Barriers Addressed/Actions Taken:  Reviewed medication changes per plan from last clinical pharmacist note Advised patient to call in medication refills prior to running out of medication. Reviewed instructions for monitoring blood sugars at home and reminded patient to keep a written log to review with pharmacist Reminded patient of date/time of upcoming clinical pharmacist follow up and any upcoming PCP/specialists visits. Patient denies transportation barriers to the appointment. Yes He informs his sister calls for transportation to his provider appoitnemtns.  Next clinical pharmacist appointment is scheduled for: TBD   Kate Caddy, CPhT [facility] Office: [phone] Email: [email or web address]

## 2024-03-21 ENCOUNTER — Telehealth: Payer: Self-pay

## 2024-03-21 NOTE — Progress Notes (Signed)
 Complex Care Management Care Guide Note  03/21/2024 Name: Randy Sanchez MRN: [id number] DOB: 1956-05-17  Randy Sanchez is a 69 y.o. year old male who is a primary care patient of Rakes, Rock HERO, FNP and is actively engaged with the care management team. I reached out to Randy Sanchez by phone today to assist with scheduling  with the Pharmacist.  Follow up plan: Unsuccessful telephone outreach attempt made. A HIPAA compliant phone message was left for the patient providing contact information and requesting a return call.  Randy Sanchez , RMA     [facility]  [facility], [facility] Guide  Direct Dial : [phone]  Website: [email or web address]

## 2024-03-29 DIAGNOSIS — H40013 Open angle with borderline findings, low risk, bilateral: Secondary | ICD-10-CM | POA: Diagnosis not present

## 2024-03-29 DIAGNOSIS — E119 Type 2 diabetes mellitus without complications: Secondary | ICD-10-CM | POA: Diagnosis not present

## 2024-03-29 DIAGNOSIS — H2513 Age-related nuclear cataract, bilateral: Secondary | ICD-10-CM | POA: Diagnosis not present

## 2024-03-29 DIAGNOSIS — H35033 Hypertensive retinopathy, bilateral: Secondary | ICD-10-CM | POA: Diagnosis not present

## 2024-03-29 LAB — HM DIABETES EYE EXAM

## 2024-04-08 ENCOUNTER — Ambulatory Visit: Payer: Self-pay

## 2024-04-08 NOTE — Telephone Encounter (Signed)
 Patient scheduled.

## 2024-04-08 NOTE — Telephone Encounter (Signed)
 FYI Only or Action Required?: FYI only for provider.  Patient was last seen in primary care on 02/08/2024 by Severa Rock HERO, FNP.  Called Nurse Triage reporting left arm pain.  Symptoms began 2 days ago .  Interventions attempted: Nothing.  Symptoms are: unchanged.  Triage Disposition: See PCP Within 2 Weeks  Patient/caregiver understands and will follow disposition?: no needs 5 days notification to get transportation. Refused to see any other provider other than PCP.             Copied from CRM [id number]. Topic: Clinical - Red Word Triage >> Apr 08, 2024  9:48 AM Randy Sanchez wrote: Red Word that prompted transfer to Nurse Triage:   Limited mobility in left arm possible  Caller is Randy Sanchez the patient's sister Reason for Disposition  [1] MILD pain (e.g., does not interfere with normal activities) AND [2] present > 7 days    2 days  Answer Assessment - Initial Assessment Questions 1. ONSET: When did the pain start?     2 days ago  2. LOCATION: Where is the pain located?     Left arm  3. PAIN: How bad is the pain? (Scale 0-10; or none, mild, moderate, severe)     Mild  4. WORK OR EXERCISE: Has there been any recent work or exercise that involved this part of the body?     yes 5. CAUSE: What do you think is causing the arm pain?     Using the arm not sure mechanism  6. OTHER SYMPTOMS: Do you have any other symptoms? (e.g., neck pain, swelling, rash, fever, numbness, weakness)     no  Protocols used: Arm Pain-A-AH

## 2024-04-09 ENCOUNTER — Ambulatory Visit: Admitting: Family Medicine

## 2024-04-11 NOTE — Progress Notes (Signed)
 Complex Care Management Care Guide Note  04/11/2024 Name: Randy Sanchez MRN: [id number] DOB: 1956/06/07  Randy Sanchez is a 68 y.o. year old male who is a primary care patient of Rakes, Rock HERO, FNP and is actively engaged with the care management team. I reached out to Sherida Slicker by phone today to assist with re-scheduling  with the Pharmacist.  Follow up plan: Telephone appointment with complex care management team member scheduled for:  04/26/2024  Jeoffrey Buffalo , RMA     [place]  [facility], [facility] Guide  Direct Dial : [phone]  Website: [email or web address]

## 2024-04-26 ENCOUNTER — Other Ambulatory Visit

## 2024-04-26 ENCOUNTER — Encounter: Payer: Self-pay | Admitting: Pharmacist

## 2024-04-26 DIAGNOSIS — I152 Hypertension secondary to endocrine disorders: Secondary | ICD-10-CM

## 2024-04-26 DIAGNOSIS — N183 Chronic kidney disease, stage 3 unspecified: Secondary | ICD-10-CM

## 2024-04-26 DIAGNOSIS — Z7984 Long term (current) use of oral hypoglycemic drugs: Secondary | ICD-10-CM

## 2024-04-26 DIAGNOSIS — E1169 Type 2 diabetes mellitus with other specified complication: Secondary | ICD-10-CM

## 2024-04-26 DIAGNOSIS — E1122 Type 2 diabetes mellitus with diabetic chronic kidney disease: Secondary | ICD-10-CM

## 2024-04-26 DIAGNOSIS — E119 Type 2 diabetes mellitus without complications: Secondary | ICD-10-CM

## 2024-04-26 DIAGNOSIS — E1159 Type 2 diabetes mellitus with other circulatory complications: Secondary | ICD-10-CM

## 2024-04-30 ENCOUNTER — Ambulatory Visit (INDEPENDENT_AMBULATORY_CARE_PROVIDER_SITE_OTHER): Admitting: Family Medicine

## 2024-04-30 ENCOUNTER — Encounter: Payer: Self-pay | Admitting: Family Medicine

## 2024-04-30 VITALS — BP 174/74 | HR 76 | Temp 97.6°F | Ht 64.0 in | Wt 158.2 lb

## 2024-04-30 DIAGNOSIS — N521 Erectile dysfunction due to diseases classified elsewhere: Secondary | ICD-10-CM | POA: Diagnosis not present

## 2024-04-30 DIAGNOSIS — M79602 Pain in left arm: Secondary | ICD-10-CM | POA: Diagnosis not present

## 2024-04-30 DIAGNOSIS — E1169 Type 2 diabetes mellitus with other specified complication: Secondary | ICD-10-CM | POA: Diagnosis not present

## 2024-04-30 MED ORDER — TADALAFIL 20 MG PO TABS: 10.0000 mg | ORAL_TABLET | ORAL | 11 refills | Status: AC | PRN

## 2024-04-30 NOTE — Progress Notes (Signed)
 Subjective:  Patient ID: Randy Sanchez, male    DOB: 01-10-1956, 68 y.o.   MRN: [id number]  Patient Care Team: Severa Rock HERO, FNP as PCP - General (Family Medicine) Raford Riggs, MD as PCP - Cardiology (Cardiology)   Chief Complaint:  Arm Swelling (Left arm swelling x 1 month )   HPI: Randy Sanchez is a 68 y.o. male presenting on 04/30/2024 for Arm Swelling (Left arm swelling x 1 month )  Randy Sanchez is a 68 year old male who presents with left arm pain and swelling.  He experienced pain and swelling in his left arm, which has since subsided. The swelling also affected his right arm, particularly around the elbows. He allowed the swelling in his arms to resolve on its own without medical intervention.  He is currently taking Viagra  at a dose of 100 mg, which he finds ineffective. He has previously tried Cialis  but found it slow to work, noting it only became effective early in the morning.          Relevant past medical, surgical, family, and social history reviewed and updated as indicated.  Allergies and medications reviewed and updated. Data reviewed: Chart in Epic.   Past Medical History:  Diagnosis Date   Arthritis    Chronic headaches    Diabetes mellitus type II, non insulin  dependent (HCC) 07/28/2021   HTN, goal below 130/80 07/28/2021   Hyperlipidemia associated with type 2 diabetes mellitus (HCC), goal LDL < 70 07/28/2021   Ischemic cardiomyopathy 07/28/2021   NSTEMI (non-ST elevated myocardial infarction) (HCC) 07/27/2021   Tobacco abuse 12/20/2021    Past Surgical History:  Procedure Laterality Date   CORONARY STENT INTERVENTION N/A 07/27/2021   Procedure: CORONARY STENT INTERVENTION;  Surgeon: Dann Candyce RAMAN, MD;  Location: [facility] INVASIVE CV LAB;  Service: Cardiovascular;  Laterality: N/A;   LEFT HEART CATH AND CORONARY ANGIOGRAPHY N/A 07/27/2021   Procedure: LEFT HEART CATH AND CORONARY ANGIOGRAPHY;  Surgeon: Dann Candyce RAMAN, MD;   Location: [facility] INVASIVE CV LAB;  Service: Cardiovascular;  Laterality: N/A;    Social History   Socioeconomic History   Marital status: Widowed    Spouse name: Not on file   Number of children: 0   Years of education: Not on file   Highest education level: Not on file  Occupational History   Occupation: retired  Tobacco Use   Smoking status: Former    Current packs/day: 0.00    Types: Cigarettes    Quit date: 01/31/2022    Years since quitting: 2.2   Smokeless tobacco: Never  Vaping Use   Vaping status: Never Used  Substance and Sexual Activity   Alcohol use: Yes    Alcohol/week: 2.0 standard drinks of alcohol    Types: 2 Shots of liquor per week    Comment: 10-15 shot of Marinell daniel every other day   Drug use: Not Currently    Types: Cocaine, Marijuana    Comment: last week   Sexual activity: Not Currently  Other Topics Concern   Not on file  Social History Narrative   Not on file   Social Drivers of Health   Financial Resource Strain: Not on file  Food Insecurity: Not on file  Transportation Needs: Not on file  Physical Activity: Not on file  Stress: Not on file  Social Connections: Not on file  Intimate Partner Violence: Not on file    Outpatient Encounter Medications as of 04/30/2024  Medication Sig   Accu-Chek  Softclix Lancets lancets Test BS daily and 3x as needed Dx E11.9   aspirin  EC 81 MG tablet Take 81 mg by mouth daily. Swallow whole.   atorvastatin  (LIPITOR ) 80 MG tablet Take 80 mg by mouth daily.   Blood Glucose Monitoring Suppl (ACCU-CHEK GUIDE ME) w/Device KIT Test BS daily and as needed Dx E11.9   Blood Glucose Monitoring Suppl DEVI 1 each by Does not apply route in the morning, at noon, and at bedtime. May substitute to any manufacturer covered by patient's insurance.   Blood Pressure KIT CHECK YOUR BLOOD PRESSURE TWICE A DAY   carvedilol  (COREG ) 25 MG tablet Take 1 tablet (25 mg total) by mouth 2 (two) times daily with a meal.   glipiZIDE   (GLUCOTROL ) 5 MG tablet Take 1 tablet (5 mg total) by mouth 2 (two) times daily before a meal.   glucose blood (ACCU-CHEK GUIDE) test strip Test BS daily and 3x as needed Dx E11.9   nitroGLYCERIN  (NITROSTAT ) 0.4 MG SL tablet Place 1 tablet (0.4 mg total) under the tongue every 5 (five) minutes x 3 doses as needed for chest pain.   sacubitril -valsartan  (ENTRESTO ) 97-103 MG Take 1 tablet by mouth 2 (two) times daily.   Semaglutide  (RYBELSUS ) 14 MG TABS Take 1 tablet (14 mg total) by mouth daily.   tadalafil  (CIALIS ) 20 MG tablet Take 0.5-1 tablets (10-20 mg total) by mouth every other day as needed for erectile dysfunction.   dapagliflozin  propanediol (FARXIGA ) 10 MG TABS tablet Take 1 tablet (10 mg total) by mouth daily before breakfast. (Patient not taking: Reported on 04/30/2024)   ezetimibe  (ZETIA ) 10 MG tablet Take 1 tablet (10 mg total) by mouth daily. (Patient not taking: Reported on 04/30/2024)   Vitamin D , Ergocalciferol , (DRISDOL ) 1.25 MG (50000 UNIT) CAPS capsule Take 1 capsule (50,000 Units total) by mouth every 7 (seven) days. (Patient not taking: Reported on 04/30/2024)   [DISCONTINUED] sildenafil  (VIAGRA ) 100 MG tablet Take 1 tablet (100 mg total) by mouth daily as needed for erectile dysfunction. (Patient not taking: Reported on 04/30/2024)   No facility-administered encounter medications on file as of 04/30/2024.    Allergies  Allergen Reactions   Jardiance  [Empagliflozin ] Other (See Comments)    Yeast infections; states he can take farxiga     Pertinent ROS per HPI, otherwise unremarkable      Objective:  BP (!) 174/74   Pulse 76   Temp 97.6 F (36.4 C)   Ht 5' 4 (1.626 m)   Wt 158 lb 3.2 oz (71.8 kg)   SpO2 98%   BMI 27.15 kg/m    Wt Readings from Last 3 Encounters:  04/30/24 158 lb 3.2 oz (71.8 kg)  02/08/24 162 lb 3.2 oz (73.6 kg)  01/04/24 164 lb 12.8 oz (74.8 kg)    Physical Exam Vitals and nursing note reviewed.  Constitutional:      Appearance: Normal  appearance. He is normal weight.  HENT:     Head: Normocephalic and atraumatic.     Mouth/Throat:     Mouth: Mucous membranes are moist.  Eyes:     Pupils: Pupils are equal, round, and reactive to light.  Cardiovascular:     Rate and Rhythm: Normal rate and regular rhythm.     Heart sounds: Normal heart sounds.  Pulmonary:     Effort: Pulmonary effort is normal.     Breath sounds: Normal breath sounds.  Musculoskeletal:     Cervical back: Neck supple.     Right lower leg:  No edema.     Left lower leg: No edema.  Skin:    General: Skin is warm and dry.     Capillary Refill: Capillary refill takes less than 2 seconds.  Neurological:     General: No focal deficit present.     Mental Status: He is alert and oriented to person, place, and time.  Psychiatric:        Mood and Affect: Mood normal.        Behavior: Behavior normal.        Thought Content: Thought content normal.        Judgment: Judgment normal.     Results for orders placed or performed in visit on 04/08/24  HM DIABETES EYE EXAM   Collection Time: 03/29/24  9:34 AM  Result Value Ref Range   HM Diabetic Eye Exam No Retinopathy No Retinopathy       Pertinent labs & imaging results that were available during my care of the patient were reviewed by me and considered in my medical decision making.  Assessment & Plan:  Briyan was seen today for arm swelling.  Diagnoses and all orders for this visit:  Erectile dysfunction due to diabetes mellitus (HCC) -     tadalafil  (CIALIS ) 20 MG tablet; Take 0.5-1 tablets (10-20 mg total) by mouth every other day as needed for erectile dysfunction.  Elbow pain resolved     Erectile dysfunction Chronic erectile dysfunction not adequately managed with current treatment of Viagra  100 mg, which is the maximum dose. Reports that Viagra  is ineffective. Previous trial of Cialis  was not satisfactory due to delayed onset of action. Discussed alternative treatment options, including  the use of Cialis  20 mg daily, which may provide more consistent results. Informed about the potential for drug interactions, specifically with nitroglycerin , and the need to seek medical attention if significant symptoms develop. - Print prescription for Cialis  20 mg, 10 tablets, to be taken once daily. - Advise to compare prices at different pharmacies, including mail order options, to find the most affordable option. - Instruct not to take nitroglycerin  with Cialis . - Advise to seek medical attention if significant symptoms develop. - Follow up if Cialis  is ineffective.  Bilateral elbow bursitis Reports previous swelling in both elbows, consistent with bursitis.          Continue all other maintenance medications.  Follow up plan: Return if symptoms worsen or fail to improve.   Continue healthy lifestyle choices, including diet (rich in fruits, vegetables, and lean proteins, and low in salt and simple carbohydrates) and exercise (at least 30 minutes of moderate physical activity daily).   The above assessment and management plan was discussed with the patient. The patient verbalized understanding of and has agreed to the management plan. Patient is aware to call the clinic if they develop any new symptoms or if symptoms persist or worsen. Patient is aware when to return to the clinic for a follow-up visit. Patient educated on when it is appropriate to go to the emergency department.   Rosaline Bruns, FNP-C [facility] [phone]

## 2024-05-10 MED ORDER — RYBELSUS 14 MG PO TABS: 14.0000 mg | ORAL_TABLET | Freq: Every day | ORAL | 3 refills | Status: AC

## 2024-05-10 MED ORDER — DAPAGLIFLOZIN PROPANEDIOL 10 MG PO TABS: 10.0000 mg | ORAL_TABLET | Freq: Every day | ORAL | 6 refills | Status: AC

## 2024-05-10 NOTE — Progress Notes (Signed)
 05/10/2024 Name: Randy Sanchez MRN: [id number] DOB: 14-Apr-1956  No chief complaint on file.   Randy Sanchez is a 68 y.o. year old male who presented for a telephone visit.   They were referred to the pharmacist by their PCP for assistance in managing diabetes.    Subjective:  Care Team: Primary Care Provider: Severa Rock HERO, FNP   Medication Access/Adherence  Current Pharmacy:  [facility] [place] [place] - [address] [place] [address] Phone: [phone] Fax: [phone]   Patient reports affordability concerns with their medications: Yes  Patient reports access/transportation concerns to their pharmacy: No  Patient reports adherence concerns with their medications:  No     Diabetes:  Current medications: rybelsus , farxiga , glipizide  (refuses injectable) Medications tried in the past: metformin  Current glucose readings: not checking  Patient denies hypoglycemic s/sx including dizziness, shakiness, sweating. Patient denies hyperglycemic symptoms including polyuria, polydipsia, polyphagia, nocturia, neuropathy, blurred vision.  Current meal patterns:  Discussed meal planning options and Plate method for healthy eating Avoid sugary drinks and desserts Incorporate balanced protein, non starchy veggies, 1 serving of carbohydrate with each meal Increase water intake Increase physical activity as able   Current medication access support: medicaid  Macrovascular and Microvascular Risk Reduction:  Statin? yes (needs PCSK9); ACEi/ARB? On entresto  Last urinary albumin/creatinine ratio:  Lab Results  Component Value Date   MICRALBCREAT 696 (H) 01/04/2024   MICRALBCREAT 260 (H) 11/18/2022   MICRALBCREAT 295 (H) 11/16/2021   MICRALBCREAT 279 (H) 10/19/2021   Last eye exam:  Lab Results  Component Value Date   HMDIABEYEEXA No Retinopathy 03/29/2024   Last foot exam: 01/04/2024 Tobacco Use:  Tobacco Use: Medium Risk (04/30/2024)    Patient History    Smoking Tobacco Use: Former    Smokeless Tobacco Use: Never    Passive Exposure: Not on file     Objective:  Lab Results  Component Value Date   HGBA1C 8.1 (H) 01/04/2024    Lab Results  Component Value Date   CREATININE 1.17 01/04/2024   BUN 17 01/04/2024   NA 138 01/04/2024   K 4.9 01/04/2024   CL 103 01/04/2024   CO2 21 01/04/2024    Lab Results  Component Value Date   CHOL 282 (H) 01/04/2024   HDL 57 01/04/2024   LDLCALC 195 (H) 01/04/2024   TRIG 163 (H) 01/04/2024   CHOLHDL 4.9 01/04/2024    Medications Reviewed Today   Medications were not reviewed in this encounter      Assessment/Plan:   Diabetes: - Currently uncontrolled; goal A1c <7%. Has stayed at 8.1% since 09/2023. Cardiorenal risk reduction is opportunities for improvement.. Blood pressure is not at goal <130/80. LDL is not at goal.  - Reviewed long term cardiovascular and renal outcomes of uncontrolled blood sugar., Reviewed goal A1c, goal fasting, and goal 2 hour post prandial glucose. Recommended to check glucose fasting or if symptomatic , and Reviewed dietary modifications including healthy plate method. - Recommend to continue Rybelsus  Farxiga  and glipizide --patient refuses injection. - Patient denies personal or family history of multiple endocrine neoplasia type 2, medullary thyroid  cancer; personal history of pancreatitis or gallbladder disease., Discussed side effects of gastrointestinal upset/nausea; eating smaller meals, avoiding high-fat foods, and remaining upright after eating may reduce nausea. Discussed that overeating is a major trigger of nausea with this class of medications, as often times patients will start to feel full sooner and may need to decrease portion sizes from what  they were previously accustomed to. , Discussed potential side effects of dehydration, genitourinary infections., Encouraged adequate hydration and genital hygiene.   Follow Up Plan: 3 weeks    Mliss Tarry Griffin, PharmD, BCACP, CPP Clinical Pharmacist, [facility]

## 2024-05-15 DIAGNOSIS — I10 Essential (primary) hypertension: Secondary | ICD-10-CM | POA: Diagnosis not present

## 2024-05-15 DIAGNOSIS — N189 Chronic kidney disease, unspecified: Secondary | ICD-10-CM | POA: Diagnosis not present

## 2024-05-15 DIAGNOSIS — E211 Secondary hyperparathyroidism, not elsewhere classified: Secondary | ICD-10-CM | POA: Diagnosis not present

## 2024-05-15 DIAGNOSIS — E119 Type 2 diabetes mellitus without complications: Secondary | ICD-10-CM | POA: Diagnosis not present

## 2024-05-15 DIAGNOSIS — R809 Proteinuria, unspecified: Secondary | ICD-10-CM | POA: Diagnosis not present

## 2024-05-15 DIAGNOSIS — D631 Anemia in chronic kidney disease: Secondary | ICD-10-CM | POA: Diagnosis not present

## 2024-05-16 ENCOUNTER — Other Ambulatory Visit

## 2024-05-23 DIAGNOSIS — E1129 Type 2 diabetes mellitus with other diabetic kidney complication: Secondary | ICD-10-CM | POA: Diagnosis not present

## 2024-05-23 DIAGNOSIS — E1122 Type 2 diabetes mellitus with diabetic chronic kidney disease: Secondary | ICD-10-CM | POA: Diagnosis not present

## 2024-05-23 DIAGNOSIS — N182 Chronic kidney disease, stage 2 (mild): Secondary | ICD-10-CM | POA: Diagnosis not present

## 2024-05-23 DIAGNOSIS — I129 Hypertensive chronic kidney disease with stage 1 through stage 4 chronic kidney disease, or unspecified chronic kidney disease: Secondary | ICD-10-CM | POA: Diagnosis not present

## 2024-06-27 DIAGNOSIS — M1990 Unspecified osteoarthritis, unspecified site: Secondary | ICD-10-CM | POA: Diagnosis not present

## 2024-06-27 DIAGNOSIS — I10 Essential (primary) hypertension: Secondary | ICD-10-CM | POA: Diagnosis not present

## 2024-06-27 DIAGNOSIS — E785 Hyperlipidemia, unspecified: Secondary | ICD-10-CM | POA: Diagnosis not present

## 2024-06-27 DIAGNOSIS — E119 Type 2 diabetes mellitus without complications: Secondary | ICD-10-CM | POA: Diagnosis not present

## 2024-06-27 DIAGNOSIS — I252 Old myocardial infarction: Secondary | ICD-10-CM | POA: Diagnosis not present

## 2024-06-27 DIAGNOSIS — I25119 Atherosclerotic heart disease of native coronary artery with unspecified angina pectoris: Secondary | ICD-10-CM | POA: Diagnosis not present

## 2024-06-27 DIAGNOSIS — Z7984 Long term (current) use of oral hypoglycemic drugs: Secondary | ICD-10-CM | POA: Diagnosis not present

## 2024-06-27 DIAGNOSIS — Z87891 Personal history of nicotine dependence: Secondary | ICD-10-CM | POA: Diagnosis not present

## 2024-06-27 DIAGNOSIS — R32 Unspecified urinary incontinence: Secondary | ICD-10-CM | POA: Diagnosis not present

## 2024-07-23 DIAGNOSIS — H524 Presbyopia: Secondary | ICD-10-CM | POA: Diagnosis not present

## 2024-07-23 DIAGNOSIS — H5203 Hypermetropia, bilateral: Secondary | ICD-10-CM | POA: Diagnosis not present

## 2024-07-23 DIAGNOSIS — H2513 Age-related nuclear cataract, bilateral: Secondary | ICD-10-CM | POA: Diagnosis not present

## 2024-07-23 DIAGNOSIS — H52223 Regular astigmatism, bilateral: Secondary | ICD-10-CM | POA: Diagnosis not present

## 2024-07-23 DIAGNOSIS — H40023 Open angle with borderline findings, high risk, bilateral: Secondary | ICD-10-CM | POA: Diagnosis not present

## 2024-07-23 DIAGNOSIS — E119 Type 2 diabetes mellitus without complications: Secondary | ICD-10-CM | POA: Diagnosis not present

## 2024-07-23 LAB — OPHTHALMOLOGY REPORT-SCANNED

## 2024-07-26 ENCOUNTER — Other Ambulatory Visit: Payer: Self-pay | Admitting: Family Medicine

## 2024-07-26 DIAGNOSIS — N522 Drug-induced erectile dysfunction: Secondary | ICD-10-CM

## 2024-08-08 ENCOUNTER — Telehealth: Payer: Self-pay

## 2024-08-08 NOTE — Telephone Encounter (Signed)
 Patient was identified as falling into the [place] Measure - Diabetes.   Patient was: Requires a call back at a later time. No answer, no VM.

## 2024-08-15 ENCOUNTER — Telehealth: Payer: Self-pay

## 2024-08-15 NOTE — Telephone Encounter (Signed)
 Patient was identified as falling into the [place] Measure - Diabetes.   Patient was: Appointment scheduled with primary care provider in the next 30 days.

## 2024-08-19 ENCOUNTER — Telehealth: Payer: Self-pay

## 2024-08-19 NOTE — Telephone Encounter (Signed)
 Patient was identified as falling into the [place] Measure - Diabetes.   Patient was: Appointment already scheduled for:  12/17.

## 2024-08-21 ENCOUNTER — Ambulatory Visit: Admitting: Family Medicine

## 2024-08-21 ENCOUNTER — Encounter: Payer: Self-pay | Admitting: Family Medicine

## 2024-08-21 DIAGNOSIS — E559 Vitamin D deficiency, unspecified: Secondary | ICD-10-CM

## 2024-08-21 DIAGNOSIS — E1169 Type 2 diabetes mellitus with other specified complication: Secondary | ICD-10-CM

## 2024-08-21 DIAGNOSIS — E1122 Type 2 diabetes mellitus with diabetic chronic kidney disease: Secondary | ICD-10-CM

## 2024-09-25 ENCOUNTER — Other Ambulatory Visit (HOSPITAL_BASED_OUTPATIENT_CLINIC_OR_DEPARTMENT_OTHER): Payer: Self-pay | Admitting: Family

## 2024-09-25 DIAGNOSIS — I25118 Atherosclerotic heart disease of native coronary artery with other forms of angina pectoris: Secondary | ICD-10-CM

## 2024-09-25 DIAGNOSIS — I5022 Chronic systolic (congestive) heart failure: Secondary | ICD-10-CM

## 2024-09-25 DIAGNOSIS — I255 Ischemic cardiomyopathy: Secondary | ICD-10-CM

## 2024-09-25 DIAGNOSIS — I10 Essential (primary) hypertension: Secondary | ICD-10-CM

## 2025-02-11 ENCOUNTER — Ambulatory Visit: Payer: Self-pay
